# Patient Record
Sex: Male | Born: 1940
Health system: Southern US, Community
[De-identification: ages and names within clinical notes are randomized; demographics above are authoritative.]

## PROBLEM LIST (undated history)

## (undated) DIAGNOSIS — K219 Gastro-esophageal reflux disease without esophagitis: Secondary | ICD-10-CM

## (undated) DIAGNOSIS — I482 Chronic atrial fibrillation, unspecified: Secondary | ICD-10-CM

## (undated) DIAGNOSIS — N183 Chronic kidney disease, stage 3 unspecified: Secondary | ICD-10-CM

## (undated) DIAGNOSIS — R053 Chronic cough: Secondary | ICD-10-CM

## (undated) DIAGNOSIS — J9601 Acute respiratory failure with hypoxia: Secondary | ICD-10-CM

## (undated) DIAGNOSIS — I5032 Chronic diastolic (congestive) heart failure: Secondary | ICD-10-CM

## (undated) DIAGNOSIS — G4733 Obstructive sleep apnea (adult) (pediatric): Secondary | ICD-10-CM

## (undated) DIAGNOSIS — D649 Anemia, unspecified: Secondary | ICD-10-CM

## (undated) DIAGNOSIS — I469 Cardiac arrest, cause unspecified: Secondary | ICD-10-CM

## (undated) DIAGNOSIS — E662 Morbid (severe) obesity with alveolar hypoventilation: Secondary | ICD-10-CM

## (undated) DIAGNOSIS — I442 Atrioventricular block, complete: Secondary | ICD-10-CM

## (undated) DIAGNOSIS — R05 Cough: Secondary | ICD-10-CM

## (undated) DIAGNOSIS — Z95810 Presence of automatic (implantable) cardiac defibrillator: Secondary | ICD-10-CM

## (undated) DIAGNOSIS — I10 Essential (primary) hypertension: Secondary | ICD-10-CM

## (undated) DIAGNOSIS — Z87828 Personal history of other (healed) physical injury and trauma: Secondary | ICD-10-CM

## (undated) DIAGNOSIS — E785 Hyperlipidemia, unspecified: Secondary | ICD-10-CM

## (undated) DIAGNOSIS — I872 Venous insufficiency (chronic) (peripheral): Secondary | ICD-10-CM

## (undated) DIAGNOSIS — Z8781 Personal history of (healed) traumatic fracture: Secondary | ICD-10-CM

## (undated) HISTORY — DX: Chronic cough: R05.3

## (undated) HISTORY — DX: Personal history of other (healed) physical injury and trauma: Z87.828

## (undated) HISTORY — PX: TRANSTHORACIC ECHOCARDIOGRAM: SHX275

## (undated) HISTORY — DX: Essential (primary) hypertension: I10

## (undated) HISTORY — DX: Hyperlipidemia, unspecified: E78.5

## (undated) HISTORY — DX: Chronic kidney disease, stage 3 (moderate): N18.3

## (undated) HISTORY — PX: INSERT / REPLACE / REMOVE PACEMAKER: SUR710

## (undated) HISTORY — DX: Chronic diastolic (congestive) heart failure: I50.32

## (undated) HISTORY — DX: Venous insufficiency (chronic) (peripheral): I87.2

## (undated) HISTORY — DX: Atrioventricular block, complete: I44.2

## (undated) HISTORY — DX: Chronic atrial fibrillation, unspecified: I48.20

## (undated) HISTORY — PX: AV NODE ABLATION: EP1193

## (undated) HISTORY — DX: Cough: R05

## (undated) HISTORY — DX: Anemia, unspecified: D64.9

## (undated) HISTORY — DX: Cardiac arrest, cause unspecified: I46.9

## (undated) HISTORY — DX: Personal history of (healed) traumatic fracture: Z87.81

## (undated) HISTORY — DX: Gastro-esophageal reflux disease without esophagitis: K21.9

## (undated) HISTORY — DX: Presence of automatic (implantable) cardiac defibrillator: Z95.810

## (undated) HISTORY — DX: Obstructive sleep apnea (adult) (pediatric): G47.33

## (undated) HISTORY — DX: Morbid (severe) obesity with alveolar hypoventilation: E66.2

## (undated) HISTORY — DX: Chronic kidney disease, stage 3 unspecified: N18.30

---

## 1978-05-21 DIAGNOSIS — Z8781 Personal history of (healed) traumatic fracture: Secondary | ICD-10-CM

## 1978-05-21 HISTORY — DX: Personal history of (healed) traumatic fracture: Z87.81

## 1998-05-21 DIAGNOSIS — I469 Cardiac arrest, cause unspecified: Secondary | ICD-10-CM

## 1998-05-21 HISTORY — DX: Cardiac arrest, cause unspecified: I46.9

## 1998-06-29 ENCOUNTER — Encounter: Payer: Self-pay | Admitting: Emergency Medicine

## 1998-06-29 ENCOUNTER — Inpatient Hospital Stay (HOSPITAL_COMMUNITY): Admission: EM | Admit: 1998-06-29 | Discharge: 1998-07-22 | Payer: Self-pay | Admitting: Emergency Medicine

## 1998-07-01 ENCOUNTER — Encounter: Payer: Self-pay | Admitting: Internal Medicine

## 1998-07-02 ENCOUNTER — Encounter: Payer: Self-pay | Admitting: Cardiovascular Disease

## 1998-07-13 ENCOUNTER — Encounter: Payer: Self-pay | Admitting: Pulmonary Disease

## 1999-04-09 ENCOUNTER — Encounter: Payer: Self-pay | Admitting: Emergency Medicine

## 1999-04-10 ENCOUNTER — Inpatient Hospital Stay (HOSPITAL_COMMUNITY): Admission: EM | Admit: 1999-04-10 | Discharge: 1999-04-10 | Payer: Self-pay | Admitting: Emergency Medicine

## 1999-05-21 ENCOUNTER — Emergency Department (HOSPITAL_COMMUNITY): Admission: EM | Admit: 1999-05-21 | Discharge: 1999-05-21 | Payer: Self-pay | Admitting: Emergency Medicine

## 1999-05-30 ENCOUNTER — Ambulatory Visit (HOSPITAL_COMMUNITY): Admission: RE | Admit: 1999-05-30 | Discharge: 1999-05-31 | Payer: Self-pay | Admitting: Internal Medicine

## 2000-11-27 ENCOUNTER — Encounter: Admission: RE | Admit: 2000-11-27 | Discharge: 2000-11-27 | Payer: Self-pay | Admitting: Urology

## 2000-11-27 ENCOUNTER — Encounter: Payer: Self-pay | Admitting: Urology

## 2003-02-02 ENCOUNTER — Ambulatory Visit (HOSPITAL_COMMUNITY): Admission: RE | Admit: 2003-02-02 | Discharge: 2003-02-03 | Payer: Self-pay | Admitting: Cardiovascular Disease

## 2005-10-03 ENCOUNTER — Ambulatory Visit (HOSPITAL_COMMUNITY): Admission: RE | Admit: 2005-10-03 | Discharge: 2005-10-03 | Payer: Self-pay | Admitting: Cardiovascular Disease

## 2006-05-21 HISTORY — PX: COLONOSCOPY W/ POLYPECTOMY: SHX1380

## 2006-05-21 HISTORY — PX: ESOPHAGOGASTRODUODENOSCOPY: SHX1529

## 2006-08-02 ENCOUNTER — Encounter (INDEPENDENT_AMBULATORY_CARE_PROVIDER_SITE_OTHER): Payer: Self-pay | Admitting: Specialist

## 2006-08-02 ENCOUNTER — Ambulatory Visit (HOSPITAL_COMMUNITY): Admission: RE | Admit: 2006-08-02 | Discharge: 2006-08-02 | Payer: Self-pay | Admitting: Gastroenterology

## 2006-10-30 ENCOUNTER — Ambulatory Visit: Payer: Self-pay | Admitting: Internal Medicine

## 2006-11-04 ENCOUNTER — Ambulatory Visit: Payer: Self-pay | Admitting: Internal Medicine

## 2006-11-04 LAB — CONVERTED CEMR LAB
BUN: 15 mg/dL (ref 6–23)
Basophils Absolute: 0 10*3/uL (ref 0.0–0.1)
Basophils Relative: 0 % (ref 0.0–1.0)
CO2: 28 meq/L (ref 19–32)
Calcium: 9.7 mg/dL (ref 8.4–10.5)
Chloride: 105 meq/L (ref 96–112)
Creatinine, Ser: 1.1 mg/dL (ref 0.4–1.5)
Eosinophils Absolute: 0.1 10*3/uL (ref 0.0–0.6)
Eosinophils Relative: 1.7 % (ref 0.0–5.0)
GFR calc Af Amer: 87 mL/min
GFR calc non Af Amer: 72 mL/min
Glucose, Bld: 82 mg/dL (ref 70–99)
HCT: 41.8 % (ref 39.0–52.0)
Hemoglobin: 14.3 g/dL (ref 13.0–17.0)
INR: 2.5 — ABNORMAL HIGH (ref 0.9–2.0)
Lymphocytes Relative: 26.2 % (ref 12.0–46.0)
MCHC: 34.2 g/dL (ref 30.0–36.0)
MCV: 87.1 fL (ref 78.0–100.0)
Monocytes Absolute: 0.7 10*3/uL (ref 0.2–0.7)
Monocytes Relative: 8.7 % (ref 3.0–11.0)
Neutro Abs: 5.5 10*3/uL (ref 1.4–7.7)
Neutrophils Relative %: 63.4 % (ref 43.0–77.0)
Platelets: 243 10*3/uL (ref 150–400)
Potassium: 4.4 meq/L (ref 3.5–5.1)
Prothrombin Time: 19.9 s — ABNORMAL HIGH (ref 10.0–14.0)
RBC: 4.81 M/uL (ref 4.22–5.81)
RDW: 12.5 % (ref 11.5–14.6)
Sodium: 138 meq/L (ref 135–145)
WBC: 8.6 10*3/uL (ref 4.5–10.5)
aPTT: 47.3 s — ABNORMAL HIGH (ref 26.5–36.5)

## 2006-11-08 ENCOUNTER — Ambulatory Visit: Payer: Self-pay | Admitting: Internal Medicine

## 2006-11-08 ENCOUNTER — Observation Stay (HOSPITAL_COMMUNITY): Admission: RE | Admit: 2006-11-08 | Discharge: 2006-11-08 | Payer: Self-pay | Admitting: Internal Medicine

## 2007-12-07 ENCOUNTER — Emergency Department (HOSPITAL_COMMUNITY): Admission: EM | Admit: 2007-12-07 | Discharge: 2007-12-07 | Payer: Self-pay | Admitting: Emergency Medicine

## 2007-12-09 ENCOUNTER — Emergency Department (HOSPITAL_COMMUNITY): Admission: EM | Admit: 2007-12-09 | Discharge: 2007-12-09 | Payer: Self-pay | Admitting: Emergency Medicine

## 2009-02-04 ENCOUNTER — Ambulatory Visit: Payer: Self-pay | Admitting: Family Medicine

## 2009-02-04 DIAGNOSIS — E785 Hyperlipidemia, unspecified: Secondary | ICD-10-CM

## 2009-02-04 DIAGNOSIS — I10 Essential (primary) hypertension: Secondary | ICD-10-CM

## 2009-02-04 DIAGNOSIS — M7040 Prepatellar bursitis, unspecified knee: Secondary | ICD-10-CM | POA: Insufficient documentation

## 2009-02-04 DIAGNOSIS — S99929A Unspecified injury of unspecified foot, initial encounter: Secondary | ICD-10-CM

## 2009-02-04 DIAGNOSIS — S99919A Unspecified injury of unspecified ankle, initial encounter: Secondary | ICD-10-CM

## 2009-02-04 DIAGNOSIS — S8990XA Unspecified injury of unspecified lower leg, initial encounter: Secondary | ICD-10-CM

## 2009-02-04 DIAGNOSIS — S40029A Contusion of unspecified upper arm, initial encounter: Secondary | ICD-10-CM

## 2009-02-04 HISTORY — DX: Essential (primary) hypertension: I10

## 2009-02-05 ENCOUNTER — Encounter: Payer: Self-pay | Admitting: Family Medicine

## 2010-04-10 ENCOUNTER — Ambulatory Visit: Payer: Self-pay | Admitting: Emergency Medicine

## 2010-04-10 DIAGNOSIS — M79609 Pain in unspecified limb: Secondary | ICD-10-CM

## 2010-04-10 DIAGNOSIS — M25473 Effusion, unspecified ankle: Secondary | ICD-10-CM

## 2010-04-10 DIAGNOSIS — M25476 Effusion, unspecified foot: Secondary | ICD-10-CM
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
## 2010-09-11 ENCOUNTER — Other Ambulatory Visit: Payer: Self-pay | Admitting: Cardiovascular Disease

## 2010-09-11 ENCOUNTER — Ambulatory Visit
Admission: RE | Admit: 2010-09-11 | Discharge: 2010-09-11 | Disposition: A | Discharge status: Home / Self Care | Payer: Medicare Other | Source: Ambulatory Visit | Attending: Cardiovascular Disease | Admitting: Cardiovascular Disease

## 2010-09-11 DIAGNOSIS — M79604 Pain in right leg: Secondary | ICD-10-CM

## 2010-09-11 DIAGNOSIS — M545 Low back pain: Secondary | ICD-10-CM

## 2010-10-03 NOTE — Letter (Signed)
October 30, 2006    Ruben Walker, M.D.  [phone] [address], [address]  [place], [place] [address]   RE:  Ruben Walker  MRN:  [id number]  /  DOB:  Oct 08, 1940   Dear Ruben Walker,   It was a pleasure to see Ruben Walker at your request for ICD generator  replacement consideration.   As you know, he is a 70 year old gentleman who has a history of sudden  cardiac death, for which he underwent ICD implantation in the year 2000.  This occurred in the setting of atrial fibrillation.  He had multiple  defibrillator discharges following his initial ICD and underwent AV  junction ablation.  Following this, he has had no further defibrillator  discharges.  In September, 2004, he underwent an ICD generator  replacement.  Unfortunately, his defibrillator has not lasted well, and  he has reached ERI at approximately 3-1/2 years.  He has had  intercurrent evaluation of his LV function with an echo in 2006 that was  near-normal.  He had a Myoview scan done in February, 2008.  There was  no significant ischemia.  It was felt to be a low risk scan.  I did not  see a measurement of left ventricular function on that scan, although  your note says that the LV function was good.  I presume you have maybe  had a chance to look at that.   He has modest exercise impairment.  He does not have peripheral edema or  nocturnal dyspnea or orthopnea.   His review of systems suggests that he has had a heart attack in the  past, but I do not have record of this, although he did have a history  of what sounds like reverse cardiomyopathy.   PAST MEDICAL HISTORY:  In addition to the above is notable for:  1. Chronic venous insufficiency.  2. Dyslipidemia.  3. GE reflux disease.  4. Obesity.   MEDICATIONS:  1. Potassium.  2. Diovan 160.  3. Coumadin 5.  4. Lasix 20.  5. Nexium.  6. Metoprolol 50.  7. Isosorbide 30 mg daily.   He has no known drug allergies.   SOCIAL HISTORY:  He is not married.  He has no  children.  He does not  use cigarettes, alcohol, or recreational drugs.  He does drink a fair  amount of coffee.  He works in the office doing computer, telephone, and  paperwork.   His review of systems, apart from the above and across multiple organ  systems, is noncontributory.   PHYSICAL EXAMINATION:  VITAL SIGNS:  His blood pressure was 132/76 with  a pulse of 74.  His weight was 226 pounds.  HEENT:  No icterus or xanthomata.  NECK:  Neck veins are flat.  Carotids are brisk and full bilaterally  without bruits.  BACK:  Without kyphosis or scoliosis.  LUNGS:  Clear.  CARDIAC:  Heart sounds were irregular.  ABDOMEN:  Soft with active bowel sounds without midline pulsation or  hepatomegaly.  EXTREMITIES:  Femoral pulses were 2+.  Distal pulses were intact.  There  is no clubbing or cyanosis.  There is 1+ edema.  NEUROLOGIC:  Grossly normal.   Electrocardiogram today demonstrates atrial fibrillation with complete  heart block and ventricular pacing.   Interrogation of his Guidant defibrillator demonstrated that he reached  ERI on April 6th.   IMPRESSION:  1. Aborted sudden cardiac death.  2. Status post Guidant implantable cardioverter/defibrillator for the  above, now at [facility] (elective replacement indicator) with relatively      rapid battery depletion.  3. Resolved nonischemic cardiomyopathy.  4. Resolved congestive heart failure.  5. Device dependence.  6. Status post atrioventricular junction ablation contributing to #5      with permanent atrial fibrillation.   Ruben Walker, Ruben Walker, as you appropriately have discerned, is time for a  device generator replacement.  I have reviewed this with him today.  The  big issue is infection, which I estimate at 3-4%.  This is a second  change-out.   He is agreeable and willing to proceed.   Thank you very much for having your office forward all of the relative  information today, it made it much easier.   I will let you know  how things go.    Sincerely,      Ruben Salvia, MD, [facility]  Electronically Signed    Ruben Walker/MedQ  DD: 10/30/2006  DT: 10/30/2006  Job #: [id number]

## 2010-10-03 NOTE — Discharge Summary (Signed)
Ruben Walker, Ruben Walker               ACCOUNT NO.:  [id number]   MEDICAL RECORD NO.:  [id number]          PATIENT TYPE:  INP   LOCATION:  2852                         FACILITY:  [facility]   PHYSICIAN:  Maple Mirza, PA   DATE OF BIRTH:  [birth year]   DATE OF ADMISSION:  11/08/2006  DATE OF DISCHARGE:  11/08/2006                               DISCHARGE SUMMARY   ALLERGIES:  This patient has no known drug allergies.   This dictation and examination greater than 35 minutes.   FINAL DIAGNOSIS:  1. Ischemic cardiomyopathy/sudden cardiac death.  Defibrillator      implanted for same at end-of-life at present.   SECONDARY DIAGNOSIS.:  1. The patient has a history of sudden cardiac death.  2. ICD implanted 2000 for sudden cardiac death.  3. Generator change 2003-02-15.  4. As status post AV node ablation.  The patient is device dependent  5. Permanent atrial fibrillation on Coumadin  6. Myoview study February 2008 negative for ischemia  7. Dyslipidemia.  8. GERD  9. New York Heart Association class II  chronic, systolic congestive      heart failure near normal left ventricular function.   PROCEDURE:  1. 11/14/2006, explantation of existing cardioverter-defibrillator  at      end-of-life. Implantation of [employer] Confient single      chamber cardioverter defibrillator June 20, Dr. Graciela Husbands, no      postprocedural complications.   DISPOSITION:  Patient discharging November 08, 2006.  He is asked to the  morning of June 21, Saturday to remove the bandage and to leave the  incision open to the air.  He is to keep the incision dry for next 7  days, to sponge bathe until Friday June 27.   MEDICATIONS:  1. He goes home on a new medication, Keflex 500 mg antibiotic 1 tablet      1/2 hour before breakfast, lunch, dinner, bedtime for next 7 days.  2. Diovan 160 mg daily  3. Potassium chloride 10 mEq twice daily  4. Coumadin 5 mg as directed  5. Lasix 20 mg daily  6. Nexium 40 mg  daily  7. Metoprolol succinate as before this admission  8. Isosorbide 1/2 tablet at bedtime.   FOLLOW UP:  He follows up with Dr. Alanda Amass June 26 at 2 o'clock      Maple Mirza, [place]     GM/MEDQ  D:  11/08/2006  T:  11/08/2006  Job:  [id number]   cc:   Duke Salvia, MD, [facility]  [name], M.D.

## 2010-10-06 NOTE — Procedures (Signed)
[place]  Patient:    Ruben Walker                       MRN: [id number] Proc. Date: 05/30/99 Adm. Date:  [id number] Disc. Date: [id number] Attending:  Nathen May CC:         Electrophysiological Laboratory             [name], M.D.             [place] Office - Attention:  Delsa Grana                           Procedure Report  Optical Disk:  #[id number]  PREOPERATIVE DIAGNOSIS:  Atrial fibrillation with recurrent defibrillator discharges.  POSTOPERATIVE DIAGNOSIS:  Atrial fibrillation with recurrent defibrillator discharges.  PROCEDURE:  Atrial ventricular junction ablation with prior defibrillator implantation for abort of sudden cardiac death.  CARDIOLOGIST:  Nathen May, M.D. F.A.C.C.  DESCRIPTION OF PROCEDURE:  The cardiac catheterization was performed with local  anesthesia and conscious sedation.  Noninvasive blood pressure monitoring and transcutaneous oxygen saturation monitoring were performed continuously throughout the procedure.  Following the insertion of the catheters, the stimulation protocol, including measurement of the HV interval and radiofrequency applications. Following the procedure the catheters were removed.  Hemostasis was obtained and the patient was transferred to the holding area in stable condition.  CATHETERS:  A 7-French quadripolar catheter was inserted via the right femoral ein using a Riley Kill) sheath to ______ at the AV junction.  RESULTS: Initial rhythm:  Atrial fibrillation, cycle length 864 msec. QRS duration:  120 msec. QT interval:   356 msec. HV interval:    61 msec.  Radiofrequency energy:  A total of 100 seconds of radiofrequency energy is applied in two applications to sites where the His bundle potential was identified. Antegrade conduction over the AV junction was successfully interrupted.  The patient had an escape rate of 45 beats per minute.   Interrogation of the defibrillator following the procedure demonstrated no reset.  IMPRESSION: 1. Atrial fibrillation with rapid ventricular response, resulting in defibrillator    discharge. 2. Status post aborted sudden cardiac death, status post Guidant [phone]    implantable cardioverter defibrillator implantation. 3. Successful atrial ventricular junction ablation. 4. Escape rate of 45 beats per minute.  SUMMARY:  In conclusion, the results of the electrophysiological testing identified antegrade conduction over the AV node which was successfully interrupted by the  ablation of the AV junction.  The patient has a stable escape rate of 45 beats er minute, and the patients defibrillator was reprogrammed, at an upper rate of 90  beats per minute. DD:  08/09/99 TD:  08/09/99 Job: [id number]
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
## 2010-10-06 NOTE — Op Note (Signed)
Ruben Walker, Ruben Walker NO.:  [id number]   MEDICAL RECORD NO.:  [id number]          PATIENT TYPE:  INP   LOCATION:  2852                         FACILITY:  [facility]   PHYSICIAN:  Duke Salvia, MD, FACCDATE OF BIRTH:  1941/01/30   DATE OF PROCEDURE:  01/21/2007  DATE OF DISCHARGE:  11/08/2006                               OPERATIVE REPORT   PREOPERATIVE DIAGNOSIS:  Aborted sudden cardiac death previously  implanted defibrillator now at end of life.   POSTOPERATIVE DIAGNOSIS:  Aborted sudden cardiac death previously  implanted defibrillator now at end of life.   PROCEDURE:  Explantation of a previously implanted device, implantation  of new device and intraoperative defibrillation threshold testing.   Following obtaining informed consent, the patient was brought to the  electrophysiology laboratory and placed on the fluoroscopic table in  supine position.  After routine prep and drape of the left upper chest,  lidocaine was infiltrated along the line of previous incision carried  down to layer of the device pocket using sharp dissection and  electrocautery.  The pocket was opened.  Hemostasis was obtained.  The  device was explanted.  Interrogation of the previously implanted  defibrillator lead demonstrated an R wave of 12.6 with pace impedance of  1050 ohms, a threshold 0.4 volts at 0.4 milliseconds.  This lead was  then secured to a [employer] device, model [id number], serial  number [id number].  Through the device the bipolar rhythm was noted to be  100% paced, impedances were as noted.   The patient then was submitted for defibrillation threshold testing.  Ventricular fibrillation was induced via the T-wave shock.  After a  total duration of 6.5 seconds 26 joules shock was delivered through a  measured resistance of 40 ohms terminating ventricular fibrillation and  restoring ventricular lead paced rhythm.  The pocket was copiously  irrigated with  antibiotic containing saline solution.  Hemostasis was  assured.  The leads and the pulse generator were placed in the pocket.  The wound was closed in three layers in normal fashion.  The wound was  washed, dried and a benzoin Steri-Strip dressing was applied.  Needle  counts, sponge counts and instrument counts were correct at the end of  the procedure according to staff.  The patient tolerated the procedure  without apparent complication.      Duke Salvia, MD, [facility]  Electronically Signed     SCK/MEDQ  D:  01/21/2007  T:  01/22/2007  Job:  [id number]   cc:   Electrophys lab  [place] pacemaker cl  [name], M.D.
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
## 2010-10-06 NOTE — Op Note (Signed)
Ruben Walker, Ruben Walker               ACCOUNT NO.:  [id number]   MEDICAL RECORD NO.:  [id number]          PATIENT TYPE:  AMB   LOCATION:  ENDO                         FACILITY:  [facility]   PHYSICIAN:  Bernette Redbird, M.D.   DATE OF BIRTH:  25-Aug-1940   DATE OF PROCEDURE:  08/02/2006  DATE OF DISCHARGE:                               OPERATIVE REPORT   PROCEDURE:  Colonoscopy with biopsy.   INDICATION:  A 70 year old gentleman for initial screening colonoscopy,  without worrisome risk factors or symptoms, apart from the  remote  family history of colon cancer in a maternal aunt.   FINDINGS:  Two diminutive sessile polyps.   DESCRIPTION OF PROCEDURE:  The nature, purpose, and risks of the  procedure been discussed with the patient who provided written consent.   SEDATION:  For this procedure and the upper endoscopy which preceded it  totaled:  Fentanyl 75 mcg and Versed 7.5 mg IV without arrhythmias or  desaturation.   DIGITAL EXAM:  Of prostate was normal.   DESCRIPTION OF PROCEDURE:  This procedure was done with the patient  remaining on his Coumadin; INR was approximately 2.9 several days prior  to the procedure.  We did not deactivate his implantable defibrillator.   The Pentax adult video colonoscope was advanced around the colon to the  base of the cecum, then turning the patient into the supine position and  right lateral decubitus position at the base of the cecum where there  was a small sessile polyp removed by a couple of cold biopsies.  At 40  cm there was an additional diminutive sessile polyp removed by several  cold biopsies, to a point of nearly complete excision.  There was  transient oozing, at that site, which was observed and noted to have  stopped after a minute or two.   This was otherwise a normal examination.  No other polyps were seen,  specifically no large polyps, and there was no evidence of cancer,  colitis or vascular ectasia or diverticular disease.   Retroflexion in  the rectum and reinspection of the rectosigmoid was unremarkable.  The  patient tolerated the procedure well and there no apparent  complications.   IMPRESSION:  1. Diminutive colon polyps removed as described above (Code 211.3)  2. Remote family history of colon cancer.   PLAN:  Await pathology results.           ______________________________  Bernette Redbird, M.D.     RB/MEDQ  D:  08/02/2006  T:  08/03/2006  Job:  [id number]   cc:   [name], M.D.

## 2010-10-06 NOTE — Op Note (Signed)
NAMEELWARD, NOCERA               ACCOUNT NO.:  [id number]   MEDICAL RECORD NO.:  [id number]          PATIENT TYPE:  AMB   LOCATION:  ENDO                         FACILITY:  [facility]   PHYSICIAN:  Bernette Redbird, M.D.   DATE OF BIRTH:  [birth year]   DATE OF PROCEDURE:  08/02/2006  DATE OF DISCHARGE:                               OPERATIVE REPORT   PROCEDURE:  Upper endoscopy.   INDICATIONS:  Xiphoid pain in a 70 year old gentleman.  Rule out reflex  disease, hiatal hernia, etcetera.   FINDINGS:  Normal exam.   PROCEDURE:  The nature, purpose, and risks of the procedure had been  discussed with the patient and provided written consent.   SEDATION:  Fentanyl 50 mcg and Versed 5 mg IV without arrhythmias or  desaturation.   Note that this procedure was done with the patient on his Coumadin (INR  was about 2.9 as of several days ago); and his implantable defibrillator  was not deactivated for this procedure.   DESCRIPTION OF PROCEDURE:  The Pentax video endoscope was passed under  direct vision.  The vocal cords looked normal.  The esophagus was  entered carefully, and cautiously under direct vision; taking care not  to traumatize the hypopharyngeal tissues.  The esophagus was  endoscopically normal without evidence of reflux esophagitis, Barrett's  esophagus, varices infection, neoplasia, or any ring stricture or hiatal  hernia.   The stomach contained no significant residual and had normal mucosa  without evidence of gastritis, erosions, ulcers, polyps or masses; and  the pylorus, duodenal bulb, and second duodenum looked normal.  A  retroflex view of the cardia was normal.   The scope was removed from the patient.  He tolerated the procedure  well; and there were no apparent complications.   IMPRESSION:  Xiphoid-area pain without evident explanation on current  examination (Code 789.06).   PLAN:  Clinical follow up of xiphoid region symptoms.     ______________________________  Bernette Redbird, M.D.     RB/MEDQ  D:  08/02/2006  T:  08/03/2006  Job:  [id number]   cc:   [name], M.D.

## 2011-02-16 LAB — DIFFERENTIAL
Basophils Absolute: 0
Basophils Relative: 0
Monocytes Relative: 7
Neutro Abs: 8.8 — ABNORMAL HIGH
Neutrophils Relative %: 79 — ABNORMAL HIGH

## 2011-02-16 LAB — POCT I-STAT, CHEM 8
HCT: 41
Hemoglobin: 13.9
Sodium: 136
TCO2: 24

## 2011-02-16 LAB — PROTIME-INR
INR: 2.3 — ABNORMAL HIGH
Prothrombin Time: 27.2 — ABNORMAL HIGH

## 2011-02-16 LAB — CBC
MCHC: 33.6
Platelets: 233
RBC: 4.62
WBC: 11.1 — ABNORMAL HIGH

## 2011-02-16 LAB — APTT: aPTT: 45 — ABNORMAL HIGH

## 2011-03-07 LAB — PROTIME-INR
INR: 2.1 — ABNORMAL HIGH
Prothrombin Time: 24.4 — ABNORMAL HIGH

## 2011-06-01 DIAGNOSIS — I4891 Unspecified atrial fibrillation: Secondary | ICD-10-CM | POA: Diagnosis not present

## 2011-06-01 DIAGNOSIS — Z7901 Long term (current) use of anticoagulants: Secondary | ICD-10-CM | POA: Diagnosis not present

## 2011-06-29 DIAGNOSIS — I4891 Unspecified atrial fibrillation: Secondary | ICD-10-CM | POA: Diagnosis not present

## 2011-06-29 DIAGNOSIS — Z7901 Long term (current) use of anticoagulants: Secondary | ICD-10-CM | POA: Diagnosis not present

## 2011-07-27 DIAGNOSIS — Z7901 Long term (current) use of anticoagulants: Secondary | ICD-10-CM | POA: Diagnosis not present

## 2011-07-27 DIAGNOSIS — I4891 Unspecified atrial fibrillation: Secondary | ICD-10-CM | POA: Diagnosis not present

## 2011-08-31 DIAGNOSIS — R5383 Other fatigue: Secondary | ICD-10-CM | POA: Diagnosis not present

## 2011-08-31 DIAGNOSIS — M109 Gout, unspecified: Secondary | ICD-10-CM | POA: Diagnosis not present

## 2011-08-31 DIAGNOSIS — I4891 Unspecified atrial fibrillation: Secondary | ICD-10-CM | POA: Diagnosis not present

## 2011-08-31 DIAGNOSIS — I495 Sick sinus syndrome: Secondary | ICD-10-CM | POA: Diagnosis not present

## 2011-08-31 DIAGNOSIS — Z7901 Long term (current) use of anticoagulants: Secondary | ICD-10-CM | POA: Diagnosis not present

## 2011-08-31 DIAGNOSIS — I10 Essential (primary) hypertension: Secondary | ICD-10-CM | POA: Diagnosis not present

## 2011-08-31 DIAGNOSIS — Z79899 Other long term (current) drug therapy: Secondary | ICD-10-CM | POA: Diagnosis not present

## 2011-09-03 DIAGNOSIS — K224 Dyskinesia of esophagus: Secondary | ICD-10-CM | POA: Diagnosis not present

## 2011-09-03 DIAGNOSIS — Z79899 Other long term (current) drug therapy: Secondary | ICD-10-CM | POA: Diagnosis not present

## 2011-09-03 DIAGNOSIS — K219 Gastro-esophageal reflux disease without esophagitis: Secondary | ICD-10-CM | POA: Diagnosis not present

## 2011-09-24 DIAGNOSIS — R0989 Other specified symptoms and signs involving the circulatory and respiratory systems: Secondary | ICD-10-CM | POA: Diagnosis not present

## 2011-09-24 DIAGNOSIS — R0609 Other forms of dyspnea: Secondary | ICD-10-CM | POA: Diagnosis not present

## 2011-09-24 DIAGNOSIS — G4710 Hypersomnia, unspecified: Secondary | ICD-10-CM | POA: Diagnosis not present

## 2011-09-24 DIAGNOSIS — G4733 Obstructive sleep apnea (adult) (pediatric): Secondary | ICD-10-CM | POA: Diagnosis not present

## 2011-10-01 DIAGNOSIS — Z7901 Long term (current) use of anticoagulants: Secondary | ICD-10-CM | POA: Diagnosis not present

## 2011-10-01 DIAGNOSIS — I4891 Unspecified atrial fibrillation: Secondary | ICD-10-CM | POA: Diagnosis not present

## 2011-10-29 DIAGNOSIS — I4891 Unspecified atrial fibrillation: Secondary | ICD-10-CM | POA: Diagnosis not present

## 2011-10-29 DIAGNOSIS — Z7901 Long term (current) use of anticoagulants: Secondary | ICD-10-CM | POA: Diagnosis not present

## 2011-11-26 DIAGNOSIS — Z7901 Long term (current) use of anticoagulants: Secondary | ICD-10-CM | POA: Diagnosis not present

## 2011-11-26 DIAGNOSIS — I4891 Unspecified atrial fibrillation: Secondary | ICD-10-CM | POA: Diagnosis not present

## 2012-01-03 DIAGNOSIS — R39198 Other difficulties with micturition: Secondary | ICD-10-CM | POA: Diagnosis not present

## 2012-01-03 DIAGNOSIS — Z4502 Encounter for adjustment and management of automatic implantable cardiac defibrillator: Secondary | ICD-10-CM | POA: Diagnosis not present

## 2012-01-03 DIAGNOSIS — Z7901 Long term (current) use of anticoagulants: Secondary | ICD-10-CM | POA: Diagnosis not present

## 2012-01-03 DIAGNOSIS — Z79899 Other long term (current) drug therapy: Secondary | ICD-10-CM | POA: Diagnosis not present

## 2012-01-03 DIAGNOSIS — I4891 Unspecified atrial fibrillation: Secondary | ICD-10-CM | POA: Diagnosis not present

## 2012-01-03 DIAGNOSIS — E782 Mixed hyperlipidemia: Secondary | ICD-10-CM | POA: Diagnosis not present

## 2012-01-03 DIAGNOSIS — R5381 Other malaise: Secondary | ICD-10-CM | POA: Diagnosis not present

## 2012-01-31 DIAGNOSIS — I4891 Unspecified atrial fibrillation: Secondary | ICD-10-CM | POA: Diagnosis not present

## 2012-01-31 DIAGNOSIS — Z7901 Long term (current) use of anticoagulants: Secondary | ICD-10-CM | POA: Diagnosis not present

## 2012-02-21 DIAGNOSIS — Z7901 Long term (current) use of anticoagulants: Secondary | ICD-10-CM | POA: Diagnosis not present

## 2012-02-21 DIAGNOSIS — I4891 Unspecified atrial fibrillation: Secondary | ICD-10-CM | POA: Diagnosis not present

## 2012-03-20 DIAGNOSIS — I4891 Unspecified atrial fibrillation: Secondary | ICD-10-CM | POA: Diagnosis not present

## 2012-03-20 DIAGNOSIS — Z7901 Long term (current) use of anticoagulants: Secondary | ICD-10-CM | POA: Diagnosis not present

## 2012-04-15 DIAGNOSIS — I4891 Unspecified atrial fibrillation: Secondary | ICD-10-CM | POA: Diagnosis not present

## 2012-04-15 DIAGNOSIS — Z7901 Long term (current) use of anticoagulants: Secondary | ICD-10-CM | POA: Diagnosis not present

## 2012-04-25 DIAGNOSIS — R609 Edema, unspecified: Secondary | ICD-10-CM | POA: Diagnosis not present

## 2012-04-25 DIAGNOSIS — I4891 Unspecified atrial fibrillation: Secondary | ICD-10-CM | POA: Diagnosis not present

## 2012-04-25 DIAGNOSIS — M109 Gout, unspecified: Secondary | ICD-10-CM | POA: Diagnosis not present

## 2012-04-25 DIAGNOSIS — R5383 Other fatigue: Secondary | ICD-10-CM | POA: Diagnosis not present

## 2012-04-25 DIAGNOSIS — I495 Sick sinus syndrome: Secondary | ICD-10-CM | POA: Diagnosis not present

## 2012-04-25 DIAGNOSIS — R5381 Other malaise: Secondary | ICD-10-CM | POA: Diagnosis not present

## 2012-04-25 DIAGNOSIS — R6889 Other general symptoms and signs: Secondary | ICD-10-CM | POA: Diagnosis not present

## 2012-04-25 DIAGNOSIS — I509 Heart failure, unspecified: Secondary | ICD-10-CM | POA: Diagnosis not present

## 2012-05-06 ENCOUNTER — Other Ambulatory Visit (HOSPITAL_COMMUNITY): Payer: Self-pay | Admitting: Cardiovascular Disease

## 2012-05-06 DIAGNOSIS — I447 Left bundle-branch block, unspecified: Secondary | ICD-10-CM

## 2012-05-06 DIAGNOSIS — I509 Heart failure, unspecified: Secondary | ICD-10-CM

## 2012-05-06 DIAGNOSIS — Z95810 Presence of automatic (implantable) cardiac defibrillator: Secondary | ICD-10-CM

## 2012-05-12 DIAGNOSIS — I4891 Unspecified atrial fibrillation: Secondary | ICD-10-CM | POA: Diagnosis not present

## 2012-05-12 DIAGNOSIS — Z7901 Long term (current) use of anticoagulants: Secondary | ICD-10-CM | POA: Diagnosis not present

## 2012-05-21 HISTORY — PX: CARDIOVASCULAR STRESS TEST: SHX262

## 2012-05-23 ENCOUNTER — Ambulatory Visit (HOSPITAL_COMMUNITY)
Admission: RE | Admit: 2012-05-23 | Discharge: 2012-05-23 | Disposition: A | Discharge status: Home / Self Care | Payer: Medicare Other | Source: Ambulatory Visit | Attending: Cardiovascular Disease | Admitting: Cardiovascular Disease

## 2012-05-23 DIAGNOSIS — I447 Left bundle-branch block, unspecified: Secondary | ICD-10-CM

## 2012-05-23 DIAGNOSIS — I509 Heart failure, unspecified: Secondary | ICD-10-CM | POA: Diagnosis not present

## 2012-05-23 DIAGNOSIS — Z95810 Presence of automatic (implantable) cardiac defibrillator: Secondary | ICD-10-CM

## 2012-05-23 DIAGNOSIS — I4891 Unspecified atrial fibrillation: Secondary | ICD-10-CM | POA: Diagnosis not present

## 2012-05-23 DIAGNOSIS — Z7901 Long term (current) use of anticoagulants: Secondary | ICD-10-CM | POA: Diagnosis not present

## 2012-05-23 MED ORDER — TECHNETIUM TC 99M SESTAMIBI GENERIC - CARDIOLITE
11.0000 | Freq: Once | INTRAVENOUS | Status: AC | PRN
  Administered 2012-05-23: 11 via INTRAVENOUS

## 2012-05-23 MED ORDER — REGADENOSON 0.4 MG/5ML IV SOLN
0.4000 mg | Freq: Once | INTRAVENOUS | Status: AC
  Administered 2012-05-23: 0.4 mg via INTRAVENOUS

## 2012-05-23 MED ORDER — TECHNETIUM TC 99M SESTAMIBI GENERIC - CARDIOLITE
32.0000 | Freq: Once | INTRAVENOUS | Status: AC | PRN
  Administered 2012-05-23: 32 via INTRAVENOUS

## 2012-05-23 NOTE — Procedures (Addendum)
[facility] [address] [place] [address] [place] [address] [phone]  Cardiology Nuclear Med Study  Ruben Walker is a 72 y.o. male     MRN : [id number]     DOB: 1940-08-07  Procedure Date: 05/23/2012  Nuclear Med Background Indication for Stress Test:  Evaluation for Ischemia, Abnormal EKG and LBBB, A-Fib History:  Pacer, CHF Cardiac Risk Factors: Hypertension, Lipids and Obesity  Symptoms:  none   Nuclear Pre-Procedure Caffeine/Decaff Intake:  11:00pm NPO After: 9:00am   IV Site: R Antecubital  IV 0.9% NS with Angio Cath:  22g  Chest Size (in):  48 IV Started by: Koren Shiver, CNMT  Height: 5\' 8"  (1.727 m)  Cup Size: n/a  BMI:  Body mass index is 31.17 kg/(m^2). Weight:  205 lb (92.987 kg)   Tech Comments:  n/a    Nuclear Med Study 1 or 2 day study: 1 day  Stress Test Type:  Lexiscan  Order Authorizing Provider:  Susa Griffins, MD   Resting Radionuclide: Technetium 37m Sestamibi  Resting Radionuclide Dose: 11.0 mCi   Stress Radionuclide:  Technetium 54m Sestamibi  Stress Radionuclide Dose: 32.0 mCi           Stress Protocol Rest HR: 72 Stress HR: 71  Rest BP: 139/80 Stress BP: 129/72  Exercise Time (min): n/a METS: n/a   Predicted Max HR: 150 bpm % Max HR: 48 bpm Rate Pressure Product: 41324   Dose of Adenosine (mg):  n/a Dose of Lexiscan: 0.4 mg  Dose of Atropine (mg): n/a Dose of Dobutamine: n/a mcg/kg/min (at max HR)  Stress Test Technologist: Esperanza Sheets, CCT Nuclear Technologist: Gonzella Lex, CNMT   Rest Procedure:  Myocardial perfusion imaging was performed at rest 45 minutes following the intravenous administration of Technetium 29m Sestamibi. Stress Procedure:  The patient received IV Lexiscan 0.4 mg over 15-seconds.  Technetium 98m Sestamibi injected at 30-seconds.  There were no significant changes with Lexiscan.  Quantitative spect images were obtained after a 45 minute delay.  Transient  Ischemic Dilatation (Normal <1.22):  1.02 Lung/Heart Ratio (Normal <0.45): 0.34 QGS EDV:  99 ml QGS ESV:  34 ml LV Ejection Fraction: 65%  Signed by.      Rest ECG: V-paced rhythm  Stress ECG: No significant change from baseline ECG  QPS Raw Data Images:  Normal; no motion artifact; normal heart/lung ratio. Stress Images:  Normal homogeneous uptake in all areas of the myocardium. Rest Images:  Normal homogeneous uptake in all areas of the myocardium. Subtraction (SDS):  No evidence of ischemia.  Impression Exercise Capacity:  Lexiscan with no exercise. BP Response:  Normal blood pressure response. Clinical Symptoms:  No significant symptoms noted. ECG Impression:  No significant ST segment change suggestive of ischemia. Comparison with Prior Nuclear Study: No significant change from previous study  Overall Impression:  Normal stress nuclear study.  LV Wall Motion:  NL LV Function; NL Wall Motion   Runell Gess, MD  05/23/2012 5:17 PM

## 2012-05-23 NOTE — Progress Notes (Signed)
[facility]   2D echo completed 05/23/2012.   Cindy Paizlee Kinder, RDCS   

## 2012-06-06 DIAGNOSIS — Z7901 Long term (current) use of anticoagulants: Secondary | ICD-10-CM | POA: Diagnosis not present

## 2012-06-06 DIAGNOSIS — I4891 Unspecified atrial fibrillation: Secondary | ICD-10-CM | POA: Diagnosis not present

## 2012-07-09 DIAGNOSIS — I4891 Unspecified atrial fibrillation: Secondary | ICD-10-CM | POA: Diagnosis not present

## 2012-07-09 DIAGNOSIS — Z7901 Long term (current) use of anticoagulants: Secondary | ICD-10-CM | POA: Diagnosis not present

## 2012-08-06 DIAGNOSIS — Z7901 Long term (current) use of anticoagulants: Secondary | ICD-10-CM | POA: Diagnosis not present

## 2012-08-06 DIAGNOSIS — I4891 Unspecified atrial fibrillation: Secondary | ICD-10-CM | POA: Diagnosis not present

## 2012-08-22 DIAGNOSIS — I4891 Unspecified atrial fibrillation: Secondary | ICD-10-CM | POA: Diagnosis not present

## 2012-08-22 DIAGNOSIS — R5383 Other fatigue: Secondary | ICD-10-CM | POA: Diagnosis not present

## 2012-08-22 DIAGNOSIS — E782 Mixed hyperlipidemia: Secondary | ICD-10-CM | POA: Diagnosis not present

## 2012-08-22 DIAGNOSIS — R6889 Other general symptoms and signs: Secondary | ICD-10-CM | POA: Diagnosis not present

## 2012-08-22 DIAGNOSIS — Z7901 Long term (current) use of anticoagulants: Secondary | ICD-10-CM | POA: Diagnosis not present

## 2012-08-22 DIAGNOSIS — I872 Venous insufficiency (chronic) (peripheral): Secondary | ICD-10-CM | POA: Diagnosis not present

## 2012-09-06 ENCOUNTER — Encounter: Payer: Self-pay | Admitting: Pharmacist Clinician (PhC)/ Clinical Pharmacy Specialist

## 2012-09-06 DIAGNOSIS — I4821 Permanent atrial fibrillation: Secondary | ICD-10-CM | POA: Insufficient documentation

## 2012-09-06 DIAGNOSIS — I4891 Unspecified atrial fibrillation: Secondary | ICD-10-CM

## 2012-09-06 DIAGNOSIS — Z7901 Long term (current) use of anticoagulants: Secondary | ICD-10-CM | POA: Insufficient documentation

## 2012-09-06 DIAGNOSIS — I482 Chronic atrial fibrillation, unspecified: Secondary | ICD-10-CM

## 2012-09-06 HISTORY — DX: Chronic atrial fibrillation, unspecified: I48.20

## 2012-09-19 ENCOUNTER — Encounter: Payer: Self-pay | Admitting: Cardiovascular Disease

## 2012-09-19 DIAGNOSIS — Z7901 Long term (current) use of anticoagulants: Secondary | ICD-10-CM | POA: Diagnosis not present

## 2012-09-19 DIAGNOSIS — I4891 Unspecified atrial fibrillation: Secondary | ICD-10-CM | POA: Diagnosis not present

## 2012-10-17 ENCOUNTER — Ambulatory Visit (INDEPENDENT_AMBULATORY_CARE_PROVIDER_SITE_OTHER): Payer: Medicare Other | Admitting: Pharmacist Clinician (PhC)/ Clinical Pharmacy Specialist

## 2012-10-17 VITALS — BP 138/82 | HR 80

## 2012-10-17 DIAGNOSIS — Z7901 Long term (current) use of anticoagulants: Secondary | ICD-10-CM

## 2012-10-17 DIAGNOSIS — I4891 Unspecified atrial fibrillation: Secondary | ICD-10-CM | POA: Diagnosis not present

## 2012-11-14 ENCOUNTER — Ambulatory Visit (INDEPENDENT_AMBULATORY_CARE_PROVIDER_SITE_OTHER): Payer: Medicare Other | Admitting: Pharmacist Clinician (PhC)/ Clinical Pharmacy Specialist

## 2012-11-14 VITALS — BP 100/82 | HR 84

## 2012-11-14 DIAGNOSIS — Z7901 Long term (current) use of anticoagulants: Secondary | ICD-10-CM | POA: Diagnosis not present

## 2012-11-14 DIAGNOSIS — I4891 Unspecified atrial fibrillation: Secondary | ICD-10-CM

## 2012-12-05 ENCOUNTER — Ambulatory Visit (INDEPENDENT_AMBULATORY_CARE_PROVIDER_SITE_OTHER): Payer: Medicare Other | Admitting: Pharmacist Clinician (PhC)/ Clinical Pharmacy Specialist

## 2012-12-05 VITALS — BP 118/80 | HR 72

## 2012-12-05 DIAGNOSIS — I4891 Unspecified atrial fibrillation: Secondary | ICD-10-CM

## 2012-12-05 DIAGNOSIS — Z7901 Long term (current) use of anticoagulants: Secondary | ICD-10-CM | POA: Diagnosis not present

## 2013-01-02 ENCOUNTER — Ambulatory Visit (INDEPENDENT_AMBULATORY_CARE_PROVIDER_SITE_OTHER): Payer: Medicare Other | Admitting: Pharmacist Clinician (PhC)/ Clinical Pharmacy Specialist

## 2013-01-02 ENCOUNTER — Other Ambulatory Visit: Payer: Self-pay | Admitting: Cardiovascular Disease

## 2013-01-02 DIAGNOSIS — I472 Ventricular tachycardia: Secondary | ICD-10-CM | POA: Diagnosis not present

## 2013-01-02 DIAGNOSIS — R0602 Shortness of breath: Secondary | ICD-10-CM | POA: Diagnosis not present

## 2013-01-02 DIAGNOSIS — Z7901 Long term (current) use of anticoagulants: Secondary | ICD-10-CM

## 2013-01-02 DIAGNOSIS — I4891 Unspecified atrial fibrillation: Secondary | ICD-10-CM | POA: Diagnosis not present

## 2013-01-02 DIAGNOSIS — E782 Mixed hyperlipidemia: Secondary | ICD-10-CM | POA: Diagnosis not present

## 2013-01-02 DIAGNOSIS — R6889 Other general symptoms and signs: Secondary | ICD-10-CM | POA: Diagnosis not present

## 2013-01-02 DIAGNOSIS — R5381 Other malaise: Secondary | ICD-10-CM | POA: Diagnosis not present

## 2013-01-02 LAB — CBC WITH DIFFERENTIAL/PLATELET
Eosinophils Absolute: 0.2 10*3/uL (ref 0.0–0.7)
Eosinophils Relative: 3 % (ref 0–5)
Hemoglobin: 13.2 g/dL (ref 13.0–17.0)
Lymphocytes Relative: 28 % (ref 12–46)
Lymphs Abs: 2 10*3/uL (ref 0.7–4.0)
MCH: 29.9 pg (ref 26.0–34.0)
MCV: 86.8 fL (ref 78.0–100.0)
Monocytes Relative: 10 % (ref 3–12)
RBC: 4.41 MIL/uL (ref 4.22–5.81)
WBC: 6.9 10*3/uL (ref 4.0–10.5)

## 2013-01-02 LAB — COMPREHENSIVE METABOLIC PANEL
ALT: 14 U/L (ref 0–53)
CO2: 28 mEq/L (ref 19–32)
Calcium: 9.8 mg/dL (ref 8.4–10.5)
Chloride: 99 mEq/L (ref 96–112)
Creat: 1.39 mg/dL — ABNORMAL HIGH (ref 0.50–1.35)
Glucose, Bld: 91 mg/dL (ref 70–99)
Sodium: 135 mEq/L (ref 135–145)
Total Bilirubin: 0.8 mg/dL (ref 0.3–1.2)
Total Protein: 7 g/dL (ref 6.0–8.3)

## 2013-01-02 LAB — LIPID PANEL
Cholesterol: 139 mg/dL (ref 0–200)
Triglycerides: 139 mg/dL (ref <150)
VLDL: 28 mg/dL (ref 0–40)

## 2013-01-02 LAB — PACEMAKER DEVICE OBSERVATION

## 2013-01-09 ENCOUNTER — Encounter: Payer: Self-pay | Admitting: Cardiovascular Disease

## 2013-01-30 ENCOUNTER — Ambulatory Visit (INDEPENDENT_AMBULATORY_CARE_PROVIDER_SITE_OTHER): Payer: Medicare Other | Admitting: Pharmacist Clinician (PhC)/ Clinical Pharmacy Specialist

## 2013-01-30 VITALS — BP 110/64 | HR 80

## 2013-01-30 DIAGNOSIS — I4891 Unspecified atrial fibrillation: Secondary | ICD-10-CM | POA: Diagnosis not present

## 2013-01-30 DIAGNOSIS — Z7901 Long term (current) use of anticoagulants: Secondary | ICD-10-CM | POA: Diagnosis not present

## 2013-01-30 LAB — POCT INR: INR: 3.4

## 2013-02-27 ENCOUNTER — Ambulatory Visit (INDEPENDENT_AMBULATORY_CARE_PROVIDER_SITE_OTHER): Payer: Medicare Other | Admitting: Pharmacist Clinician (PhC)/ Clinical Pharmacy Specialist

## 2013-02-27 DIAGNOSIS — Z7901 Long term (current) use of anticoagulants: Secondary | ICD-10-CM

## 2013-02-27 DIAGNOSIS — I4891 Unspecified atrial fibrillation: Secondary | ICD-10-CM | POA: Diagnosis not present

## 2013-02-27 LAB — POCT INR: INR: 3.1

## 2013-03-02 ENCOUNTER — Other Ambulatory Visit: Payer: Self-pay | Admitting: Cardiovascular Disease

## 2013-03-02 NOTE — Telephone Encounter (Signed)
Rx was sent to pharmacy electronically. 

## 2013-03-20 ENCOUNTER — Telehealth: Payer: Self-pay | Admitting: Internal Medicine

## 2013-03-20 NOTE — Telephone Encounter (Signed)
03-20-13 lmm @ 932am  To set up fu device check with klein, former [facility] patient, offered 03-26-13/mt

## 2013-03-24 ENCOUNTER — Encounter: Payer: Self-pay | Admitting: *Deleted

## 2013-03-26 ENCOUNTER — Encounter: Payer: Self-pay | Admitting: Internal Medicine

## 2013-03-26 ENCOUNTER — Ambulatory Visit (INDEPENDENT_AMBULATORY_CARE_PROVIDER_SITE_OTHER): Payer: Medicare Other | Admitting: Internal Medicine

## 2013-03-26 VITALS — BP 149/78 | HR 73 | Ht 62.0 in | Wt 203.2 lb

## 2013-03-26 DIAGNOSIS — I498 Other specified cardiac arrhythmias: Secondary | ICD-10-CM

## 2013-03-26 DIAGNOSIS — I442 Atrioventricular block, complete: Secondary | ICD-10-CM

## 2013-03-26 DIAGNOSIS — I4891 Unspecified atrial fibrillation: Secondary | ICD-10-CM | POA: Diagnosis not present

## 2013-03-26 DIAGNOSIS — I482 Chronic atrial fibrillation, unspecified: Secondary | ICD-10-CM

## 2013-03-26 DIAGNOSIS — G4730 Sleep apnea, unspecified: Secondary | ICD-10-CM

## 2013-03-26 DIAGNOSIS — G478 Other sleep disorders: Secondary | ICD-10-CM

## 2013-03-26 DIAGNOSIS — G4733 Obstructive sleep apnea (adult) (pediatric): Secondary | ICD-10-CM

## 2013-03-26 DIAGNOSIS — R001 Bradycardia, unspecified: Secondary | ICD-10-CM

## 2013-03-26 DIAGNOSIS — I495 Sick sinus syndrome: Secondary | ICD-10-CM | POA: Diagnosis not present

## 2013-03-26 DIAGNOSIS — I469 Cardiac arrest, cause unspecified: Secondary | ICD-10-CM

## 2013-03-26 DIAGNOSIS — Z95810 Presence of automatic (implantable) cardiac defibrillator: Secondary | ICD-10-CM | POA: Diagnosis not present

## 2013-03-26 HISTORY — DX: Atrioventricular block, complete: I44.2

## 2013-03-26 HISTORY — DX: Obstructive sleep apnea (adult) (pediatric): G47.33

## 2013-03-26 LAB — MDC_IDC_ENUM_SESS_TYPE_INCLINIC
Battery Voltage: 2.93 V
Brady Statistic RV Percent Paced: 99 %
Lead Channel Pacing Threshold Amplitude: 0.4 V
Lead Channel Setting Pacing Amplitude: 2 V
Zone Setting Detection Interval: 250 ms
Zone Setting Detection Interval: 300 ms

## 2013-03-26 NOTE — Assessment & Plan Note (Signed)
He has clinical OSA but refuses sleep testing or masks

## 2013-03-26 NOTE — Assessment & Plan Note (Signed)
The patient has iatrogenic complete heart block. His device dependent.

## 2013-03-26 NOTE — Assessment & Plan Note (Signed)
Permanent. We discussed alternatives to warfarin. He is inclined to continue warfarin as opposed to a NOAC.

## 2013-03-26 NOTE — Assessment & Plan Note (Signed)
No intercurrent ventricular arrhythmias. We discussed the role of home monitoring. He is not inclined currently to embrace that.

## 2013-03-26 NOTE — Patient Instructions (Signed)
Your physician recommends that you continue on your current medications as directed. Please refer to the Current Medication list given to you today.   Your physician wants you to follow-up in: 6 months with device clinic.  You will receive a reminder letter in the mail two months in advance. If you don't receive a letter, please call our office to schedule the follow-up appointment.

## 2013-03-26 NOTE — Progress Notes (Signed)
ELECTROPHYSIOLOGY CONSULT NOTE  Patient ID: Ruben Walker, MRN: [id number], DOB/AGE: 21-Jul-1940 72 y.o. Admit date: (Not on file) Date of Consult: 03/26/2013  Primary Physician: No PCP Per Patient Primary Cardiologist: RAW  Chief Complaint:  To establish   HPI Ruben Walker is a 72 y.o. male  A a seen in followup for an ICD implanted for secondary prevention. He suffered a cardiac arrest 2000. He underwent device generator replacement 2004 and again in 2008. He has permanent atrial fibrillation and underwent multiple cardioversions. At some point he underwent AV junction ablation. He has been on anticoagulation.  He denies chest pain or changes in exercise tolerance He has no edema  NO ICD discharges  He snores. He has significant daytime somnolence. He underwent part 1 is a sleep study but refused part 2. He is not interested in a sleep mask.  He has not wanted home monitoring   Catheterization year 2000 demonstrated nonobstructive coronary disease. Echocardiogram was over recent years have demonstrated ejection fractions the 40s 5-55% range. Most recently he underwent Myoview scanning 1/14 which was normal  and showed significant biatrial enlargement         No past medical history on file.    Surgical History: No past surgical history on file.   Home Meds: Prior to Admission medications   Medication Sig Start Date End Date Taking? Authorizing Provider  cholecalciferol (VITAMIN D) 1000 UNITS tablet Take 1,000 Units by mouth daily.    Historical Provider, MD  magnesium oxide (MAG-OX) 400 MG tablet Take 400 mg by mouth daily.    Historical Provider, MD  metoprolol tartrate (LOPRESSOR) 25 MG tablet Take 25 mg by mouth daily.    Historical Provider, MD  pantoprazole (PROTONIX) 40 MG tablet Take 40 mg by mouth daily.    Historical Provider, MD  potassium chloride (K-DUR,KLOR-CON) 10 MEQ tablet Take 10 mEq by mouth daily.    Historical Provider, MD  potassium  chloride (MICRO-K) 10 MEQ CR capsule Take 1 capsule (10 mEq total) by mouth daily. 03/02/13   Mihai Croitoru, MD  simvastatin (ZOCOR) 20 MG tablet Take 20 mg by mouth at bedtime.    Historical Provider, MD  torsemide (DEMADEX) 20 MG tablet TAKE 1 TABLET DAILY. 03/02/13   Mihai Croitoru, MD  triamcinolone (NASACORT) 55 MCG/ACT nasal inhaler Place 2 sprays into the nose daily as needed.    Historical Provider, MD  valsartan (DIOVAN) 160 MG tablet Take 160 mg by mouth daily.    Historical Provider, MD  warfarin (COUMADIN) 5 MG tablet Take 5 mg by mouth as directed.    Historical Provider, MD    Allergies: No Known Allergies  History   Social History  . Marital Status: Single    Spouse Name: N/A    Number of Children: N/A  . Years of Education: N/A   Occupational History  . Not on file.   Social History Main Topics  . Smoking status: Never Smoker   . Smokeless tobacco: Not on file  . Alcohol Use: Not on file  . Drug Use: Not on file  . Sexual Activity: Not on file   Other Topics Concern  . Not on file   Social History Narrative  . No narrative on file     No family history on file.   ROS:  Please see the history of present illness.     All other systems reviewed and negative.    Physical Exam:   Blood pressure 149/78, pulse 73,  height 5\' 2"  (1.575 m), weight 203 lb 3.2 oz (92.171 kg). General: Well developed, well nourished obese male in no acute distress. Head: Normocephalic, atraumatic, sclera non-icteric, no xanthomas, nares are without discharge. EENT: normal Lymph Nodes:  none Back: without scoliosis/kyphosis, no CVA tendersness Neck: Negative for carotid bruits. JVD not elevated. Lungs: Clear bilaterally to auscultation without wheezes, rales, or rhonchi. Breathing is unlabored. Heart: RRR with S1 S2. No murmur , rubs, or gallops appreciated. Abdomen: Soft, non-tender, non-distended with normoactive bowel sounds. No hepatomegaly. No rebound/guarding. No obvious  abdominal masses. Msk:  Strength and tone appear normal for age. Extremities: No clubbing or cyanosis.  Tr  edema.  Distal pedal pulses are 2+ and equal bilaterally. Skin: Warm and Dry Neuro: Alert and oriented X 3. CN III-XII intact Grossly normal sensory and motor function . Psych:  Responds to questions appropriately with a normal affect.      Labs: Cardiac Enzymes No results found for this basename: CKTOTAL, CKMB, TROPONINI,  in the last 72 hours CBC Lab Results  Component Value Date   WBC 6.9 01/02/2013   HGB 13.2 01/02/2013   HCT 38.3* 01/02/2013   MCV 86.8 01/02/2013   PLT 210 01/02/2013   PROTIME: No results found for this basename: LABPROT, INR,  in the last 72 hours Chemistry No results found for this basename: NA, K, CL, CO2, BUN, CREATININE, CALCIUM, LABALBU, PROT, BILITOT, ALKPHOS, ALT, AST, GLUCOSE,  in the last 168 hours Lipids Lab Results  Component Value Date   CHOL 139 01/02/2013   HDL 42 01/02/2013   LDLCALC 69 01/02/2013   TRIG 139 01/02/2013   BNP No results found for this basename: probnp   Miscellaneous No results found for this basename: DDIMER    Radiology/Studies:  No results found.  EKG: Atrial fibrillation with underlying ventricular pacing   Assessment and Plan:   Sherryl Manges

## 2013-03-30 ENCOUNTER — Ambulatory Visit (INDEPENDENT_AMBULATORY_CARE_PROVIDER_SITE_OTHER): Payer: Medicare Other | Admitting: Pharmacist Clinician (PhC)/ Clinical Pharmacy Specialist

## 2013-03-30 VITALS — BP 122/64 | HR 60

## 2013-03-30 DIAGNOSIS — Z7901 Long term (current) use of anticoagulants: Secondary | ICD-10-CM | POA: Diagnosis not present

## 2013-03-30 DIAGNOSIS — I4891 Unspecified atrial fibrillation: Secondary | ICD-10-CM

## 2013-03-30 LAB — POCT INR: INR: 3.1

## 2013-04-27 ENCOUNTER — Ambulatory Visit (INDEPENDENT_AMBULATORY_CARE_PROVIDER_SITE_OTHER): Payer: Medicare Other | Admitting: Pharmacist Clinician (PhC)/ Clinical Pharmacy Specialist

## 2013-04-27 VITALS — BP 126/80 | HR 76

## 2013-04-27 DIAGNOSIS — Z7901 Long term (current) use of anticoagulants: Secondary | ICD-10-CM

## 2013-04-27 DIAGNOSIS — I4891 Unspecified atrial fibrillation: Secondary | ICD-10-CM | POA: Diagnosis not present

## 2013-04-27 LAB — POCT INR: INR: 2.1

## 2013-05-25 ENCOUNTER — Ambulatory Visit (INDEPENDENT_AMBULATORY_CARE_PROVIDER_SITE_OTHER): Payer: Medicare Other | Admitting: Pharmacist Clinician (PhC)/ Clinical Pharmacy Specialist

## 2013-05-25 VITALS — BP 142/60 | HR 76

## 2013-05-25 DIAGNOSIS — Z7901 Long term (current) use of anticoagulants: Secondary | ICD-10-CM | POA: Diagnosis not present

## 2013-05-25 DIAGNOSIS — I4891 Unspecified atrial fibrillation: Secondary | ICD-10-CM | POA: Diagnosis not present

## 2013-05-25 LAB — POCT INR: INR: 2.5

## 2013-05-25 MED ORDER — SIMVASTATIN 20 MG PO TABS: 20.0000 mg | ORAL_TABLET | Freq: Every day | ORAL | Status: DC

## 2013-05-25 MED ORDER — VALSARTAN 160 MG PO TABS: 160.0000 mg | ORAL_TABLET | Freq: Every day | ORAL | Status: DC

## 2013-05-25 MED ORDER — WARFARIN SODIUM 5 MG PO TABS: 5.0000 mg | ORAL_TABLET | ORAL | Status: DC

## 2013-05-25 MED ORDER — PANTOPRAZOLE SODIUM 40 MG PO TBEC: 40.0000 mg | DELAYED_RELEASE_TABLET | Freq: Every day | ORAL | Status: DC

## 2013-05-25 MED ORDER — METOPROLOL TARTRATE 25 MG PO TABS: 25.0000 mg | ORAL_TABLET | Freq: Every day | ORAL | Status: DC

## 2013-05-25 MED ORDER — POTASSIUM CHLORIDE ER 10 MEQ PO CPCR: 10.0000 meq | ORAL_CAPSULE | Freq: Every day | ORAL | Status: DC

## 2013-06-22 ENCOUNTER — Ambulatory Visit (INDEPENDENT_AMBULATORY_CARE_PROVIDER_SITE_OTHER): Payer: Medicare Other | Admitting: Pharmacist Clinician (PhC)/ Clinical Pharmacy Specialist

## 2013-06-22 VITALS — BP 138/70 | HR 72

## 2013-06-22 DIAGNOSIS — I4891 Unspecified atrial fibrillation: Secondary | ICD-10-CM

## 2013-06-22 DIAGNOSIS — Z7901 Long term (current) use of anticoagulants: Secondary | ICD-10-CM | POA: Diagnosis not present

## 2013-06-22 LAB — POCT INR: INR: 2.3

## 2013-07-20 ENCOUNTER — Other Ambulatory Visit: Payer: Self-pay | Admitting: *Deleted

## 2013-07-20 ENCOUNTER — Telehealth: Payer: Self-pay | Admitting: Internal Medicine

## 2013-07-20 ENCOUNTER — Ambulatory Visit (INDEPENDENT_AMBULATORY_CARE_PROVIDER_SITE_OTHER): Payer: Medicare Other | Admitting: Pharmacist Clinician (PhC)/ Clinical Pharmacy Specialist

## 2013-07-20 VITALS — BP 122/60 | HR 72

## 2013-07-20 DIAGNOSIS — I4891 Unspecified atrial fibrillation: Secondary | ICD-10-CM

## 2013-07-20 DIAGNOSIS — Z7901 Long term (current) use of anticoagulants: Secondary | ICD-10-CM

## 2013-07-20 LAB — POCT INR: INR: 3.1

## 2013-07-20 NOTE — Telephone Encounter (Signed)
Refill refused - defer to S. Graciela Husbands, MD

## 2013-07-20 NOTE — Telephone Encounter (Signed)
Pt calling concerned that he has no cardiologist following him anymore, former [place] pt. He is wanting to know who will be following him from now on. I will discuss this with Dr. Graciela Husbands and get back with pt. He was thankful and agreeable to plan.

## 2013-07-20 NOTE — Telephone Encounter (Signed)
New Message  Pt called.  Requesting a call back to discuss medications that he is currently taking. He wants to be sure that there are no changes that need to be made. Please call.

## 2013-07-28 NOTE — Telephone Encounter (Addendum)
Advised pt that Dr. Graciela Husbands would be following him for his rhythm issues.  Pt thankful and agreeable to plan.

## 2013-08-05 DIAGNOSIS — R609 Edema, unspecified: Secondary | ICD-10-CM | POA: Diagnosis not present

## 2013-08-05 DIAGNOSIS — M7989 Other specified soft tissue disorders: Secondary | ICD-10-CM | POA: Diagnosis not present

## 2013-08-05 DIAGNOSIS — I10 Essential (primary) hypertension: Secondary | ICD-10-CM | POA: Diagnosis not present

## 2013-08-05 DIAGNOSIS — M109 Gout, unspecified: Secondary | ICD-10-CM | POA: Diagnosis not present

## 2013-08-05 DIAGNOSIS — I509 Heart failure, unspecified: Secondary | ICD-10-CM | POA: Diagnosis not present

## 2013-08-05 DIAGNOSIS — E785 Hyperlipidemia, unspecified: Secondary | ICD-10-CM | POA: Diagnosis not present

## 2013-08-05 DIAGNOSIS — Z7901 Long term (current) use of anticoagulants: Secondary | ICD-10-CM | POA: Diagnosis not present

## 2013-08-05 DIAGNOSIS — Z79899 Other long term (current) drug therapy: Secondary | ICD-10-CM | POA: Diagnosis not present

## 2013-08-10 ENCOUNTER — Encounter: Payer: Self-pay | Admitting: Internal Medicine

## 2013-08-10 ENCOUNTER — Ambulatory Visit (INDEPENDENT_AMBULATORY_CARE_PROVIDER_SITE_OTHER): Payer: Medicare Other | Admitting: Internal Medicine

## 2013-08-10 ENCOUNTER — Encounter: Payer: Self-pay | Admitting: *Deleted

## 2013-08-10 ENCOUNTER — Telehealth: Payer: Self-pay | Admitting: *Deleted

## 2013-08-10 VITALS — BP 137/72 | HR 74 | Ht 61.0 in | Wt 209.0 lb

## 2013-08-10 DIAGNOSIS — M7989 Other specified soft tissue disorders: Secondary | ICD-10-CM

## 2013-08-10 DIAGNOSIS — M79609 Pain in unspecified limb: Secondary | ICD-10-CM

## 2013-08-10 DIAGNOSIS — I469 Cardiac arrest, cause unspecified: Secondary | ICD-10-CM | POA: Diagnosis not present

## 2013-08-10 DIAGNOSIS — M79669 Pain in unspecified lower leg: Secondary | ICD-10-CM

## 2013-08-10 LAB — MDC_IDC_ENUM_SESS_TYPE_INCLINIC
HIGH POWER IMPEDANCE MEASURED VALUE: 40 Ohm
HIGH POWER IMPEDANCE MEASURED VALUE: 42 Ohm
Implantable Pulse Generator Serial Number: 503349
Lead Channel Impedance Value: 1116 Ohm
Lead Channel Impedance Value: 3000 Ohm
Lead Channel Pacing Threshold Amplitude: 0.4 V
Lead Channel Pacing Threshold Pulse Width: 0.4 ms
Lead Channel Setting Pacing Amplitude: 2.4 V
Lead Channel Setting Pacing Pulse Width: 0.4 ms
MDC IDC MSMT BATTERY VOLTAGE: 2.88 V
MDC IDC MSMT LEADCHNL RV SENSING INTR AMPL: 14 mV
MDC IDC SESS DTM: 20150323040000
MDC IDC SET ZONE DETECTION INTERVAL: 300 ms
MDC IDC SET ZONE DETECTION INTERVAL: 375 ms
MDC IDC STAT BRADY RA PERCENT PACED: 0 %
MDC IDC STAT BRADY RV PERCENT PACED: 99 %
Zone Setting Detection Interval: 250 ms

## 2013-08-10 MED ORDER — ALLOPURINOL 300 MG PO TABS: 300.0000 mg | ORAL_TABLET | Freq: Every day | ORAL | Status: DC

## 2013-08-10 MED ORDER — CEPHALEXIN 500 MG PO CAPS: 500.0000 mg | ORAL_CAPSULE | Freq: Three times a day (TID) | ORAL | Status: DC

## 2013-08-10 MED ORDER — INDOMETHACIN 25 MG PO CAPS: 25.0000 mg | ORAL_CAPSULE | Freq: Every day | ORAL | Status: DC

## 2013-08-10 NOTE — Patient Instructions (Addendum)
Your physician has recommended you make the following change in your medication:  1) Increase Torsemide to 20 mg twice daily for 7 days, then return to normal dosing 2) Start Indocin 25 mg daily for 7 days  3) Start Keflex 500 mg every 8 hours for 7 days 4) Allopurinol 300 mg daily  Your physician recommends that you return for lab work in:  one week for BMET  Your physician recommends that you schedule a follow-up appointment in: 3 months with device clinic.  Your physician recommends that you schedule a follow-up appointment in: 2-3 weeks with Tereso Newcomer, PA  Your physician wants you to follow-up in: 1 year with Dr. Graciela Husbands.  You will receive a reminder letter in the mail two months in advance. If you don't receive a letter, please call our office to schedule the follow-up appointment.

## 2013-08-10 NOTE — Telephone Encounter (Signed)
called for records on [facility],  pt stated he was in the [place] ED on the 18th of march 2015. Called and talked to [place]. FOund records having them faxed over.
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
## 2013-08-17 ENCOUNTER — Other Ambulatory Visit (INDEPENDENT_AMBULATORY_CARE_PROVIDER_SITE_OTHER): Payer: Medicare Other

## 2013-08-17 ENCOUNTER — Other Ambulatory Visit: Payer: Self-pay | Admitting: *Deleted

## 2013-08-17 DIAGNOSIS — M7989 Other specified soft tissue disorders: Secondary | ICD-10-CM | POA: Diagnosis not present

## 2013-08-17 DIAGNOSIS — M79609 Pain in unspecified limb: Secondary | ICD-10-CM | POA: Diagnosis not present

## 2013-08-17 DIAGNOSIS — M79669 Pain in unspecified lower leg: Secondary | ICD-10-CM

## 2013-08-17 DIAGNOSIS — I890 Lymphedema, not elsewhere classified: Secondary | ICD-10-CM

## 2013-08-17 LAB — BASIC METABOLIC PANEL
BUN: 29 mg/dL — ABNORMAL HIGH (ref 6–23)
CO2: 29 mEq/L (ref 19–32)
CREATININE: 1.7 mg/dL — AB (ref 0.4–1.5)
Calcium: 10.6 mg/dL — ABNORMAL HIGH (ref 8.4–10.5)
Chloride: 97 mEq/L (ref 96–112)
GFR: 42.4 mL/min — ABNORMAL LOW (ref >60.00)
Glucose, Bld: 90 mg/dL (ref 70–99)
Potassium: 4.2 mEq/L (ref 3.5–5.1)
SODIUM: 135 meq/L (ref 135–145)

## 2013-08-18 ENCOUNTER — Other Ambulatory Visit: Payer: Self-pay | Admitting: *Deleted

## 2013-08-18 ENCOUNTER — Telehealth: Payer: Self-pay | Admitting: *Deleted

## 2013-08-18 MED ORDER — TORSEMIDE 20 MG PO TABS: 20.0000 mg | ORAL_TABLET | Freq: Every day | ORAL | Status: DC

## 2013-08-18 NOTE — Telephone Encounter (Signed)
A user error has taken place: encounter opened in error, closed for administrative reasons.

## 2013-08-18 NOTE — Telephone Encounter (Signed)
Rx refill sent to patients pharmacy  

## 2013-08-26 ENCOUNTER — Telehealth: Payer: Self-pay | Admitting: *Deleted

## 2013-08-26 ENCOUNTER — Other Ambulatory Visit: Payer: Medicare Other

## 2013-08-26 ENCOUNTER — Encounter: Payer: Self-pay | Admitting: Physician Assistant

## 2013-08-26 ENCOUNTER — Ambulatory Visit (INDEPENDENT_AMBULATORY_CARE_PROVIDER_SITE_OTHER): Payer: Medicare Other | Admitting: Physician Assistant

## 2013-08-26 VITALS — BP 116/66 | HR 70 | Ht 61.0 in | Wt 207.0 lb

## 2013-08-26 DIAGNOSIS — I4891 Unspecified atrial fibrillation: Secondary | ICD-10-CM | POA: Diagnosis not present

## 2013-08-26 DIAGNOSIS — I2510 Atherosclerotic heart disease of native coronary artery without angina pectoris: Secondary | ICD-10-CM

## 2013-08-26 DIAGNOSIS — I5022 Chronic systolic (congestive) heart failure: Secondary | ICD-10-CM | POA: Diagnosis not present

## 2013-08-26 DIAGNOSIS — R609 Edema, unspecified: Secondary | ICD-10-CM | POA: Diagnosis not present

## 2013-08-26 DIAGNOSIS — I429 Cardiomyopathy, unspecified: Secondary | ICD-10-CM

## 2013-08-26 DIAGNOSIS — I428 Other cardiomyopathies: Secondary | ICD-10-CM

## 2013-08-26 DIAGNOSIS — R899 Unspecified abnormal finding in specimens from other organs, systems and tissues: Secondary | ICD-10-CM

## 2013-08-26 DIAGNOSIS — N179 Acute kidney failure, unspecified: Secondary | ICD-10-CM

## 2013-08-26 LAB — BASIC METABOLIC PANEL
BUN: 19 mg/dL (ref 6–23)
CHLORIDE: 96 meq/L (ref 96–112)
CO2: 27 mEq/L (ref 19–32)
Calcium: 9.8 mg/dL (ref 8.4–10.5)
Creatinine, Ser: 1.4 mg/dL (ref 0.4–1.5)
GFR: 54.85 mL/min — AB (ref >60.00)
GLUCOSE: 84 mg/dL (ref 70–99)
POTASSIUM: 3.8 meq/L (ref 3.5–5.1)
SODIUM: 133 meq/L — AB (ref 135–145)

## 2013-08-26 NOTE — Telephone Encounter (Signed)
Patient lives 30 miles away. He is seeing his PCP in [place] today. He will have labs done there and forward to [place].

## 2013-08-26 NOTE — Patient Instructions (Signed)
NO CHANGES WERE MADE TODAY WITH MEDICATIONS  LAB WORK TODAY; BMET  FOLLOW UP AS PLANNED

## 2013-08-26 NOTE — Telephone Encounter (Signed)
Message copied by Fransico Setters on Wed Aug 26, 2013  8:25 AM ------      Message from: Duke Salvia      Created: Mon Aug 24, 2013  8:36 PM       Please Inform Ruben Walker that previous abnormality in renal function is worse;  Please recheck on Friday 4/10            Thanks       ------
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
## 2013-08-28 ENCOUNTER — Other Ambulatory Visit: Payer: Self-pay | Admitting: Cardiovascular Disease

## 2013-08-31 ENCOUNTER — Ambulatory Visit (INDEPENDENT_AMBULATORY_CARE_PROVIDER_SITE_OTHER): Payer: Medicare Other | Admitting: Pharmacist Clinician (PhC)/ Clinical Pharmacy Specialist

## 2013-08-31 DIAGNOSIS — I4891 Unspecified atrial fibrillation: Secondary | ICD-10-CM

## 2013-08-31 DIAGNOSIS — Z7901 Long term (current) use of anticoagulants: Secondary | ICD-10-CM

## 2013-08-31 LAB — POCT INR: INR: 2.7

## 2013-08-31 MED ORDER — PANTOPRAZOLE SODIUM 40 MG PO TBEC: 40.0000 mg | DELAYED_RELEASE_TABLET | Freq: Two times a day (BID) | ORAL | Status: DC

## 2013-08-31 MED ORDER — METOPROLOL TARTRATE 25 MG PO TABS: 12.5000 mg | ORAL_TABLET | Freq: Two times a day (BID) | ORAL | Status: DC

## 2013-08-31 MED ORDER — METOPROLOL TARTRATE 25 MG PO TABS: 25.0000 mg | ORAL_TABLET | Freq: Two times a day (BID) | ORAL | Status: DC

## 2013-08-31 NOTE — Progress Notes (Signed)
Adjusted  pantoprazole back to twice daily, pt has been taking metoprolol tartrate 25mg  qd - per paper chart was originally on metoprolol succ 25mg  qd, changed, rx today to 12.5mg  bid.  Rx sent to [facility]

## 2013-09-02 ENCOUNTER — Telehealth: Payer: Self-pay | Admitting: *Deleted

## 2013-09-02 NOTE — Telephone Encounter (Signed)
Dr. Graciela Husbands ordered BMP on patient  Labs were done by PCP on 08/26/13

## 2013-09-15 ENCOUNTER — Telehealth: Payer: Self-pay | Admitting: Cardiology

## 2013-09-15 NOTE — Telephone Encounter (Signed)
PA for pantoprazole faxed to [facility]

## 2013-09-15 NOTE — Telephone Encounter (Signed)
Please call concerning his Pantoprazole. She needs his diagnosis,how long he have been on it and have he tried anything else?

## 2013-09-15 NOTE — Telephone Encounter (Signed)
Pt followed by Dr. Graciela Husbands since Dr. Alanda Amass retired.  Message forwarded to [employer].

## 2013-09-23 ENCOUNTER — Ambulatory Visit (INDEPENDENT_AMBULATORY_CARE_PROVIDER_SITE_OTHER): Payer: Medicare Other | Admitting: *Deleted

## 2013-09-23 DIAGNOSIS — I428 Other cardiomyopathies: Secondary | ICD-10-CM | POA: Diagnosis not present

## 2013-09-23 DIAGNOSIS — I442 Atrioventricular block, complete: Secondary | ICD-10-CM

## 2013-09-23 LAB — MDC_IDC_ENUM_SESS_TYPE_INCLINIC
Battery Voltage: 2.91 V
Brady Statistic RA Percent Paced: 0 %
Brady Statistic RV Percent Paced: 99 %
Date Time Interrogation Session: 20150506040000
HIGH POWER IMPEDANCE MEASURED VALUE: 39 Ohm
HighPow Impedance: 40 Ohm
Implantable Pulse Generator Serial Number: 503349
Lead Channel Impedance Value: 1050 Ohm
Lead Channel Impedance Value: 3000 Ohm
Lead Channel Pacing Threshold Amplitude: 0.4 V
Lead Channel Pacing Threshold Pulse Width: 0.5 ms
Lead Channel Sensing Intrinsic Amplitude: 14.3 mV
Lead Channel Setting Pacing Amplitude: 2.4 V
Lead Channel Setting Pacing Pulse Width: 0.4 ms
MDC IDC MSMT LEADCHNL RV PACING THRESHOLD AMPLITUDE: 0.4 V
MDC IDC MSMT LEADCHNL RV PACING THRESHOLD PULSEWIDTH: 0.4 ms
MDC IDC SET ZONE DETECTION INTERVAL: 250 ms
MDC IDC SET ZONE DETECTION INTERVAL: 300 ms
MDC IDC SET ZONE DETECTION INTERVAL: 375 ms

## 2013-09-23 NOTE — Progress Notes (Signed)
ICD check in clinic. Normal device function. Thresholds and sensing consistent with previous device measurements. Impedance trends stable over time. No evidence of any ventricular arrhythmias. Histogram distribution blunted and patient c/o SOB and fatigue on exertion.  Rate response reprogrammed med/low. Device programmed at appropriate safety margins. Device programmed to optimize intrinsic conduction.  Patient education completed including shock plan. Alert tones/vibration demonstrated for patient.  ROV 3 months with the device clinic.

## 2013-10-14 ENCOUNTER — Ambulatory Visit (INDEPENDENT_AMBULATORY_CARE_PROVIDER_SITE_OTHER): Payer: Medicare Other | Admitting: Pharmacist Clinician (PhC)/ Clinical Pharmacy Specialist

## 2013-10-14 DIAGNOSIS — Z7901 Long term (current) use of anticoagulants: Secondary | ICD-10-CM | POA: Diagnosis not present

## 2013-10-14 DIAGNOSIS — I4891 Unspecified atrial fibrillation: Secondary | ICD-10-CM

## 2013-10-14 LAB — POCT INR: INR: 2.2

## 2013-10-20 ENCOUNTER — Telehealth: Payer: Self-pay | Admitting: *Deleted

## 2013-10-20 NOTE — Telephone Encounter (Signed)
[employer] approved pantoprazole through 09/16/2014

## 2013-10-29 ENCOUNTER — Other Ambulatory Visit: Payer: Self-pay | Admitting: Cardiovascular Disease

## 2013-10-29 ENCOUNTER — Telehealth: Payer: Self-pay | Admitting: Pharmacist Clinician (PhC)/ Clinical Pharmacy Specialist

## 2013-10-30 ENCOUNTER — Other Ambulatory Visit: Payer: Self-pay | Admitting: *Deleted

## 2013-10-30 NOTE — Telephone Encounter (Signed)
Closed encounter °

## 2013-11-02 ENCOUNTER — Telehealth: Payer: Self-pay | Admitting: Internal Medicine

## 2013-11-02 DIAGNOSIS — J40 Bronchitis, not specified as acute or chronic: Secondary | ICD-10-CM | POA: Diagnosis not present

## 2013-11-02 DIAGNOSIS — R059 Cough, unspecified: Secondary | ICD-10-CM | POA: Diagnosis not present

## 2013-11-02 DIAGNOSIS — M109 Gout, unspecified: Secondary | ICD-10-CM | POA: Diagnosis not present

## 2013-11-02 DIAGNOSIS — I10 Essential (primary) hypertension: Secondary | ICD-10-CM | POA: Diagnosis not present

## 2013-11-02 DIAGNOSIS — Z7901 Long term (current) use of anticoagulants: Secondary | ICD-10-CM | POA: Diagnosis not present

## 2013-11-02 DIAGNOSIS — I509 Heart failure, unspecified: Secondary | ICD-10-CM | POA: Diagnosis not present

## 2013-11-02 DIAGNOSIS — E785 Hyperlipidemia, unspecified: Secondary | ICD-10-CM | POA: Diagnosis not present

## 2013-11-02 DIAGNOSIS — Z79899 Other long term (current) drug therapy: Secondary | ICD-10-CM | POA: Diagnosis not present

## 2013-11-02 DIAGNOSIS — I517 Cardiomegaly: Secondary | ICD-10-CM | POA: Diagnosis not present

## 2013-11-02 DIAGNOSIS — Z95810 Presence of automatic (implantable) cardiac defibrillator: Secondary | ICD-10-CM | POA: Diagnosis not present

## 2013-11-02 NOTE — Telephone Encounter (Signed)
Calling stating he has been SOB, chest congested and productive cough.  States he has been SOB with chest congestion x 1 week. His cough is productive with yellow sputum. No temp.  Slight swelling in (L) foot.  Doesn't know if has had wt gain because he doesn't weigh.   Spoke w/Dr. Graciela Husbands who advises for him to go to urgent care to be evaluated. Advised pt and he said he would probably go to the [facility] which is close to his house.

## 2013-11-02 NOTE — Telephone Encounter (Signed)
New problem   Pt having sob and congestion in his chest.

## 2013-11-02 NOTE — Telephone Encounter (Signed)
Patient no longer under prescriber care. Per Tereso Newcomer, PA-C's last office note with the patient, his primary cardiologist is now Dr Sherryl Manges.

## 2013-11-06 ENCOUNTER — Encounter: Payer: Self-pay | Admitting: Internal Medicine

## 2013-11-23 ENCOUNTER — Ambulatory Visit (INDEPENDENT_AMBULATORY_CARE_PROVIDER_SITE_OTHER): Payer: Medicare Other | Admitting: Pharmacist

## 2013-11-23 DIAGNOSIS — Z7901 Long term (current) use of anticoagulants: Secondary | ICD-10-CM

## 2013-11-23 DIAGNOSIS — I4891 Unspecified atrial fibrillation: Secondary | ICD-10-CM | POA: Diagnosis not present

## 2013-11-23 LAB — POCT INR: INR: 2.5

## 2013-12-01 ENCOUNTER — Other Ambulatory Visit: Payer: Self-pay

## 2013-12-01 MED ORDER — VALSARTAN 160 MG PO TABS: 160.0000 mg | ORAL_TABLET | Freq: Every day | ORAL | Status: DC

## 2013-12-03 ENCOUNTER — Encounter: Payer: Self-pay | Admitting: Family Medicine

## 2013-12-03 ENCOUNTER — Ambulatory Visit (INDEPENDENT_AMBULATORY_CARE_PROVIDER_SITE_OTHER): Payer: Medicare Other | Admitting: Family Medicine

## 2013-12-03 VITALS — BP 135/77 | HR 87 | Temp 97.9°F | Resp 18 | Ht 65.0 in | Wt 196.0 lb

## 2013-12-03 DIAGNOSIS — I4891 Unspecified atrial fibrillation: Secondary | ICD-10-CM

## 2013-12-03 DIAGNOSIS — I10 Essential (primary) hypertension: Secondary | ICD-10-CM

## 2013-12-03 DIAGNOSIS — I2510 Atherosclerotic heart disease of native coronary artery without angina pectoris: Secondary | ICD-10-CM | POA: Diagnosis not present

## 2013-12-03 MED ORDER — LEVOCETIRIZINE DIHYDROCHLORIDE 5 MG PO TABS: 5.0000 mg | ORAL_TABLET | Freq: Every evening | ORAL | Status: DC

## 2013-12-03 MED ORDER — FLUTICASONE PROPIONATE 50 MCG/ACT NA SUSP: 2.0000 | Freq: Every day | NASAL | Status: DC

## 2013-12-03 NOTE — Assessment & Plan Note (Signed)
Problem stable.  Continue current medications and diet appropriate for this condition.  We have reviewed our general long term plan for this problem and also reviewed symptoms and signs that should prompt the patient to call or return to the office.  

## 2013-12-03 NOTE — Assessment & Plan Note (Signed)
With hx of complete heart block. He has pacer + rate control with lopressor 12.5 mg bid + anticoag with coumadin as per Dr. Odessa Fleming.

## 2013-12-03 NOTE — Progress Notes (Signed)
Pre visit review using our clinic review tool, if applicable. No additional management support is needed unless otherwise documented below in the visit note. 
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
## 2013-12-11 ENCOUNTER — Other Ambulatory Visit: Payer: Self-pay | Admitting: *Deleted

## 2013-12-11 MED ORDER — POTASSIUM CHLORIDE ER 10 MEQ PO CPCR: 10.0000 meq | ORAL_CAPSULE | Freq: Every day | ORAL | Status: DC

## 2013-12-11 MED ORDER — SIMVASTATIN 20 MG PO TABS: 20.0000 mg | ORAL_TABLET | Freq: Every day | ORAL | Status: DC

## 2013-12-11 NOTE — Telephone Encounter (Signed)
erx for potassium and simvastatin

## 2013-12-14 ENCOUNTER — Other Ambulatory Visit: Payer: Self-pay | Admitting: Pharmacist Clinician (PhC)/ Clinical Pharmacy Specialist

## 2013-12-14 MED ORDER — WARFARIN SODIUM 5 MG PO TABS: 5.0000 mg | ORAL_TABLET | ORAL | Status: DC

## 2013-12-24 ENCOUNTER — Ambulatory Visit (INDEPENDENT_AMBULATORY_CARE_PROVIDER_SITE_OTHER): Payer: Medicare Other | Admitting: *Deleted

## 2013-12-24 ENCOUNTER — Ambulatory Visit (INDEPENDENT_AMBULATORY_CARE_PROVIDER_SITE_OTHER): Payer: Medicare Other

## 2013-12-24 DIAGNOSIS — I482 Chronic atrial fibrillation, unspecified: Secondary | ICD-10-CM

## 2013-12-24 DIAGNOSIS — Z7901 Long term (current) use of anticoagulants: Secondary | ICD-10-CM | POA: Diagnosis not present

## 2013-12-24 DIAGNOSIS — I442 Atrioventricular block, complete: Secondary | ICD-10-CM

## 2013-12-24 DIAGNOSIS — I4891 Unspecified atrial fibrillation: Secondary | ICD-10-CM

## 2013-12-24 LAB — MDC_IDC_ENUM_SESS_TYPE_INCLINIC
Battery Voltage: 2.9 V
Brady Statistic RA Percent Paced: 0 %
HighPow Impedance: 39 Ohm
HighPow Impedance: 40 Ohm
Lead Channel Impedance Value: 1009 Ohm
Lead Channel Pacing Threshold Amplitude: 0.4 V
Lead Channel Pacing Threshold Amplitude: 0.4 V
Lead Channel Pacing Threshold Pulse Width: 0.4 ms
Lead Channel Pacing Threshold Pulse Width: 0.4 ms
Lead Channel Pacing Threshold Pulse Width: 0.4 ms
Lead Channel Pacing Threshold Pulse Width: 0.5 ms
Lead Channel Sensing Intrinsic Amplitude: 10.3 mV
Lead Channel Setting Pacing Amplitude: 2.4 V
Lead Channel Setting Pacing Pulse Width: 0.4 ms
MDC IDC MSMT LEADCHNL RA IMPEDANCE VALUE: 3000 Ohm — AB
MDC IDC MSMT LEADCHNL RV PACING THRESHOLD AMPLITUDE: 0.2 V — AB
MDC IDC MSMT LEADCHNL RV PACING THRESHOLD AMPLITUDE: 0.4 V
MDC IDC MSMT LEADCHNL RV PACING THRESHOLD AMPLITUDE: 0.4 V
MDC IDC MSMT LEADCHNL RV PACING THRESHOLD PULSEWIDTH: 0.4 ms
MDC IDC PG SERIAL: 503349
MDC IDC SESS DTM: 20150806040000
MDC IDC SET ZONE DETECTION INTERVAL: 300 ms
MDC IDC SET ZONE DETECTION INTERVAL: 375 ms
MDC IDC STAT BRADY RV PERCENT PACED: 99 %
Zone Setting Detection Interval: 250 ms

## 2013-12-24 LAB — POCT INR: INR: 4

## 2013-12-24 NOTE — Progress Notes (Signed)
ICD check in clinic. Normal device function. Thresholds and sensing consistent with previous device measurements. Impedance trends stable over time. No evidence of any ventricular arrhythmias. Histogram distribution appropriate for patient and level of activity. No changes made this session. Device programmed at appropriate safety margins. Device programmed to optimize intrinsic conduction. Battery @2 .90V/BOL. ROV w/ device clinic 04/05/14 & ROV w/ Dr. Graciela Husbands 3/16.

## 2014-01-04 ENCOUNTER — Encounter: Payer: Self-pay | Admitting: Family Medicine

## 2014-01-04 ENCOUNTER — Ambulatory Visit (INDEPENDENT_AMBULATORY_CARE_PROVIDER_SITE_OTHER): Payer: Medicare Other | Admitting: Family Medicine

## 2014-01-04 VITALS — BP 134/80 | HR 71 | Temp 98.6°F | Resp 18 | Ht 65.0 in | Wt 192.0 lb

## 2014-01-04 DIAGNOSIS — J441 Chronic obstructive pulmonary disease with (acute) exacerbation: Secondary | ICD-10-CM | POA: Diagnosis not present

## 2014-01-04 DIAGNOSIS — I2510 Atherosclerotic heart disease of native coronary artery without angina pectoris: Secondary | ICD-10-CM | POA: Diagnosis not present

## 2014-01-04 MED ORDER — PREDNISONE 20 MG PO TABS: ORAL_TABLET | ORAL | Status: DC

## 2014-01-04 MED ORDER — BUDESONIDE-FORMOTEROL FUMARATE 80-4.5 MCG/ACT IN AERO: 2.0000 | INHALATION_SPRAY | Freq: Two times a day (BID) | RESPIRATORY_TRACT | Status: DC

## 2014-01-04 NOTE — Progress Notes (Signed)
Pre visit review using our clinic review tool, if applicable. No additional management support is needed unless otherwise documented below in the visit note. 

## 2014-01-04 NOTE — Progress Notes (Signed)
OFFICE NOTE  01/04/2014  CC:  Chief Complaint  Patient presents with  . Follow-up  . Cough    early mornings and late in the evening.     HPI: Patient is a 73 y.o. Caucasian male who is here for 1 mo f/u cough. Last visit I restarted his flonase and xyzal for PND cough--chronic.  He had recently recovered from a case of bronchitis. Says cough still mainly in mornings and evenings, sometimes productive of yellowish phlegm.  Occ "fits" of coughing that cause some back pain.  No fevers.    SInce last visit I reviewed his [facility] ED records: CXR 11/02/13 showed no acute dz.  Doxycycline rx'd for acute bronchitis at that visit.  Says recent pacer check was good.  Pertinent PMH:  Past medical, surgical, social, and family history reviewed and no changes are noted since last office visit.  MEDS:  Outpatient Prescriptions Prior to Visit  Medication Sig Dispense Refill  . allopurinol (ZYLOPRIM) 300 MG tablet Take 100 mg by mouth daily.      . cholecalciferol (VITAMIN D) 1000 UNITS tablet Take 1,000 Units by mouth daily.      . fluticasone (FLONASE) 50 MCG/ACT nasal spray Place 2 sprays into both nostrils daily.  16 g  6  . levocetirizine (XYZAL) 5 MG tablet Take 1 tablet (5 mg total) by mouth every evening.  30 tablet  6  . magnesium oxide (MAG-OX) 400 MG tablet Take 400 mg by mouth daily.      . metoprolol tartrate (LOPRESSOR) 25 MG tablet Take 0.5 tablets (12.5 mg total) by mouth 2 (two) times daily.  30 tablet  5  . pantoprazole (PROTONIX) 40 MG tablet Take 1 tablet (40 mg total) by mouth 2 (two) times daily.  60 tablet  5  . potassium chloride (MICRO-K) 10 MEQ CR capsule Take 1 capsule (10 mEq total) by mouth daily.  30 capsule  6  . simvastatin (ZOCOR) 20 MG tablet Take 1 tablet (20 mg total) by mouth at bedtime.  30 tablet  6  . torsemide (DEMADEX) 20 MG tablet Take 1 tablet (20 mg total) by mouth daily.  30 tablet  6  . valsartan (DIOVAN) 160 MG tablet Take 1 tablet (160 mg total) by  mouth daily.  30 tablet  5  . warfarin (COUMADIN) 5 MG tablet Take 1 tablet (5 mg total) by mouth as directed.  30 tablet  5   No facility-administered medications prior to visit.    PE: Blood pressure 134/80, pulse 71, temperature 98.6 F (37 C), temperature source Temporal, resp. rate 18, height 5\' 5"  (1.651 m), weight 192 lb (87.091 kg), SpO2 98.00%. Gen: Alert, well appearing.  Patient is oriented to person, place, time, and situation. CV: RRR, no m/r/g LUNGS: bibasilar early insp soft crackles that clear for the most part after several deep breaths.  Occ soft exp wheezing and some frequent exhalation coughing.  Nonlabored resps LEGS: 2+ bilat LE edema  IMPRESSION AND PLAN:  1) Cough; multifactorial.  PND cough but also with some chronic bronchitis-mild acute exacerbation. Will do pred 40 mg qd x 5d, then 20mg  qd x 5d. Start symbicort to continue for a couple of months to see how he feels. No indication for chest imaging at this time.  An After Visit Summary was printed and given to the patient.  FOLLOW UP: 17mo, f/u cough and get fast

## 2014-01-13 ENCOUNTER — Ambulatory Visit (INDEPENDENT_AMBULATORY_CARE_PROVIDER_SITE_OTHER): Payer: Medicare Other | Admitting: Pharmacist Clinician (PhC)/ Clinical Pharmacy Specialist

## 2014-01-13 DIAGNOSIS — Z7901 Long term (current) use of anticoagulants: Secondary | ICD-10-CM

## 2014-01-13 DIAGNOSIS — I4891 Unspecified atrial fibrillation: Secondary | ICD-10-CM | POA: Diagnosis not present

## 2014-01-13 LAB — POCT INR: INR: 3.4

## 2014-01-14 ENCOUNTER — Encounter: Payer: Self-pay | Admitting: Internal Medicine

## 2014-01-29 ENCOUNTER — Ambulatory Visit: Payer: Medicare Other | Admitting: Family Medicine

## 2014-02-01 ENCOUNTER — Encounter: Payer: Self-pay | Admitting: Family Medicine

## 2014-02-01 ENCOUNTER — Ambulatory Visit (INDEPENDENT_AMBULATORY_CARE_PROVIDER_SITE_OTHER): Payer: Medicare Other | Admitting: Family Medicine

## 2014-02-01 VITALS — BP 127/78 | HR 71 | Temp 97.8°F | Resp 18 | Ht 65.0 in | Wt 196.0 lb

## 2014-02-01 DIAGNOSIS — R05 Cough: Secondary | ICD-10-CM

## 2014-02-01 DIAGNOSIS — N182 Chronic kidney disease, stage 2 (mild): Secondary | ICD-10-CM | POA: Diagnosis not present

## 2014-02-01 DIAGNOSIS — I2510 Atherosclerotic heart disease of native coronary artery without angina pectoris: Secondary | ICD-10-CM

## 2014-02-01 DIAGNOSIS — I10 Essential (primary) hypertension: Secondary | ICD-10-CM | POA: Diagnosis not present

## 2014-02-01 DIAGNOSIS — I482 Chronic atrial fibrillation, unspecified: Secondary | ICD-10-CM

## 2014-02-01 DIAGNOSIS — I4891 Unspecified atrial fibrillation: Secondary | ICD-10-CM

## 2014-02-01 DIAGNOSIS — D649 Anemia, unspecified: Secondary | ICD-10-CM

## 2014-02-01 DIAGNOSIS — R059 Cough, unspecified: Secondary | ICD-10-CM

## 2014-02-01 DIAGNOSIS — Z7901 Long term (current) use of anticoagulants: Secondary | ICD-10-CM

## 2014-02-01 DIAGNOSIS — R053 Chronic cough: Secondary | ICD-10-CM

## 2014-02-01 DIAGNOSIS — E785 Hyperlipidemia, unspecified: Secondary | ICD-10-CM

## 2014-02-01 DIAGNOSIS — R040 Epistaxis: Secondary | ICD-10-CM

## 2014-02-01 DIAGNOSIS — Z5181 Encounter for therapeutic drug level monitoring: Secondary | ICD-10-CM

## 2014-02-01 LAB — CBC WITH DIFFERENTIAL/PLATELET
Basophils Absolute: 0 10*3/uL (ref 0.0–0.1)
Basophils Relative: 0.2 % (ref 0.0–3.0)
EOS ABS: 0.7 10*3/uL (ref 0.0–0.7)
Eosinophils Relative: 7.3 % — ABNORMAL HIGH (ref 0.0–5.0)
HCT: 37.6 % — ABNORMAL LOW (ref 39.0–52.0)
Hemoglobin: 12.2 g/dL — ABNORMAL LOW (ref 13.0–17.0)
Lymphocytes Relative: 13.2 % (ref 12.0–46.0)
Lymphs Abs: 1.3 10*3/uL (ref 0.7–4.0)
MCHC: 32.3 g/dL (ref 30.0–36.0)
MCV: 89.8 fl (ref 78.0–100.0)
Monocytes Absolute: 0.8 10*3/uL (ref 0.1–1.0)
Monocytes Relative: 8.8 % (ref 3.0–12.0)
NEUTROS PCT: 70.5 % (ref 43.0–77.0)
Neutro Abs: 6.7 10*3/uL (ref 1.4–7.7)
Platelets: 260 10*3/uL (ref 150.0–400.0)
RBC: 4.19 Mil/uL — ABNORMAL LOW (ref 4.22–5.81)
RDW: 17.3 % — ABNORMAL HIGH (ref 11.5–15.5)
WBC: 9.5 10*3/uL (ref 4.0–10.5)

## 2014-02-01 LAB — LIPID PANEL
Cholesterol: 140 mg/dL (ref 0–200)
HDL: 41.2 mg/dL (ref >39.00)
LDL Cholesterol: 77 mg/dL (ref 0–99)
NonHDL: 98.8
TRIGLYCERIDES: 111 mg/dL (ref 0.0–149.0)
Total CHOL/HDL Ratio: 3
VLDL: 22.2 mg/dL (ref 0.0–40.0)

## 2014-02-01 LAB — COMPREHENSIVE METABOLIC PANEL
ALT: 17 U/L (ref 0–53)
AST: 24 U/L (ref 0–37)
Albumin: 3.8 g/dL (ref 3.5–5.2)
Alkaline Phosphatase: 72 U/L (ref 39–117)
BUN: 22 mg/dL (ref 6–23)
CALCIUM: 9.9 mg/dL (ref 8.4–10.5)
CHLORIDE: 101 meq/L (ref 96–112)
CO2: 23 mEq/L (ref 19–32)
Creatinine, Ser: 1.5 mg/dL (ref 0.4–1.5)
GFR: 48.93 mL/min — ABNORMAL LOW (ref >60.00)
GLUCOSE: 95 mg/dL (ref 70–99)
Potassium: 4.1 mEq/L (ref 3.5–5.1)
SODIUM: 137 meq/L (ref 135–145)
Total Bilirubin: 0.6 mg/dL (ref 0.2–1.2)
Total Protein: 6.7 g/dL (ref 6.0–8.3)

## 2014-02-01 MED ORDER — BUDESONIDE-FORMOTEROL FUMARATE 80-4.5 MCG/ACT IN AERO: 2.0000 | INHALATION_SPRAY | Freq: Two times a day (BID) | RESPIRATORY_TRACT | Status: DC

## 2014-02-01 NOTE — Progress Notes (Signed)
Pre visit review using our clinic review tool, if applicable. No additional management support is needed unless otherwise documented below in the visit note. 
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
## 2014-02-02 ENCOUNTER — Ambulatory Visit: Payer: Medicare Other

## 2014-02-02 DIAGNOSIS — D649 Anemia, unspecified: Secondary | ICD-10-CM

## 2014-02-02 LAB — IBC PANEL
IRON: 38 ug/dL — AB (ref 42–165)
Saturation Ratios: 8.7 % — ABNORMAL LOW (ref 20.0–50.0)
TRANSFERRIN: 310.3 mg/dL (ref 212.0–360.0)

## 2014-02-02 LAB — FERRITIN: Ferritin: 151.4 ng/mL (ref 22.0–322.0)

## 2014-02-02 NOTE — Addendum Note (Signed)
Addended by: Cydney Ok on: 02/02/2014 08:40 AM   Modules accepted: Orders

## 2014-02-02 NOTE — Addendum Note (Signed)
Addended by: Cydney Ok on: 02/02/2014 08:43 AM   Modules accepted: Orders

## 2014-02-03 ENCOUNTER — Ambulatory Visit (INDEPENDENT_AMBULATORY_CARE_PROVIDER_SITE_OTHER): Payer: Medicare Other | Admitting: Pharmacist Clinician (PhC)/ Clinical Pharmacy Specialist

## 2014-02-03 ENCOUNTER — Encounter: Payer: Self-pay | Admitting: Family Medicine

## 2014-02-03 DIAGNOSIS — I4891 Unspecified atrial fibrillation: Secondary | ICD-10-CM

## 2014-02-03 DIAGNOSIS — Z7901 Long term (current) use of anticoagulants: Secondary | ICD-10-CM

## 2014-02-03 LAB — POCT INR: INR: 2.4

## 2014-03-03 ENCOUNTER — Ambulatory Visit (INDEPENDENT_AMBULATORY_CARE_PROVIDER_SITE_OTHER): Payer: Medicare Other | Admitting: Pharmacist Clinician (PhC)/ Clinical Pharmacy Specialist

## 2014-03-03 DIAGNOSIS — I4891 Unspecified atrial fibrillation: Secondary | ICD-10-CM | POA: Diagnosis not present

## 2014-03-03 DIAGNOSIS — Z7901 Long term (current) use of anticoagulants: Secondary | ICD-10-CM

## 2014-03-17 ENCOUNTER — Other Ambulatory Visit: Payer: Self-pay | Admitting: Cardiovascular Disease

## 2014-03-17 NOTE — Telephone Encounter (Signed)
E sent to pharmacy 

## 2014-03-26 ENCOUNTER — Ambulatory Visit (INDEPENDENT_AMBULATORY_CARE_PROVIDER_SITE_OTHER): Payer: Medicare Other | Admitting: *Deleted

## 2014-03-26 DIAGNOSIS — I482 Chronic atrial fibrillation, unspecified: Secondary | ICD-10-CM

## 2014-03-26 DIAGNOSIS — Z95810 Presence of automatic (implantable) cardiac defibrillator: Secondary | ICD-10-CM

## 2014-03-26 DIAGNOSIS — I442 Atrioventricular block, complete: Secondary | ICD-10-CM | POA: Diagnosis not present

## 2014-03-26 LAB — MDC_IDC_ENUM_SESS_TYPE_INCLINIC
HighPow Impedance: 39 Ohm
Lead Channel Impedance Value: 1029 Ohm
Lead Channel Pacing Threshold Amplitude: 0.4 V
Lead Channel Pacing Threshold Pulse Width: 0.4 ms
Lead Channel Sensing Intrinsic Amplitude: 10.3 mV
Lead Channel Setting Pacing Amplitude: 2.4 V
Lead Channel Setting Pacing Pulse Width: 0.4 ms
MDC IDC MSMT BATTERY VOLTAGE: 2.89 V
MDC IDC PG SERIAL: 503349
MDC IDC SET ZONE DETECTION INTERVAL: 250 ms
MDC IDC SET ZONE DETECTION INTERVAL: 300 ms
MDC IDC SET ZONE DETECTION INTERVAL: 375 ms
MDC IDC STAT BRADY RV PERCENT PACED: 99 %

## 2014-03-26 NOTE — Progress Notes (Signed)
ICD check in clinic. Normal device function. Threshold and sensing consistent with previous device measurements. Impedance trends stable over time. No evidence of any ventricular arrhythmias. Histogram distribution appropriate for patient and level of activity. No changes made this session. Device programmed at appropriate safety margins. Device programmed to optimize intrinsic conduction. Battery status MOL1, 2.89V. ROV w/ Dr. Graciela Husbands 07/09/14.

## 2014-03-31 ENCOUNTER — Ambulatory Visit (INDEPENDENT_AMBULATORY_CARE_PROVIDER_SITE_OTHER): Payer: Medicare Other | Admitting: Pharmacist Clinician (PhC)/ Clinical Pharmacy Specialist

## 2014-03-31 DIAGNOSIS — Z7901 Long term (current) use of anticoagulants: Secondary | ICD-10-CM | POA: Diagnosis not present

## 2014-03-31 DIAGNOSIS — I4891 Unspecified atrial fibrillation: Secondary | ICD-10-CM | POA: Diagnosis not present

## 2014-03-31 LAB — POCT INR: INR: 2.1

## 2014-04-07 ENCOUNTER — Other Ambulatory Visit: Payer: Self-pay

## 2014-04-07 ENCOUNTER — Telehealth: Payer: Self-pay

## 2014-04-07 MED ORDER — ALLOPURINOL 300 MG PO TABS: 100.0000 mg | ORAL_TABLET | Freq: Every day | ORAL | Status: DC

## 2014-04-08 ENCOUNTER — Other Ambulatory Visit: Payer: Self-pay | Admitting: *Deleted

## 2014-04-08 MED ORDER — ALLOPURINOL 300 MG PO TABS: ORAL_TABLET | ORAL | Status: DC

## 2014-04-08 NOTE — Telephone Encounter (Signed)
refill 

## 2014-04-21 ENCOUNTER — Encounter: Payer: Self-pay | Admitting: Internal Medicine

## 2014-05-10 ENCOUNTER — Other Ambulatory Visit: Payer: Self-pay | Admitting: Internal Medicine

## 2014-05-12 ENCOUNTER — Ambulatory Visit (INDEPENDENT_AMBULATORY_CARE_PROVIDER_SITE_OTHER): Payer: Medicare Other | Admitting: Pharmacist Clinician (PhC)/ Clinical Pharmacy Specialist

## 2014-05-12 DIAGNOSIS — Z7901 Long term (current) use of anticoagulants: Secondary | ICD-10-CM

## 2014-05-12 DIAGNOSIS — I4891 Unspecified atrial fibrillation: Secondary | ICD-10-CM | POA: Diagnosis not present

## 2014-05-12 LAB — POCT INR: INR: 1.9

## 2014-06-03 ENCOUNTER — Ambulatory Visit (INDEPENDENT_AMBULATORY_CARE_PROVIDER_SITE_OTHER): Payer: Medicare Other | Admitting: Family Medicine

## 2014-06-03 ENCOUNTER — Encounter: Payer: Self-pay | Admitting: Family Medicine

## 2014-06-03 VITALS — BP 122/78 | HR 80 | Temp 97.8°F | Ht 65.0 in | Wt 205.0 lb

## 2014-06-03 DIAGNOSIS — E785 Hyperlipidemia, unspecified: Secondary | ICD-10-CM | POA: Diagnosis not present

## 2014-06-03 DIAGNOSIS — I482 Chronic atrial fibrillation, unspecified: Secondary | ICD-10-CM

## 2014-06-03 DIAGNOSIS — L989 Disorder of the skin and subcutaneous tissue, unspecified: Secondary | ICD-10-CM

## 2014-06-03 DIAGNOSIS — I10 Essential (primary) hypertension: Secondary | ICD-10-CM | POA: Diagnosis not present

## 2014-06-03 DIAGNOSIS — N183 Chronic kidney disease, stage 3 unspecified: Secondary | ICD-10-CM

## 2014-06-03 LAB — BASIC METABOLIC PANEL
BUN: 22 mg/dL (ref 6–23)
CO2: 30 mEq/L (ref 19–32)
CREATININE: 1.41 mg/dL (ref 0.40–1.50)
Calcium: 10.3 mg/dL (ref 8.4–10.5)
Chloride: 98 mEq/L (ref 96–112)
GFR: 52.5 mL/min — AB (ref >60.00)
GLUCOSE: 88 mg/dL (ref 70–99)
Potassium: 4.5 mEq/L (ref 3.5–5.1)
Sodium: 134 mEq/L — ABNORMAL LOW (ref 135–145)

## 2014-06-03 LAB — MAGNESIUM: Magnesium: 2 mg/dL (ref 1.5–2.5)

## 2014-06-03 MED ORDER — FLUTICASONE PROPIONATE 0.05 % EX CREA: TOPICAL_CREAM | CUTANEOUS | Status: DC

## 2014-06-03 NOTE — Progress Notes (Signed)
Pre visit review using our clinic review tool, if applicable. No additional management support is needed unless otherwise documented below in the visit note. 
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
## 2014-06-04 ENCOUNTER — Telehealth: Payer: Self-pay | Admitting: Family Medicine

## 2014-06-04 NOTE — Telephone Encounter (Signed)
emmi mailed  °

## 2014-06-14 ENCOUNTER — Other Ambulatory Visit: Payer: Self-pay | Admitting: Family Medicine

## 2014-06-14 ENCOUNTER — Other Ambulatory Visit: Payer: Self-pay | Admitting: Cardiovascular Disease

## 2014-06-15 NOTE — Telephone Encounter (Signed)
Ok to fill for 1 month. Patient sees [place] next month and can address further refills then.

## 2014-06-15 NOTE — Telephone Encounter (Signed)
Did Dr Graciela Husbands taking over refilling these? They have Dr Renelda Mom name on them, but his office forwarded it to [place]. I don't think he sees him anymore. Please advise. Thanks, MI

## 2014-06-23 ENCOUNTER — Ambulatory Visit (INDEPENDENT_AMBULATORY_CARE_PROVIDER_SITE_OTHER): Payer: Medicare Other | Admitting: Pharmacist Clinician (PhC)/ Clinical Pharmacy Specialist

## 2014-06-23 DIAGNOSIS — I4891 Unspecified atrial fibrillation: Secondary | ICD-10-CM

## 2014-06-23 DIAGNOSIS — Z7901 Long term (current) use of anticoagulants: Secondary | ICD-10-CM | POA: Diagnosis not present

## 2014-06-23 LAB — POCT INR: INR: 2

## 2014-07-09 ENCOUNTER — Ambulatory Visit (INDEPENDENT_AMBULATORY_CARE_PROVIDER_SITE_OTHER): Payer: Medicare Other | Admitting: Internal Medicine

## 2014-07-09 ENCOUNTER — Encounter: Payer: Self-pay | Admitting: Internal Medicine

## 2014-07-09 VITALS — BP 142/70 | HR 73 | Ht 62.0 in | Wt 211.2 lb

## 2014-07-09 DIAGNOSIS — Z4502 Encounter for adjustment and management of automatic implantable cardiac defibrillator: Secondary | ICD-10-CM | POA: Diagnosis not present

## 2014-07-09 DIAGNOSIS — I469 Cardiac arrest, cause unspecified: Secondary | ICD-10-CM

## 2014-07-09 DIAGNOSIS — I482 Chronic atrial fibrillation, unspecified: Secondary | ICD-10-CM

## 2014-07-09 LAB — MDC_IDC_ENUM_SESS_TYPE_INCLINIC
Brady Statistic RV Percent Paced: 100 %
Date Time Interrogation Session: 20160219050000
HIGH POWER IMPEDANCE MEASURED VALUE: 40 Ohm
HIGH POWER IMPEDANCE MEASURED VALUE: 42 Ohm
Implantable Pulse Generator Serial Number: 503349
Lead Channel Impedance Value: 1093 Ohm
Lead Channel Impedance Value: 3000 Ohm
Lead Channel Pacing Threshold Amplitude: 0.4 V
Lead Channel Pacing Threshold Amplitude: 0.4 V
Lead Channel Pacing Threshold Pulse Width: 0.5 ms
Lead Channel Sensing Intrinsic Amplitude: 13.9 mV
Lead Channel Setting Pacing Amplitude: 2.4 V
MDC IDC MSMT BATTERY VOLTAGE: 2.87 V
MDC IDC MSMT LEADCHNL RV PACING THRESHOLD PULSEWIDTH: 0.4 ms
MDC IDC SET LEADCHNL RV PACING PULSEWIDTH: 0.4 ms
MDC IDC SET ZONE DETECTION INTERVAL: 250 ms
MDC IDC SET ZONE DETECTION INTERVAL: 375 ms
MDC IDC STAT BRADY RA PERCENT PACED: 0 %
Zone Setting Detection Interval: 300 ms

## 2014-07-09 MED ORDER — POTASSIUM CHLORIDE ER 10 MEQ PO CPCR: 20.0000 meq | ORAL_CAPSULE | Freq: Two times a day (BID) | ORAL | Status: DC

## 2014-07-09 MED ORDER — TORSEMIDE 20 MG PO TABS: 20.0000 mg | ORAL_TABLET | Freq: Two times a day (BID) | ORAL | Status: DC

## 2014-07-09 MED ORDER — POTASSIUM CHLORIDE ER 10 MEQ PO CPCR: 20.0000 meq | ORAL_CAPSULE | Freq: Every day | ORAL | Status: DC

## 2014-07-09 NOTE — Patient Instructions (Addendum)
Your physician has recommended you make the following change in your medication:  1) Take Torsemide as follows: 40 mg one day, next day take 20 mg, next day 40 mg, next day 20 mg --Alternate this for 2 weeks.  Then take 20 mg twice daily. 2) INCREASE Potassium to 20 mEq twice daily  Your physician recommends that you return for lab work in: 2 weeks for BMET  Your physician recommends that you schedule a follow-up appointment in: 2 weeks with PA/NP.  Your physician recommends that you schedule a follow-up appointment in: 3 months with device clinic.  Your physician wants you to follow-up in: 1 year with Dr. Graciela Husbands.  You will receive a reminder letter in the mail two months in advance. If you don't receive a letter, please call our office to schedule the follow-up appointment.
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
## 2014-07-10 ENCOUNTER — Other Ambulatory Visit: Payer: Self-pay | Admitting: Cardiovascular Disease

## 2014-07-10 ENCOUNTER — Other Ambulatory Visit: Payer: Self-pay | Admitting: Family Medicine

## 2014-07-16 ENCOUNTER — Other Ambulatory Visit: Payer: Self-pay

## 2014-07-16 MED ORDER — SIMVASTATIN 20 MG PO TABS: 20.0000 mg | ORAL_TABLET | Freq: Every day | ORAL | Status: DC

## 2014-07-22 ENCOUNTER — Encounter: Payer: Self-pay | Admitting: Cardiology

## 2014-07-22 ENCOUNTER — Ambulatory Visit (INDEPENDENT_AMBULATORY_CARE_PROVIDER_SITE_OTHER): Payer: Medicare Other | Admitting: Cardiology

## 2014-07-22 ENCOUNTER — Other Ambulatory Visit (INDEPENDENT_AMBULATORY_CARE_PROVIDER_SITE_OTHER): Payer: Medicare Other | Admitting: *Deleted

## 2014-07-22 VITALS — BP 150/72 | HR 92 | Ht 62.0 in | Wt 211.0 lb

## 2014-07-22 DIAGNOSIS — I482 Chronic atrial fibrillation, unspecified: Secondary | ICD-10-CM

## 2014-07-22 DIAGNOSIS — L989 Disorder of the skin and subcutaneous tissue, unspecified: Secondary | ICD-10-CM

## 2014-07-22 DIAGNOSIS — G4730 Sleep apnea, unspecified: Secondary | ICD-10-CM | POA: Diagnosis not present

## 2014-07-22 DIAGNOSIS — I469 Cardiac arrest, cause unspecified: Secondary | ICD-10-CM | POA: Diagnosis not present

## 2014-07-22 DIAGNOSIS — Z7901 Long term (current) use of anticoagulants: Secondary | ICD-10-CM

## 2014-07-22 DIAGNOSIS — Z95810 Presence of automatic (implantable) cardiac defibrillator: Secondary | ICD-10-CM

## 2014-07-22 DIAGNOSIS — E669 Obesity, unspecified: Secondary | ICD-10-CM | POA: Insufficient documentation

## 2014-07-22 LAB — BASIC METABOLIC PANEL
BUN: 25 mg/dL — ABNORMAL HIGH (ref 6–23)
CHLORIDE: 99 meq/L (ref 96–112)
CO2: 33 mEq/L — ABNORMAL HIGH (ref 19–32)
Calcium: 10.3 mg/dL (ref 8.4–10.5)
Creatinine, Ser: 1.44 mg/dL (ref 0.40–1.50)
GFR: 51.22 mL/min — ABNORMAL LOW (ref >60.00)
Glucose, Bld: 87 mg/dL (ref 70–99)
POTASSIUM: 4.5 meq/L (ref 3.5–5.1)
Sodium: 135 mEq/L (ref 135–145)

## 2014-07-22 MED ORDER — FUROSEMIDE 40 MG PO TABS: ORAL_TABLET | ORAL | Status: DC

## 2014-07-22 MED ORDER — METOLAZONE 2.5 MG PO TABS: ORAL_TABLET | ORAL | Status: DC

## 2014-07-22 NOTE — Assessment & Plan Note (Signed)
On Coumadin, s/p AVN RFA 2001
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
## 2014-07-22 NOTE — Assessment & Plan Note (Signed)
In Feb 2000, ICD placed, minor CAD at cath then

## 2014-07-22 NOTE — Assessment & Plan Note (Signed)
ICD implanted 2000, 2004, and 2008 (BS)

## 2014-07-22 NOTE — Assessment & Plan Note (Signed)
Controlled, echo Jan 2014 mild LVH EF 45-50%

## 2014-07-22 NOTE — Assessment & Plan Note (Signed)
BMI 38 
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
## 2014-07-22 NOTE — Assessment & Plan Note (Signed)
He had a sleep study in the past and di not want to do the second part. He says he doesn't want to wear the mask

## 2014-07-22 NOTE — Assessment & Plan Note (Signed)
Coumadin, followed in [facility]

## 2014-07-22 NOTE — Patient Instructions (Addendum)
Your physician has recommended you make the following change in your medication:  1) START taking Furosemide 40mg  daily x 3 days and, 2) START taking Zaroxolyn 2.5mg  daily x 3 days then discontinue. (Take Zaroxolyn 30 minutes prior to taking Furosemide.) 3) START taking Demadex again once you have stopped Furosemide and Zaroxolyn. DO NOT take Demadex while taking Furosemide and Zaroxolyn.  Your physician recommends that you schedule a follow-up appointment in: 3 months with Corine Shelter, PA.

## 2014-07-22 NOTE — Assessment & Plan Note (Signed)
2001 

## 2014-07-27 ENCOUNTER — Telehealth: Payer: Self-pay | Admitting: Internal Medicine

## 2014-07-27 NOTE — Telephone Encounter (Signed)
New message      Pt is confused about dosage on his metoprolol 25mg .  With Dr Alanda Amass he was taking 1 tablet daily.  The new presc says 1tab twice a day.  He request to talk to a nurse.  OK to leave msg on vm

## 2014-07-27 NOTE — Telephone Encounter (Signed)
Informed patient that our records show him taking 1/2 tablet (12.5 mg total) BID.  Patient states this is how he has been taking it and wanted to confirm this was correct.

## 2014-08-03 ENCOUNTER — Encounter: Payer: Self-pay | Admitting: Internal Medicine

## 2014-08-04 ENCOUNTER — Ambulatory Visit (INDEPENDENT_AMBULATORY_CARE_PROVIDER_SITE_OTHER): Payer: Medicare Other | Admitting: Pharmacist Clinician (PhC)/ Clinical Pharmacy Specialist

## 2014-08-04 DIAGNOSIS — I4821 Permanent atrial fibrillation: Secondary | ICD-10-CM

## 2014-08-04 DIAGNOSIS — I4891 Unspecified atrial fibrillation: Secondary | ICD-10-CM | POA: Diagnosis not present

## 2014-08-04 DIAGNOSIS — I482 Chronic atrial fibrillation: Secondary | ICD-10-CM

## 2014-08-04 DIAGNOSIS — Z7901 Long term (current) use of anticoagulants: Secondary | ICD-10-CM | POA: Diagnosis not present

## 2014-08-04 LAB — POCT INR: INR: 2.3

## 2014-08-23 ENCOUNTER — Other Ambulatory Visit: Payer: Self-pay | Admitting: Internal Medicine

## 2014-09-13 ENCOUNTER — Other Ambulatory Visit: Payer: Self-pay | Admitting: Family Medicine

## 2014-09-13 ENCOUNTER — Other Ambulatory Visit: Payer: Self-pay | Admitting: Cardiology

## 2014-09-15 ENCOUNTER — Ambulatory Visit (INDEPENDENT_AMBULATORY_CARE_PROVIDER_SITE_OTHER): Payer: Medicare Other | Admitting: Pharmacist Clinician (PhC)/ Clinical Pharmacy Specialist

## 2014-09-15 DIAGNOSIS — I482 Chronic atrial fibrillation: Secondary | ICD-10-CM

## 2014-09-15 DIAGNOSIS — I4821 Permanent atrial fibrillation: Secondary | ICD-10-CM

## 2014-09-15 DIAGNOSIS — I4891 Unspecified atrial fibrillation: Secondary | ICD-10-CM | POA: Diagnosis not present

## 2014-09-15 DIAGNOSIS — Z7901 Long term (current) use of anticoagulants: Secondary | ICD-10-CM

## 2014-09-15 LAB — POCT INR: INR: 3.1

## 2014-09-17 ENCOUNTER — Encounter: Payer: Self-pay | Admitting: Internal Medicine

## 2014-09-29 ENCOUNTER — Other Ambulatory Visit: Payer: Self-pay | Admitting: Cardiology

## 2014-10-01 ENCOUNTER — Ambulatory Visit (HOSPITAL_COMMUNITY)
Admission: RE | Admit: 2014-10-01 | Discharge: 2014-10-01 | Disposition: A | Discharge status: Home / Self Care | Payer: Medicare Other | Source: Ambulatory Visit | Attending: Family Medicine | Admitting: Family Medicine

## 2014-10-01 ENCOUNTER — Ambulatory Visit (INDEPENDENT_AMBULATORY_CARE_PROVIDER_SITE_OTHER): Payer: Medicare Other | Admitting: Family Medicine

## 2014-10-01 ENCOUNTER — Encounter: Payer: Self-pay | Admitting: Family Medicine

## 2014-10-01 VITALS — BP 93/63 | HR 70 | Temp 97.6°F | Resp 16 | Wt 201.0 lb

## 2014-10-01 DIAGNOSIS — R188 Other ascites: Secondary | ICD-10-CM

## 2014-10-01 DIAGNOSIS — R109 Unspecified abdominal pain: Secondary | ICD-10-CM

## 2014-10-01 DIAGNOSIS — R05 Cough: Secondary | ICD-10-CM | POA: Diagnosis not present

## 2014-10-01 DIAGNOSIS — I10 Essential (primary) hypertension: Secondary | ICD-10-CM

## 2014-10-01 DIAGNOSIS — I517 Cardiomegaly: Secondary | ICD-10-CM | POA: Diagnosis not present

## 2014-10-01 DIAGNOSIS — I4891 Unspecified atrial fibrillation: Secondary | ICD-10-CM | POA: Insufficient documentation

## 2014-10-01 DIAGNOSIS — R053 Chronic cough: Secondary | ICD-10-CM

## 2014-10-01 DIAGNOSIS — Z8674 Personal history of sudden cardiac arrest: Secondary | ICD-10-CM | POA: Insufficient documentation

## 2014-10-01 DIAGNOSIS — R609 Edema, unspecified: Secondary | ICD-10-CM | POA: Diagnosis not present

## 2014-10-01 LAB — COMPREHENSIVE METABOLIC PANEL
ALT: 13 U/L (ref 0–53)
AST: 19 U/L (ref 0–37)
Albumin: 4 g/dL (ref 3.5–5.2)
Alkaline Phosphatase: 79 U/L (ref 39–117)
BUN: 28 mg/dL — AB (ref 6–23)
CALCIUM: 10 mg/dL (ref 8.4–10.5)
CHLORIDE: 99 meq/L (ref 96–112)
CO2: 30 mEq/L (ref 19–32)
Creatinine, Ser: 1.63 mg/dL — ABNORMAL HIGH (ref 0.40–1.50)
GFR: 44.37 mL/min — AB (ref >60.00)
GLUCOSE: 98 mg/dL (ref 70–99)
Potassium: 4.3 mEq/L (ref 3.5–5.1)
SODIUM: 136 meq/L (ref 135–145)
Total Bilirubin: 0.7 mg/dL (ref 0.2–1.2)
Total Protein: 6.3 g/dL (ref 6.0–8.3)

## 2014-10-01 MED ORDER — PREDNISONE 20 MG PO TABS: ORAL_TABLET | ORAL | Status: DC

## 2014-10-01 NOTE — Progress Notes (Signed)
Pre visit review using our clinic review tool, if applicable. No additional management support is needed unless otherwise documented below in the visit note. 

## 2014-10-01 NOTE — Telephone Encounter (Signed)
Per note 3.30.16
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
## 2014-10-04 ENCOUNTER — Ambulatory Visit (HOSPITAL_COMMUNITY)
Admission: RE | Admit: 2014-10-04 | Discharge: 2014-10-04 | Disposition: A | Discharge status: Home / Self Care | Payer: Medicare Other | Source: Ambulatory Visit | Attending: Family Medicine | Admitting: Family Medicine

## 2014-10-04 DIAGNOSIS — R109 Unspecified abdominal pain: Secondary | ICD-10-CM | POA: Insufficient documentation

## 2014-10-04 DIAGNOSIS — N2889 Other specified disorders of kidney and ureter: Secondary | ICD-10-CM | POA: Diagnosis not present

## 2014-10-08 ENCOUNTER — Ambulatory Visit (INDEPENDENT_AMBULATORY_CARE_PROVIDER_SITE_OTHER): Payer: Medicare Other | Admitting: *Deleted

## 2014-10-08 DIAGNOSIS — I4821 Permanent atrial fibrillation: Secondary | ICD-10-CM

## 2014-10-08 DIAGNOSIS — I482 Chronic atrial fibrillation: Secondary | ICD-10-CM | POA: Diagnosis not present

## 2014-10-08 DIAGNOSIS — I4891 Unspecified atrial fibrillation: Secondary | ICD-10-CM | POA: Diagnosis not present

## 2014-10-08 DIAGNOSIS — Z95810 Presence of automatic (implantable) cardiac defibrillator: Secondary | ICD-10-CM | POA: Diagnosis not present

## 2014-10-08 DIAGNOSIS — I442 Atrioventricular block, complete: Secondary | ICD-10-CM

## 2014-10-08 DIAGNOSIS — I469 Cardiac arrest, cause unspecified: Secondary | ICD-10-CM

## 2014-10-08 LAB — CUP PACEART INCLINIC DEVICE CHECK
Battery Voltage: 2.86 V
Date Time Interrogation Session: 20160520130844
HIGH POWER IMPEDANCE MEASURED VALUE: 46 Ohm
Lead Channel Pacing Threshold Amplitude: 0.4 V
Lead Channel Pacing Threshold Pulse Width: 0.4 ms
MDC IDC MSMT LEADCHNL RV IMPEDANCE VALUE: 1140 Ohm
MDC IDC SET LEADCHNL RV PACING AMPLITUDE: 2.4 V
MDC IDC SET LEADCHNL RV PACING PULSEWIDTH: 0.4 ms
MDC IDC SET ZONE DETECTION INTERVAL: 250 ms
MDC IDC STAT BRADY RV PERCENT PACED: 99 %
Pulse Gen Serial Number: 503349
Zone Setting Detection Interval: 300 ms
Zone Setting Detection Interval: 375 ms

## 2014-10-08 NOTE — Progress Notes (Signed)
ICD check in clinic. Normal device function. Threshold and sensing consistent with previous device measurements. Impedance trends stable over time. No evidence of any ventricular arrhythmias. Histogram distribution appropriate for patient and level of activity. Device programmed at appropriate safety margins. Device programmed to optimize intrinsic conduction. Battery status MOL1. ROV w/ device clinic 01/13/15 & w/ SK in 36mo.

## 2014-10-09 ENCOUNTER — Encounter: Payer: Self-pay | Admitting: Family Medicine

## 2014-10-27 ENCOUNTER — Ambulatory Visit (INDEPENDENT_AMBULATORY_CARE_PROVIDER_SITE_OTHER): Payer: Medicare Other | Admitting: Pharmacist Clinician (PhC)/ Clinical Pharmacy Specialist

## 2014-10-27 DIAGNOSIS — I482 Chronic atrial fibrillation: Secondary | ICD-10-CM | POA: Diagnosis not present

## 2014-10-27 DIAGNOSIS — Z7901 Long term (current) use of anticoagulants: Secondary | ICD-10-CM | POA: Diagnosis not present

## 2014-10-27 DIAGNOSIS — I4821 Permanent atrial fibrillation: Secondary | ICD-10-CM

## 2014-10-27 LAB — POCT INR: INR: 2.8

## 2014-11-04 ENCOUNTER — Encounter: Payer: Self-pay | Admitting: Family Medicine

## 2014-11-04 ENCOUNTER — Ambulatory Visit (INDEPENDENT_AMBULATORY_CARE_PROVIDER_SITE_OTHER): Payer: Medicare Other | Admitting: Family Medicine

## 2014-11-04 VITALS — BP 119/77 | HR 71 | Temp 97.8°F | Resp 16 | Wt 204.0 lb

## 2014-11-04 DIAGNOSIS — N183 Chronic kidney disease, stage 3 unspecified: Secondary | ICD-10-CM

## 2014-11-04 DIAGNOSIS — N189 Chronic kidney disease, unspecified: Secondary | ICD-10-CM | POA: Diagnosis not present

## 2014-11-04 LAB — BASIC METABOLIC PANEL
BUN: 27 mg/dL — ABNORMAL HIGH (ref 6–23)
CALCIUM: 9.7 mg/dL (ref 8.4–10.5)
CHLORIDE: 98 meq/L (ref 96–112)
CO2: 29 meq/L (ref 19–32)
Creatinine, Ser: 1.5 mg/dL (ref 0.40–1.50)
GFR: 48.82 mL/min — ABNORMAL LOW (ref >60.00)
GLUCOSE: 80 mg/dL (ref 70–99)
Potassium: 4.4 mEq/L (ref 3.5–5.1)
Sodium: 134 mEq/L — ABNORMAL LOW (ref 135–145)

## 2014-11-04 NOTE — Progress Notes (Signed)
OFFICE NOTE  11/04/2014  CC:  Chief Complaint  Patient presents with  . Follow-up    4 month f/u. Pt is not fasting.     HPI: Patient is a 74 y.o. Caucasian male who is here for 1 mo f/u chronic cough which I believe to be upper airway cough syndrome.  However, last visit I tried a round of systemic steroids to see if this would help b/c pt insisted that it had in the past.   Also, labs done last visit showed mild worsening of CRI (he had been on increased dosing of his diuretics prior to this testing)--needs recheck of BMET today since he has backed off on his diuretic dosing now. Has some recurrent abd pain problems which have been difficult to figure out.  Abd u/s after last visit-NORMAL.  UPDATE: cough is almost completely gone, just a bit of morning cough still. No SOB, no fevers, no wheezing.  No CP.    Pertinent PMH:  Past medical, surgical, social, and family history reviewed and no changes are noted since last office visit.  MEDS:  Taking torsemide 20mg  qd alt/w 40mg  qd Currently not taking any lasix. Takes potassium qd alt/w 2 qd Outpatient Prescriptions Prior to Visit  Medication Sig Dispense Refill  . allopurinol (ZYLOPRIM) 300 MG tablet Take 1/2 tablet (150 mg) by mouth once daily. 15 tablet 6  . cholecalciferol (VITAMIN D) 1000 UNITS tablet Take 1,000 Units by mouth daily.    . fluticasone (FLONASE) 50 MCG/ACT nasal spray USE 2 SPRAYS IN EACH NOSTRIL DAILY (Patient taking differently: PRN) 16 g 3  . levocetirizine (XYZAL) 5 MG tablet Take 1 tablet (5 mg total) by mouth every evening. 30 tablet 3  . magnesium oxide (MAG-OX) 400 MG tablet Take 400 mg by mouth daily.    . metoprolol tartrate (LOPRESSOR) 25 MG tablet TAKE ONE TABLET TWICE DAILY (Patient taking differently: TAKE 1/2 TABLET TWICE DAILY) 60 tablet 6  . pantoprazole (PROTONIX) 40 MG tablet Take 1 tablet (40 mg total) by mouth 2 (two) times daily. (Patient taking differently: Take 40 mg by mouth daily. ) 60  tablet 2  . potassium chloride (MICRO-K) 10 MEQ CR capsule Take 2 capsules (20 mEq total) by mouth 2 (two) times daily. (Patient taking differently: Take 20 mEq by mouth 2 (two) times daily. Take 1 per day when taking 1 torsemide per day. Take 2 per day when taking 2 torsemide per day.) 120 capsule 2  . predniSONE (DELTASONE) 20 MG tablet 2 tabs po qd x 5d, then 1 tab po qd x 5d 15 tablet 0  . simvastatin (ZOCOR) 20 MG tablet Take 1 tablet (20 mg total) by mouth at bedtime. 30 tablet 10  . torsemide (DEMADEX) 20 MG tablet Take 1 tablet (20 mg total) by mouth 2 (two) times daily. (Patient taking differently: Take 20 mg by mouth 2 (two) times daily. Take 1 per day. Every other day take 2 per day.) 60 tablet 6  . valsartan (DIOVAN) 160 MG tablet Take 1 tablet (160 mg total) by mouth daily. 30 tablet 3  . warfarin (COUMADIN) 5 MG tablet Take 1 tablet by mouth daily or as directed by coumadin clinic 30 tablet 4  . furosemide (LASIX) 40 MG tablet Take one tablet by mouth daily for three days. Do not take Demadex while on Furosemide. 3 tablet 0  . fluticasone (CUTIVATE) 0.05 % cream Apply to affected areas of leg twice a day as needed (Patient not taking: Reported  on 10/01/2014) 30 g 1  . metolazone (ZAROXOLYN) 2.5 MG tablet Take one tablet my mouth daily for three days.  Do not take Demadex while taking Zaroxolyn. (Patient not taking: Reported on 11/04/2014) 3 tablet 0   No facility-administered medications prior to visit.    PE: Blood pressure 119/77, pulse 71, temperature 97.8 F (36.6 C), temperature source Other (Comment), resp. rate 16, weight 204 lb (92.534 kg), SpO2 98 %. Gen: Alert, well appearing.  Patient is oriented to person, place, time, and situation. CV: RRR, distant S1 and S2.  No audible murmur. Chest is clear, no wheezing or rales. Normal symmetric air entry throughout both lung fields. No chest wall deformities or tenderness.    Chemistry      Component Value Date/Time   NA 136  10/01/2014 1410   K 4.3 10/01/2014 1410   CL 99 10/01/2014 1410   CO2 30 10/01/2014 1410   BUN 28* 10/01/2014 1410   CREATININE 1.63* 10/01/2014 1410   CREATININE 1.39* 01/02/2013 1137      Component Value Date/Time   CALCIUM 10.0 10/01/2014 1410   ALKPHOS 79 10/01/2014 1410   AST 19 10/01/2014 1410   ALT 13 10/01/2014 1410   BILITOT 0.7 10/01/2014 1410     Lab Results  Component Value Date   WBC 9.5 02/01/2014   HGB 12.2* 02/01/2014   HCT 37.6* 02/01/2014   MCV 89.8 02/01/2014   PLT 260.0 02/01/2014    IMPRESSION AND PLAN:  1) Upper airway cough syndrome: doing well. Continue all current chronic meds.  2) CRI, recheck BMET today to make sure recent bump in BUN/Cr is stable or back to baseline.  3) Chronic systolic heart failure: volume status appears good at this time. Continue torsemide at current dosing.  An After Visit Summary was printed and given to the patient.  FOLLOW UP: 6 mo

## 2014-11-04 NOTE — Progress Notes (Signed)
Pre visit review using our clinic review tool, if applicable. No additional management support is needed unless otherwise documented below in the visit note. 

## 2014-11-08 ENCOUNTER — Encounter: Payer: Self-pay | Admitting: Internal Medicine

## 2014-11-12 ENCOUNTER — Other Ambulatory Visit: Payer: Self-pay | Admitting: Internal Medicine

## 2014-11-18 ENCOUNTER — Encounter: Payer: Self-pay | Admitting: Physician Assistant

## 2014-11-18 ENCOUNTER — Ambulatory Visit (INDEPENDENT_AMBULATORY_CARE_PROVIDER_SITE_OTHER): Payer: Medicare Other | Admitting: Physician Assistant

## 2014-11-18 VITALS — BP 114/82 | HR 82 | Ht 63.0 in | Wt 208.4 lb

## 2014-11-18 DIAGNOSIS — Z79899 Other long term (current) drug therapy: Secondary | ICD-10-CM | POA: Diagnosis not present

## 2014-11-18 DIAGNOSIS — N183 Chronic kidney disease, stage 3 unspecified: Secondary | ICD-10-CM

## 2014-11-18 DIAGNOSIS — I5023 Acute on chronic systolic (congestive) heart failure: Secondary | ICD-10-CM | POA: Diagnosis not present
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
## 2014-11-18 NOTE — Patient Instructions (Signed)
Medication Instructions:   TONIGHT TAKE 1 DEMADEX AND POTASSIUM, THEN TAKE 2 DEMADEX AND POTASSIUM IN THE MORNINGS AND 1 DEMADEX AND POTASSIUM IN THE AFTERNOONS X 3 DAYS.  THEN GO BACK TO YOUR REGULAR DOSE.  Labwork:  Monday 11/29/14 [facility] 1ST FLOOR  Testing/Procedures:  NONE  Follow-Up:  3 MONTHS WITH Corine Shelter, PA

## 2014-11-29 ENCOUNTER — Telehealth: Payer: Self-pay | Admitting: Cardiology

## 2014-11-29 ENCOUNTER — Other Ambulatory Visit: Payer: Self-pay | Admitting: *Deleted

## 2014-11-29 DIAGNOSIS — Z79899 Other long term (current) drug therapy: Secondary | ICD-10-CM | POA: Diagnosis not present

## 2014-11-29 LAB — BASIC METABOLIC PANEL
BUN: 29 mg/dL — ABNORMAL HIGH (ref 6–23)
CALCIUM: 9.8 mg/dL (ref 8.4–10.5)
CO2: 30 mEq/L (ref 19–32)
Chloride: 99 mEq/L (ref 96–112)
Creat: 1.39 mg/dL — ABNORMAL HIGH (ref 0.50–1.35)
Glucose, Bld: 83 mg/dL (ref 70–99)
POTASSIUM: 4.9 meq/L (ref 3.5–5.3)
SODIUM: 138 meq/L (ref 135–145)

## 2014-11-29 NOTE — Telephone Encounter (Signed)
Ruben Walker needed the pt's orders for his BMP released. He is in the lab now.   Thank you

## 2014-11-29 NOTE — Telephone Encounter (Signed)
bmp released

## 2014-12-02 ENCOUNTER — Other Ambulatory Visit: Payer: Self-pay | Admitting: Cardiology

## 2014-12-02 NOTE — Telephone Encounter (Signed)
Rx has been sent to the pharmacy electronically. ° °

## 2014-12-08 ENCOUNTER — Ambulatory Visit (INDEPENDENT_AMBULATORY_CARE_PROVIDER_SITE_OTHER): Payer: Medicare Other | Admitting: Pharmacist Clinician (PhC)/ Clinical Pharmacy Specialist

## 2014-12-08 DIAGNOSIS — I4891 Unspecified atrial fibrillation: Secondary | ICD-10-CM

## 2014-12-08 DIAGNOSIS — Z7901 Long term (current) use of anticoagulants: Secondary | ICD-10-CM

## 2014-12-08 DIAGNOSIS — I4821 Permanent atrial fibrillation: Secondary | ICD-10-CM

## 2014-12-08 DIAGNOSIS — I482 Chronic atrial fibrillation: Secondary | ICD-10-CM

## 2014-12-08 LAB — POCT INR: INR: 2.6

## 2014-12-22 ENCOUNTER — Other Ambulatory Visit: Payer: Self-pay | Admitting: Internal Medicine

## 2015-01-06 ENCOUNTER — Other Ambulatory Visit: Payer: Self-pay | Admitting: Cardiovascular Disease

## 2015-01-13 ENCOUNTER — Ambulatory Visit (INDEPENDENT_AMBULATORY_CARE_PROVIDER_SITE_OTHER): Payer: Medicare Other | Admitting: *Deleted

## 2015-01-13 DIAGNOSIS — I442 Atrioventricular block, complete: Secondary | ICD-10-CM | POA: Diagnosis not present

## 2015-01-13 DIAGNOSIS — I469 Cardiac arrest, cause unspecified: Secondary | ICD-10-CM

## 2015-01-13 LAB — CUP PACEART INCLINIC DEVICE CHECK
Battery Voltage: 2.83 V
Brady Statistic RA Percent Paced: 0 %
Brady Statistic RV Percent Paced: 99 %
Date Time Interrogation Session: 20160825040000
HighPow Impedance: 40 Ohm
HighPow Impedance: 46 Ohm
Lead Channel Impedance Value: 3000 Ohm
Lead Channel Pacing Threshold Amplitude: 0.4 V
Lead Channel Setting Pacing Amplitude: 2.4 V
Lead Channel Setting Pacing Pulse Width: 0.4 ms
MDC IDC MSMT LEADCHNL RV IMPEDANCE VALUE: 1071 Ohm
MDC IDC MSMT LEADCHNL RV PACING THRESHOLD PULSEWIDTH: 0.4 ms
MDC IDC PG SERIAL: 503349
MDC IDC SET ZONE DETECTION INTERVAL: 300 ms
Zone Setting Detection Interval: 250 ms
Zone Setting Detection Interval: 375 ms

## 2015-01-13 NOTE — Progress Notes (Signed)
ICD check in clinic. Normal device function. Threshold consistent with previous device measurements. Impedance trends stable over time. 1 NSVT episode- 13 seconds, pk V 171. Histogram distribution appropriate for patient and level of activity. No changes made this session. Device programmed at appropriate safety margins. Device programmed to optimize intrinsic conduction. Batt @ 2.84V (MOL1). Return to device clinic 04/21/15 and ROV with SK in Feb.

## 2015-01-18 ENCOUNTER — Other Ambulatory Visit: Payer: Self-pay | Admitting: Family Medicine

## 2015-01-18 NOTE — Telephone Encounter (Signed)
RF request for levocetirizine LOV: 11/04/14 Next ov: 05/04/14 Last written: 09/13/14 #30 w/ 3RF

## 2015-01-19 ENCOUNTER — Ambulatory Visit (INDEPENDENT_AMBULATORY_CARE_PROVIDER_SITE_OTHER): Payer: Medicare Other | Admitting: Pharmacist Clinician (PhC)/ Clinical Pharmacy Specialist

## 2015-01-19 DIAGNOSIS — I4891 Unspecified atrial fibrillation: Secondary | ICD-10-CM

## 2015-01-19 DIAGNOSIS — I482 Chronic atrial fibrillation: Secondary | ICD-10-CM

## 2015-01-19 DIAGNOSIS — I4821 Permanent atrial fibrillation: Secondary | ICD-10-CM

## 2015-01-19 DIAGNOSIS — Z7901 Long term (current) use of anticoagulants: Secondary | ICD-10-CM | POA: Diagnosis not present

## 2015-01-19 LAB — POCT INR: INR: 3.7

## 2015-02-02 ENCOUNTER — Ambulatory Visit (INDEPENDENT_AMBULATORY_CARE_PROVIDER_SITE_OTHER): Payer: Medicare Other | Admitting: Pharmacist Clinician (PhC)/ Clinical Pharmacy Specialist

## 2015-02-02 DIAGNOSIS — I4891 Unspecified atrial fibrillation: Secondary | ICD-10-CM

## 2015-02-02 DIAGNOSIS — I4821 Permanent atrial fibrillation: Secondary | ICD-10-CM

## 2015-02-02 DIAGNOSIS — Z7901 Long term (current) use of anticoagulants: Secondary | ICD-10-CM | POA: Diagnosis not present

## 2015-02-02 DIAGNOSIS — I482 Chronic atrial fibrillation: Secondary | ICD-10-CM

## 2015-02-02 LAB — POCT INR: INR: 2.3

## 2015-02-10 ENCOUNTER — Encounter: Payer: Self-pay | Admitting: Internal Medicine

## 2015-02-10 ENCOUNTER — Other Ambulatory Visit: Payer: Self-pay | Admitting: Internal Medicine

## 2015-02-11 ENCOUNTER — Ambulatory Visit (INDEPENDENT_AMBULATORY_CARE_PROVIDER_SITE_OTHER): Payer: Medicare Other | Admitting: Cardiology

## 2015-02-11 ENCOUNTER — Encounter: Payer: Self-pay | Admitting: Cardiology

## 2015-02-11 VITALS — BP 122/76 | HR 75 | Ht 65.0 in | Wt 208.4 lb

## 2015-02-11 DIAGNOSIS — I482 Chronic atrial fibrillation: Secondary | ICD-10-CM | POA: Diagnosis not present

## 2015-02-11 DIAGNOSIS — I10 Essential (primary) hypertension: Secondary | ICD-10-CM

## 2015-02-11 DIAGNOSIS — I4821 Permanent atrial fibrillation: Secondary | ICD-10-CM

## 2015-02-11 DIAGNOSIS — Z7901 Long term (current) use of anticoagulants: Secondary | ICD-10-CM

## 2015-02-11 DIAGNOSIS — E669 Obesity, unspecified: Secondary | ICD-10-CM

## 2015-02-11 DIAGNOSIS — N189 Chronic kidney disease, unspecified: Secondary | ICD-10-CM | POA: Diagnosis not present

## 2015-02-11 DIAGNOSIS — I469 Cardiac arrest, cause unspecified: Secondary | ICD-10-CM | POA: Diagnosis not present

## 2015-02-11 DIAGNOSIS — N183 Chronic kidney disease, stage 3 unspecified: Secondary | ICD-10-CM

## 2015-02-11 DIAGNOSIS — R600 Localized edema: Secondary | ICD-10-CM | POA: Insufficient documentation

## 2015-02-11 DIAGNOSIS — Z95810 Presence of automatic (implantable) cardiac defibrillator: Secondary | ICD-10-CM

## 2015-02-11 DIAGNOSIS — I442 Atrioventricular block, complete: Secondary | ICD-10-CM | POA: Diagnosis not present

## 2015-02-11 DIAGNOSIS — G4730 Sleep apnea, unspecified: Secondary | ICD-10-CM

## 2015-02-11 NOTE — Telephone Encounter (Signed)
This patient just saw Corine Shelter today and will follow up with Dr. Duke Salvia at [facility], whoever worked this patient up today needs to fix the way the RX reads in the chart- would clarify with the [place] office.  Thanks!

## 2015-02-11 NOTE — Telephone Encounter (Signed)
Could you please advise as to what current sig for this medication should be? Thanks, MI

## 2015-02-11 NOTE — Assessment & Plan Note (Signed)
In Feb 2000, ICD placed, minor CAD at cath then. Normal Myoview Jan 2014

## 2015-02-11 NOTE — Assessment & Plan Note (Signed)
Truncal obesity, BMI 35

## 2015-02-11 NOTE — Assessment & Plan Note (Signed)
Coumadin Rx- pt declines NOAC
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
## 2015-02-11 NOTE — Assessment & Plan Note (Addendum)
2001-device dependent

## 2015-02-11 NOTE — Assessment & Plan Note (Signed)
Controlled.  

## 2015-02-11 NOTE — Patient Instructions (Addendum)
If your weight increases 5 lbs in a week you can increase the Demadex to twice a day  and take an extra potassium each day until you return to your normal weight.  Your physician recommends that you schedule a follow-up appointment in: 3 months with Dr. Duke Salvia.

## 2015-02-11 NOTE — Assessment & Plan Note (Signed)
Chronic bilateral LE edema

## 2015-02-11 NOTE — Assessment & Plan Note (Signed)
Currently paced rhythm.

## 2015-02-11 NOTE — Assessment & Plan Note (Signed)
BS implanted 2000 with gen change 2004 and 2008

## 2015-02-11 NOTE — Assessment & Plan Note (Signed)
Pt declines C-pap

## 2015-02-11 NOTE — Assessment & Plan Note (Signed)
GFR 48 

## 2015-02-14 ENCOUNTER — Other Ambulatory Visit: Payer: Self-pay | Admitting: Internal Medicine

## 2015-02-23 ENCOUNTER — Ambulatory Visit (INDEPENDENT_AMBULATORY_CARE_PROVIDER_SITE_OTHER): Payer: Medicare Other | Admitting: Pharmacist Clinician (PhC)/ Clinical Pharmacy Specialist

## 2015-02-23 DIAGNOSIS — Z7901 Long term (current) use of anticoagulants: Secondary | ICD-10-CM

## 2015-02-23 DIAGNOSIS — I4891 Unspecified atrial fibrillation: Secondary | ICD-10-CM

## 2015-02-23 DIAGNOSIS — I4821 Permanent atrial fibrillation: Secondary | ICD-10-CM

## 2015-02-23 DIAGNOSIS — I482 Chronic atrial fibrillation: Secondary | ICD-10-CM | POA: Diagnosis not present

## 2015-02-23 LAB — POCT INR: INR: 3

## 2015-03-14 ENCOUNTER — Ambulatory Visit (INDEPENDENT_AMBULATORY_CARE_PROVIDER_SITE_OTHER): Payer: Medicare Other | Admitting: Pharmacist Clinician (PhC)/ Clinical Pharmacy Specialist

## 2015-03-14 DIAGNOSIS — Z7901 Long term (current) use of anticoagulants: Secondary | ICD-10-CM | POA: Diagnosis not present

## 2015-03-14 DIAGNOSIS — I482 Chronic atrial fibrillation: Secondary | ICD-10-CM

## 2015-03-14 DIAGNOSIS — I4891 Unspecified atrial fibrillation: Secondary | ICD-10-CM

## 2015-03-14 DIAGNOSIS — I4821 Permanent atrial fibrillation: Secondary | ICD-10-CM

## 2015-03-14 LAB — POCT INR: INR: 2.7

## 2015-03-21 ENCOUNTER — Other Ambulatory Visit: Payer: Self-pay | Admitting: *Deleted

## 2015-03-21 MED ORDER — TORSEMIDE 20 MG PO TABS: 20.0000 mg | ORAL_TABLET | Freq: Two times a day (BID) | ORAL | Status: DC

## 2015-03-23 ENCOUNTER — Ambulatory Visit: Payer: Medicare Other | Admitting: Pharmacist Clinician (PhC)/ Clinical Pharmacy Specialist

## 2015-04-09 ENCOUNTER — Other Ambulatory Visit: Payer: Self-pay | Admitting: Internal Medicine

## 2015-04-11 ENCOUNTER — Ambulatory Visit (INDEPENDENT_AMBULATORY_CARE_PROVIDER_SITE_OTHER): Payer: Medicare Other | Admitting: Pharmacist Clinician (PhC)/ Clinical Pharmacy Specialist

## 2015-04-11 DIAGNOSIS — I4821 Permanent atrial fibrillation: Secondary | ICD-10-CM

## 2015-04-11 DIAGNOSIS — I4891 Unspecified atrial fibrillation: Secondary | ICD-10-CM | POA: Diagnosis not present

## 2015-04-11 DIAGNOSIS — Z7901 Long term (current) use of anticoagulants: Secondary | ICD-10-CM

## 2015-04-11 DIAGNOSIS — I482 Chronic atrial fibrillation: Secondary | ICD-10-CM | POA: Diagnosis not present

## 2015-04-11 LAB — POCT INR: INR: 3.1

## 2015-04-21 ENCOUNTER — Encounter: Payer: Self-pay | Admitting: Internal Medicine

## 2015-04-21 ENCOUNTER — Ambulatory Visit (INDEPENDENT_AMBULATORY_CARE_PROVIDER_SITE_OTHER): Payer: Medicare Other | Admitting: *Deleted

## 2015-04-21 DIAGNOSIS — I469 Cardiac arrest, cause unspecified: Secondary | ICD-10-CM | POA: Diagnosis not present

## 2015-04-21 DIAGNOSIS — I442 Atrioventricular block, complete: Secondary | ICD-10-CM | POA: Diagnosis not present

## 2015-04-21 DIAGNOSIS — I482 Chronic atrial fibrillation: Secondary | ICD-10-CM

## 2015-04-21 DIAGNOSIS — I4821 Permanent atrial fibrillation: Secondary | ICD-10-CM

## 2015-04-21 LAB — CUP PACEART INCLINIC DEVICE CHECK
Brady Statistic RA Percent Paced: 0 %
Brady Statistic RV Percent Paced: 100 %
HIGH POWER IMPEDANCE MEASURED VALUE: 40 Ohm
HIGH POWER IMPEDANCE MEASURED VALUE: 42 Ohm
Implantable Lead Location: 753860
Implantable Lead Serial Number: 331438
Lead Channel Impedance Value: 3000 Ohm
Lead Channel Pacing Threshold Amplitude: 0.4 V
Lead Channel Pacing Threshold Amplitude: 0.4 V
Lead Channel Pacing Threshold Amplitude: 0.6 V
Lead Channel Pacing Threshold Pulse Width: 0.4 ms
Lead Channel Pacing Threshold Pulse Width: 0.4 ms
Lead Channel Pacing Threshold Pulse Width: 0.4 ms
Lead Channel Setting Pacing Amplitude: 2.4 V
Lead Channel Setting Pacing Pulse Width: 0.4 ms
MDC IDC LEAD IMPLANT DT: 20000223
MDC IDC LEAD MODEL: 144
MDC IDC MSMT BATTERY VOLTAGE: 2.8 V
MDC IDC MSMT LEADCHNL RV IMPEDANCE VALUE: 1050 Ohm
MDC IDC MSMT LEADCHNL RV PACING THRESHOLD AMPLITUDE: 0.4 V
MDC IDC MSMT LEADCHNL RV PACING THRESHOLD PULSEWIDTH: 0.4 ms
MDC IDC MSMT LEADCHNL RV SENSING INTR AMPL: 12.6 mV
MDC IDC PG SERIAL: 503349
MDC IDC SESS DTM: 20161201050000

## 2015-04-21 NOTE — Progress Notes (Signed)
ICD check in clinic. Normal device function. Threshold and sensing consistent with previous device measurements. Impedance trends stable over time. No evidence of any ventricular arrhythmias. Permanent AF+ warfarin. Histogram distribution appropriate for patient and level of activity. No changes made this session. Device programmed at appropriate safety margins. Device programmed to optimize intrinsic conduction. Battery status is MOL. Pt will follow up with SK on 2/21 @1415 . Patient education completed including shock plan.

## 2015-05-05 ENCOUNTER — Encounter: Payer: Self-pay | Admitting: Family Medicine

## 2015-05-05 ENCOUNTER — Ambulatory Visit (INDEPENDENT_AMBULATORY_CARE_PROVIDER_SITE_OTHER): Payer: Medicare Other | Admitting: Family Medicine

## 2015-05-05 VITALS — BP 106/68 | HR 86 | Temp 98.2°F | Resp 16 | Ht 65.0 in | Wt 206.0 lb

## 2015-05-05 DIAGNOSIS — N183 Chronic kidney disease, stage 3 unspecified: Secondary | ICD-10-CM

## 2015-05-05 DIAGNOSIS — E785 Hyperlipidemia, unspecified: Secondary | ICD-10-CM

## 2015-05-05 DIAGNOSIS — I5022 Chronic systolic (congestive) heart failure: Secondary | ICD-10-CM

## 2015-05-05 DIAGNOSIS — I10 Essential (primary) hypertension: Secondary | ICD-10-CM | POA: Diagnosis not present

## 2015-05-05 DIAGNOSIS — I872 Venous insufficiency (chronic) (peripheral): Secondary | ICD-10-CM

## 2015-05-05 DIAGNOSIS — D649 Anemia, unspecified: Secondary | ICD-10-CM

## 2015-05-05 DIAGNOSIS — J069 Acute upper respiratory infection, unspecified: Secondary | ICD-10-CM

## 2015-05-05 LAB — CBC WITH DIFFERENTIAL/PLATELET
BASOS ABS: 0 10*3/uL (ref 0.0–0.1)
Basophils Relative: 0.4 % (ref 0.0–3.0)
EOS PCT: 3.6 % (ref 0.0–5.0)
Eosinophils Absolute: 0.3 10*3/uL (ref 0.0–0.7)
HCT: 39.7 % (ref 39.0–52.0)
Hemoglobin: 12.8 g/dL — ABNORMAL LOW (ref 13.0–17.0)
LYMPHS ABS: 2 10*3/uL (ref 0.7–4.0)
Lymphocytes Relative: 22.9 % (ref 12.0–46.0)
MCHC: 32.3 g/dL (ref 30.0–36.0)
MCV: 93.7 fl (ref 78.0–100.0)
MONO ABS: 0.9 10*3/uL (ref 0.1–1.0)
MONOS PCT: 10.8 % (ref 3.0–12.0)
NEUTROS ABS: 5.5 10*3/uL (ref 1.4–7.7)
NEUTROS PCT: 62.3 % (ref 43.0–77.0)
PLATELETS: 230 10*3/uL (ref 150.0–400.0)
RBC: 4.24 Mil/uL (ref 4.22–5.81)
RDW: 14.4 % (ref 11.5–15.5)
WBC: 8.8 10*3/uL (ref 4.0–10.5)

## 2015-05-05 LAB — BASIC METABOLIC PANEL
BUN: 30 mg/dL — ABNORMAL HIGH (ref 6–23)
CALCIUM: 9.8 mg/dL (ref 8.4–10.5)
CO2: 31 mEq/L (ref 19–32)
CREATININE: 1.54 mg/dL — AB (ref 0.40–1.50)
Chloride: 98 mEq/L (ref 96–112)
GFR: 47.3 mL/min — ABNORMAL LOW (ref >60.00)
Glucose, Bld: 71 mg/dL (ref 70–99)
Potassium: 4.5 mEq/L (ref 3.5–5.1)
SODIUM: 136 meq/L (ref 135–145)

## 2015-05-05 NOTE — Progress Notes (Signed)
Pre visit review using our clinic review tool, if applicable. No additional management support is needed unless otherwise documented below in the visit note. 
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
## 2015-05-10 ENCOUNTER — Encounter: Payer: Self-pay | Admitting: Cardiovascular Disease

## 2015-05-10 ENCOUNTER — Ambulatory Visit (INDEPENDENT_AMBULATORY_CARE_PROVIDER_SITE_OTHER): Payer: Medicare Other | Admitting: *Deleted

## 2015-05-10 ENCOUNTER — Ambulatory Visit (INDEPENDENT_AMBULATORY_CARE_PROVIDER_SITE_OTHER): Payer: Medicare Other | Admitting: Cardiovascular Disease

## 2015-05-10 VITALS — BP 120/70 | HR 70 | Ht 65.0 in | Wt 206.3 lb

## 2015-05-10 DIAGNOSIS — Z7901 Long term (current) use of anticoagulants: Secondary | ICD-10-CM | POA: Diagnosis not present

## 2015-05-10 DIAGNOSIS — I5032 Chronic diastolic (congestive) heart failure: Secondary | ICD-10-CM | POA: Diagnosis not present

## 2015-05-10 DIAGNOSIS — K219 Gastro-esophageal reflux disease without esophagitis: Secondary | ICD-10-CM

## 2015-05-10 DIAGNOSIS — I4891 Unspecified atrial fibrillation: Secondary | ICD-10-CM

## 2015-05-10 DIAGNOSIS — I482 Chronic atrial fibrillation: Secondary | ICD-10-CM

## 2015-05-10 DIAGNOSIS — I10 Essential (primary) hypertension: Secondary | ICD-10-CM | POA: Diagnosis not present

## 2015-05-10 DIAGNOSIS — I4821 Permanent atrial fibrillation: Secondary | ICD-10-CM

## 2015-05-10 DIAGNOSIS — E785 Hyperlipidemia, unspecified: Secondary | ICD-10-CM

## 2015-05-10 LAB — POCT INR: INR: 2.7
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
## 2015-05-10 NOTE — Patient Instructions (Signed)
We will call you at verified number with updated instructions.

## 2015-05-10 NOTE — Patient Instructions (Signed)
NO CHANGE IN CURRENT MEDICATIONS    Your physician wants you to follow-up in 12 MONTHS DR [place].  You will receive a reminder letter in the mail two months in advance. If you don't receive a letter, please call our office to schedule the follow-up appointment.   If you need a refill on your cardiac medications before your next appointment, please call your pharmacy.

## 2015-05-12 ENCOUNTER — Other Ambulatory Visit: Payer: Self-pay | Admitting: Internal Medicine

## 2015-05-22 DIAGNOSIS — E662 Morbid (severe) obesity with alveolar hypoventilation: Secondary | ICD-10-CM

## 2015-05-22 HISTORY — DX: Morbid (severe) obesity with alveolar hypoventilation: E66.2

## 2015-06-13 ENCOUNTER — Other Ambulatory Visit: Payer: Self-pay | Admitting: Internal Medicine

## 2015-06-14 ENCOUNTER — Ambulatory Visit (INDEPENDENT_AMBULATORY_CARE_PROVIDER_SITE_OTHER): Payer: Medicare Other | Admitting: Pharmacist Clinician (PhC)/ Clinical Pharmacy Specialist

## 2015-06-14 DIAGNOSIS — I482 Chronic atrial fibrillation: Secondary | ICD-10-CM | POA: Diagnosis not present

## 2015-06-14 DIAGNOSIS — Z7901 Long term (current) use of anticoagulants: Secondary | ICD-10-CM

## 2015-06-14 DIAGNOSIS — I4821 Permanent atrial fibrillation: Secondary | ICD-10-CM

## 2015-06-14 LAB — POCT INR: INR: 2.4

## 2015-06-14 NOTE — Telephone Encounter (Signed)
Medication should be filled by PCP

## 2015-07-04 ENCOUNTER — Other Ambulatory Visit: Payer: Self-pay | Admitting: Internal Medicine

## 2015-07-08 ENCOUNTER — Telehealth: Payer: Self-pay | Admitting: Internal Medicine

## 2015-07-08 ENCOUNTER — Encounter: Payer: Self-pay | Admitting: *Deleted

## 2015-07-08 ENCOUNTER — Other Ambulatory Visit: Payer: Self-pay | Admitting: Internal Medicine

## 2015-07-08 MED ORDER — ALLOPURINOL 300 MG PO TABS: ORAL_TABLET | ORAL | Status: DC

## 2015-07-08 NOTE — Telephone Encounter (Signed)
Pt requesting refill for Allopurinol at [facility]

## 2015-07-11 ENCOUNTER — Encounter: Payer: Self-pay | Admitting: Internal Medicine

## 2015-07-11 ENCOUNTER — Ambulatory Visit (INDEPENDENT_AMBULATORY_CARE_PROVIDER_SITE_OTHER): Payer: Medicare Other | Admitting: Internal Medicine

## 2015-07-11 VITALS — BP 124/70 | HR 64 | Ht 65.0 in | Wt 212.0 lb

## 2015-07-11 DIAGNOSIS — Z95810 Presence of automatic (implantable) cardiac defibrillator: Secondary | ICD-10-CM

## 2015-07-11 DIAGNOSIS — I5022 Chronic systolic (congestive) heart failure: Secondary | ICD-10-CM | POA: Diagnosis not present

## 2015-07-11 DIAGNOSIS — I469 Cardiac arrest, cause unspecified: Secondary | ICD-10-CM | POA: Diagnosis not present

## 2015-07-11 DIAGNOSIS — I482 Chronic atrial fibrillation: Secondary | ICD-10-CM | POA: Diagnosis not present

## 2015-07-11 DIAGNOSIS — I4821 Permanent atrial fibrillation: Secondary | ICD-10-CM

## 2015-07-11 LAB — CUP PACEART INCLINIC DEVICE CHECK
HighPow Impedance: 42 Ohm
Implantable Lead Implant Date: 20000223
Lead Channel Impedance Value: 1116 Ohm
Lead Channel Sensing Intrinsic Amplitude: 14.8 mV
Lead Channel Setting Pacing Amplitude: 2.4 V
Lead Channel Setting Pacing Pulse Width: 0.4 ms
MDC IDC LEAD LOCATION: 753860
MDC IDC LEAD MODEL: 144
MDC IDC LEAD SERIAL: 331438
MDC IDC MSMT BATTERY VOLTAGE: 2.79 V
MDC IDC MSMT LEADCHNL RV PACING THRESHOLD AMPLITUDE: 0.4 V
MDC IDC MSMT LEADCHNL RV PACING THRESHOLD PULSEWIDTH: 0.4 ms
MDC IDC PG SERIAL: 503349
MDC IDC SESS DTM: 20170220113715
MDC IDC STAT BRADY RV PERCENT PACED: 99 %

## 2015-07-11 MED ORDER — CARVEDILOL 3.125 MG PO TABS: 3.1250 mg | ORAL_TABLET | Freq: Two times a day (BID) | ORAL | Status: DC
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
## 2015-07-11 NOTE — Patient Instructions (Signed)
Medication Instructions: 1) Stop metoprolol tartrate 2) Start coreg (carvedilol) 3.125 mg one tablet by mouth twice daily 3) Take potassium 10 meq two tablets (20 meq) by mouth every morning 4) Take torsemide 20 mg two tablets (40 mg) by mouth every morning  Labwork: - none  Procedures/Testing: - none  Follow-Up: - Your physician recommends that you schedule a follow-up appointment in: 3 months with the Device Clinic  - Your physician wants you to follow-up in: 1 year with Dr. Graciela Husbands. You will receive a reminder letter in the mail two months in advance. If you don't receive a letter, please call our office to schedule the follow-up appointment.  Any Additional Special Instructions Will Be Listed Below (If Applicable).     If you need a refill on your cardiac medications before your next appointment, please call your pharmacy.

## 2015-07-12 ENCOUNTER — Encounter: Payer: Medicare Other | Admitting: Internal Medicine

## 2015-07-25 ENCOUNTER — Ambulatory Visit (INDEPENDENT_AMBULATORY_CARE_PROVIDER_SITE_OTHER): Payer: Medicare Other | Admitting: Pharmacist Clinician (PhC)/ Clinical Pharmacy Specialist

## 2015-07-25 DIAGNOSIS — I482 Chronic atrial fibrillation: Secondary | ICD-10-CM | POA: Diagnosis not present

## 2015-07-25 DIAGNOSIS — I4821 Permanent atrial fibrillation: Secondary | ICD-10-CM

## 2015-07-25 DIAGNOSIS — I4891 Unspecified atrial fibrillation: Secondary | ICD-10-CM | POA: Diagnosis not present

## 2015-07-25 DIAGNOSIS — Z7901 Long term (current) use of anticoagulants: Secondary | ICD-10-CM

## 2015-07-25 LAB — POCT INR: INR: 3.1

## 2015-09-05 ENCOUNTER — Ambulatory Visit (INDEPENDENT_AMBULATORY_CARE_PROVIDER_SITE_OTHER): Payer: Medicare Other | Admitting: Pharmacist Clinician (PhC)/ Clinical Pharmacy Specialist

## 2015-09-05 DIAGNOSIS — I4821 Permanent atrial fibrillation: Secondary | ICD-10-CM

## 2015-09-05 DIAGNOSIS — Z7901 Long term (current) use of anticoagulants: Secondary | ICD-10-CM | POA: Diagnosis not present

## 2015-09-05 DIAGNOSIS — I482 Chronic atrial fibrillation: Secondary | ICD-10-CM | POA: Diagnosis not present

## 2015-09-05 DIAGNOSIS — I4891 Unspecified atrial fibrillation: Secondary | ICD-10-CM

## 2015-09-05 LAB — POCT INR: INR: 2.4

## 2015-09-20 ENCOUNTER — Other Ambulatory Visit: Payer: Self-pay | Admitting: Family Medicine

## 2015-10-10 ENCOUNTER — Ambulatory Visit (INDEPENDENT_AMBULATORY_CARE_PROVIDER_SITE_OTHER): Payer: Medicare Other | Admitting: *Deleted

## 2015-10-10 ENCOUNTER — Encounter: Payer: Self-pay | Admitting: Internal Medicine

## 2015-10-10 DIAGNOSIS — I4821 Permanent atrial fibrillation: Secondary | ICD-10-CM

## 2015-10-10 DIAGNOSIS — I482 Chronic atrial fibrillation: Secondary | ICD-10-CM | POA: Diagnosis not present

## 2015-10-10 DIAGNOSIS — Z7901 Long term (current) use of anticoagulants: Secondary | ICD-10-CM | POA: Diagnosis not present

## 2015-10-10 DIAGNOSIS — I4891 Unspecified atrial fibrillation: Secondary | ICD-10-CM | POA: Diagnosis not present

## 2015-10-10 DIAGNOSIS — I5032 Chronic diastolic (congestive) heart failure: Secondary | ICD-10-CM

## 2015-10-10 LAB — CUP PACEART INCLINIC DEVICE CHECK
Brady Statistic RA Percent Paced: 0 %
Date Time Interrogation Session: 20170522040000
HighPow Impedance: 40 Ohm
HighPow Impedance: 42 Ohm
Implantable Lead Implant Date: 20000223
Implantable Lead Location: 753860
Implantable Lead Model: 144
Lead Channel Impedance Value: 1093 Ohm
Lead Channel Pacing Threshold Pulse Width: 0.4 ms
MDC IDC LEAD SERIAL: 331438
MDC IDC MSMT BATTERY VOLTAGE: 2.73 V
MDC IDC MSMT LEADCHNL RA IMPEDANCE VALUE: 3000 Ohm — AB
MDC IDC MSMT LEADCHNL RA PACING THRESHOLD AMPLITUDE: 0.4 V
MDC IDC MSMT LEADCHNL RA PACING THRESHOLD PULSEWIDTH: 0.5 ms
MDC IDC MSMT LEADCHNL RV PACING THRESHOLD AMPLITUDE: 0.4 V
MDC IDC SET LEADCHNL RV PACING AMPLITUDE: 2.4 V
MDC IDC SET LEADCHNL RV PACING PULSEWIDTH: 0.4 ms
MDC IDC STAT BRADY RV PERCENT PACED: 100 %
Pulse Gen Serial Number: 503349

## 2015-10-10 LAB — POCT INR: INR: 3.1

## 2015-10-10 NOTE — Progress Notes (Signed)
ICD check in clinic. Normal device function. Threshold and sensing consistent with previous device measurements. Impedance trends stable over time. No evidence of any ventricular arrhythmias. Histogram distribution appropriate for patient and level of activity. No changes made this session. Device programmed at appropriate safety margins. Device programmed to optimize intrinsic conduction. Batt voltage 2.74V (ERI 2.6V). Pt will follow up with the Device clinic in 3 months and with SK in 06-2016. Patient education completed including shock plan.

## 2015-10-12 ENCOUNTER — Other Ambulatory Visit: Payer: Self-pay | Admitting: Internal Medicine

## 2015-10-12 NOTE — Telephone Encounter (Signed)
Coumadin refill. Thank you for your time. 

## 2015-11-03 ENCOUNTER — Encounter: Payer: Self-pay | Admitting: Family Medicine

## 2015-11-03 ENCOUNTER — Ambulatory Visit (INDEPENDENT_AMBULATORY_CARE_PROVIDER_SITE_OTHER): Payer: Medicare Other | Admitting: Family Medicine

## 2015-11-03 VITALS — BP 126/78 | HR 69 | Temp 97.4°F | Resp 16 | Ht 65.0 in | Wt 217.2 lb

## 2015-11-03 DIAGNOSIS — E785 Hyperlipidemia, unspecified: Secondary | ICD-10-CM

## 2015-11-03 DIAGNOSIS — N183 Chronic kidney disease, stage 3 unspecified: Secondary | ICD-10-CM

## 2015-11-03 DIAGNOSIS — I5022 Chronic systolic (congestive) heart failure: Secondary | ICD-10-CM | POA: Diagnosis not present

## 2015-11-03 DIAGNOSIS — I10 Essential (primary) hypertension: Secondary | ICD-10-CM

## 2015-11-03 DIAGNOSIS — I872 Venous insufficiency (chronic) (peripheral): Secondary | ICD-10-CM

## 2015-11-03 LAB — COMPREHENSIVE METABOLIC PANEL
ALBUMIN: 4.4 g/dL (ref 3.5–5.2)
ALT: 27 U/L (ref 0–53)
AST: 20 U/L (ref 0–37)
Alkaline Phosphatase: 88 U/L (ref 39–117)
BUN: 28 mg/dL — AB (ref 6–23)
CHLORIDE: 102 meq/L (ref 96–112)
CO2: 29 mEq/L (ref 19–32)
Calcium: 9.8 mg/dL (ref 8.4–10.5)
Creatinine, Ser: 1.51 mg/dL — ABNORMAL HIGH (ref 0.40–1.50)
GFR: 48.32 mL/min — ABNORMAL LOW (ref >60.00)
Glucose, Bld: 82 mg/dL (ref 70–99)
POTASSIUM: 4.6 meq/L (ref 3.5–5.1)
SODIUM: 138 meq/L (ref 135–145)
Total Bilirubin: 0.6 mg/dL (ref 0.2–1.2)
Total Protein: 6.3 g/dL (ref 6.0–8.3)

## 2015-11-03 NOTE — Progress Notes (Signed)
Pre visit review using our clinic review tool, if applicable. No additional management support is needed unless otherwise documented below in the visit note. 
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
## 2015-11-05 ENCOUNTER — Other Ambulatory Visit: Payer: Self-pay | Admitting: Cardiology

## 2015-11-07 ENCOUNTER — Encounter: Payer: Self-pay | Admitting: Family Medicine

## 2015-11-14 ENCOUNTER — Ambulatory Visit (INDEPENDENT_AMBULATORY_CARE_PROVIDER_SITE_OTHER): Payer: Medicare Other | Admitting: Pharmacist Clinician (PhC)/ Clinical Pharmacy Specialist

## 2015-11-14 DIAGNOSIS — I4821 Permanent atrial fibrillation: Secondary | ICD-10-CM

## 2015-11-14 DIAGNOSIS — Z7901 Long term (current) use of anticoagulants: Secondary | ICD-10-CM

## 2015-11-14 DIAGNOSIS — I482 Chronic atrial fibrillation: Secondary | ICD-10-CM

## 2015-11-14 DIAGNOSIS — I4891 Unspecified atrial fibrillation: Secondary | ICD-10-CM

## 2015-11-14 LAB — POCT INR: INR: 3

## 2015-11-23 ENCOUNTER — Encounter: Payer: Self-pay | Admitting: Family Medicine

## 2015-11-23 ENCOUNTER — Ambulatory Visit (HOSPITAL_COMMUNITY): Payer: Medicare Other | Attending: Cardiovascular Disease

## 2015-11-23 ENCOUNTER — Other Ambulatory Visit: Payer: Self-pay

## 2015-11-23 DIAGNOSIS — I5022 Chronic systolic (congestive) heart failure: Secondary | ICD-10-CM | POA: Diagnosis not present

## 2015-11-23 DIAGNOSIS — I110 Hypertensive heart disease with heart failure: Secondary | ICD-10-CM | POA: Diagnosis not present

## 2015-11-23 DIAGNOSIS — G4733 Obstructive sleep apnea (adult) (pediatric): Secondary | ICD-10-CM | POA: Diagnosis not present

## 2015-11-23 DIAGNOSIS — I351 Nonrheumatic aortic (valve) insufficiency: Secondary | ICD-10-CM | POA: Diagnosis not present

## 2015-11-23 DIAGNOSIS — I482 Chronic atrial fibrillation: Secondary | ICD-10-CM | POA: Diagnosis not present

## 2015-11-23 DIAGNOSIS — E785 Hyperlipidemia, unspecified: Secondary | ICD-10-CM | POA: Insufficient documentation

## 2015-11-23 DIAGNOSIS — I509 Heart failure, unspecified: Secondary | ICD-10-CM | POA: Diagnosis present

## 2015-11-23 DIAGNOSIS — I071 Rheumatic tricuspid insufficiency: Secondary | ICD-10-CM | POA: Insufficient documentation

## 2015-11-23 LAB — ECHOCARDIOGRAM COMPLETE
CHL CUP DOP CALC LVOT VTI: 15 cm
CHL CUP TV REG PEAK VELOCITY: 303 cm/s
EWDT: 155 ms
FS: 36 % (ref 28–44)
IV/PV OW: 1
LA ID, A-P, ES: 68 mm
LA diam end sys: 68 mm
LA vol: 194 mL
LADIAMINDEX: 3.32 cm/m2
LAVOLA4C: 177 mL
LAVOLIN: 94.6 mL/m2
LV PW d: 10.1 mm — AB (ref 0.6–1.1)
LVOT area: 3.8 cm2
LVOT diameter: 22 mm
LVOT peak grad rest: 2 mmHg
LVOT peak vel: 64.6 cm/s
LVOTSV: 57 mL
MV Dec: 155
MVPG: 3 mmHg
MVPKEVEL: 79.5 m/s
TRMAXVEL: 303 cm/s

## 2015-11-23 MED ORDER — PERFLUTREN LIPID MICROSPHERE
1.0000 mL | INTRAVENOUS | Status: AC | PRN
  Administered 2015-11-23: 2 mL via INTRAVENOUS

## 2015-11-24 ENCOUNTER — Encounter: Payer: Self-pay | Admitting: Family Medicine

## 2015-12-01 ENCOUNTER — Encounter: Payer: Self-pay | Admitting: Family Medicine

## 2015-12-01 ENCOUNTER — Ambulatory Visit (INDEPENDENT_AMBULATORY_CARE_PROVIDER_SITE_OTHER): Payer: Medicare Other | Admitting: Family Medicine

## 2015-12-01 VITALS — BP 133/75 | HR 70 | Temp 98.1°F | Resp 16 | Ht 65.0 in | Wt 216.2 lb

## 2015-12-01 DIAGNOSIS — E662 Morbid (severe) obesity with alveolar hypoventilation: Secondary | ICD-10-CM | POA: Diagnosis not present

## 2015-12-01 DIAGNOSIS — I5022 Chronic systolic (congestive) heart failure: Secondary | ICD-10-CM

## 2015-12-01 DIAGNOSIS — R0609 Other forms of dyspnea: Secondary | ICD-10-CM

## 2015-12-01 NOTE — Progress Notes (Signed)
Pre visit review using our clinic review tool, if applicable. No additional management support is needed unless otherwise documented below in the visit note. 
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
## 2015-12-02 ENCOUNTER — Telehealth: Payer: Self-pay | Admitting: Family Medicine

## 2015-12-02 NOTE — Telephone Encounter (Signed)
Remembered that he has his sleep study at [facility] on [address] a few years ago if that helps you get his results.

## 2015-12-02 NOTE — Telephone Encounter (Signed)
Spoke with Toma Copier at [facility], she stated that Dr. Jamey Reas only mentioned that pt had an abnormal sleep study in the past in his last office note in February but there was no record of where or when.

## 2015-12-02 NOTE — Telephone Encounter (Signed)
Noted  

## 2015-12-09 ENCOUNTER — Ambulatory Visit (HOSPITAL_COMMUNITY)
Admission: RE | Admit: 2015-12-09 | Discharge: 2015-12-09 | Disposition: A | Discharge status: Home / Self Care | Payer: Medicare Other | Source: Ambulatory Visit | Attending: Family Medicine | Admitting: Family Medicine

## 2015-12-09 DIAGNOSIS — R0609 Other forms of dyspnea: Secondary | ICD-10-CM | POA: Insufficient documentation

## 2015-12-09 DIAGNOSIS — E662 Morbid (severe) obesity with alveolar hypoventilation: Secondary | ICD-10-CM | POA: Insufficient documentation

## 2015-12-09 LAB — PULMONARY FUNCTION TEST
DL/VA % pred: 73 %
DL/VA: 3.11 ml/min/mmHg/L
DLCO UNC % PRED: 55 %
DLCO unc: 14.17 ml/min/mmHg
FEF 25-75 PRE: 1.94 L/s
FEF 25-75 Post: 1.83 L/sec
FEF2575-%Change-Post: -5 %
FEF2575-%PRED-PRE: 105 %
FEF2575-%Pred-Post: 99 %
FEV1-%Change-Post: 0 %
FEV1-%PRED-POST: 82 %
FEV1-%Pred-Pre: 81 %
FEV1-POST: 2.06 L
FEV1-Pre: 2.04 L
FEV1FVC-%Change-Post: -1 %
FEV1FVC-%Pred-Pre: 106 %
FEV6-%CHANGE-POST: 2 %
FEV6-%PRED-POST: 82 %
FEV6-%PRED-PRE: 80 %
FEV6-PRE: 2.62 L
FEV6-Post: 2.68 L
FEV6FVC-%PRED-PRE: 107 %
FEV6FVC-%Pred-Post: 107 %
FVC-%Change-Post: 2 %
FVC-%Pred-Post: 77 %
FVC-%Pred-Pre: 75 %
FVC-Post: 2.68 L
FVC-Pre: 2.62 L
POST FEV6/FVC RATIO: 100 %
PRE FEV6/FVC RATIO: 100 %
Post FEV1/FVC ratio: 77 %
Pre FEV1/FVC ratio: 78 %
RV % PRED: 112 %
RV: 2.52 L
TLC % PRED: 89 %
TLC: 5.37 L

## 2015-12-09 MED ORDER — ALBUTEROL SULFATE (2.5 MG/3ML) 0.083% IN NEBU
2.5000 mg | INHALATION_SOLUTION | Freq: Once | RESPIRATORY_TRACT | Status: AC
  Administered 2015-12-09: 2.5 mg via RESPIRATORY_TRACT

## 2015-12-26 ENCOUNTER — Ambulatory Visit (INDEPENDENT_AMBULATORY_CARE_PROVIDER_SITE_OTHER): Payer: Medicare Other | Admitting: Pharmacist Clinician (PhC)/ Clinical Pharmacy Specialist

## 2015-12-26 DIAGNOSIS — Z7901 Long term (current) use of anticoagulants: Secondary | ICD-10-CM

## 2015-12-26 DIAGNOSIS — I482 Chronic atrial fibrillation: Secondary | ICD-10-CM

## 2015-12-26 DIAGNOSIS — I4891 Unspecified atrial fibrillation: Secondary | ICD-10-CM | POA: Diagnosis not present

## 2015-12-26 DIAGNOSIS — I4821 Permanent atrial fibrillation: Secondary | ICD-10-CM

## 2015-12-26 LAB — POCT INR: INR: 3.1

## 2016-01-09 ENCOUNTER — Encounter: Payer: Self-pay | Admitting: Internal Medicine

## 2016-01-09 ENCOUNTER — Ambulatory Visit (INDEPENDENT_AMBULATORY_CARE_PROVIDER_SITE_OTHER): Payer: Medicare Other | Admitting: *Deleted

## 2016-01-09 DIAGNOSIS — I469 Cardiac arrest, cause unspecified: Secondary | ICD-10-CM | POA: Diagnosis not present

## 2016-01-09 DIAGNOSIS — Z95810 Presence of automatic (implantable) cardiac defibrillator: Secondary | ICD-10-CM | POA: Diagnosis not present

## 2016-01-09 DIAGNOSIS — Z7901 Long term (current) use of anticoagulants: Secondary | ICD-10-CM | POA: Diagnosis not present

## 2016-01-09 DIAGNOSIS — I4821 Permanent atrial fibrillation: Secondary | ICD-10-CM

## 2016-01-09 DIAGNOSIS — I482 Chronic atrial fibrillation: Secondary | ICD-10-CM | POA: Diagnosis not present

## 2016-01-09 DIAGNOSIS — I4891 Unspecified atrial fibrillation: Secondary | ICD-10-CM

## 2016-01-09 LAB — CUP PACEART INCLINIC DEVICE CHECK
Brady Statistic RA Percent Paced: 0 %
HighPow Impedance: 40 Ohm
HighPow Impedance: 41 Ohm
Implantable Lead Location: 753860
Implantable Lead Model: 144
Lead Channel Impedance Value: 1050 Ohm
Lead Channel Pacing Threshold Amplitude: 0.4 V
Lead Channel Pacing Threshold Pulse Width: 0.4 ms
Lead Channel Setting Pacing Amplitude: 2.4 V
Lead Channel Setting Pacing Pulse Width: 0.4 ms
MDC IDC LEAD IMPLANT DT: 20000223
MDC IDC LEAD SERIAL: 331438
MDC IDC MSMT BATTERY VOLTAGE: 2.66 V
MDC IDC MSMT LEADCHNL RA IMPEDANCE VALUE: 3000 Ohm — AB
MDC IDC SESS DTM: 20170821040000
MDC IDC STAT BRADY RV PERCENT PACED: 100 %
Pulse Gen Serial Number: 503349

## 2016-01-09 LAB — POCT INR: INR: 2.8

## 2016-01-09 NOTE — Progress Notes (Signed)
ICD check in clinic. Normal device function. Thresholds and sensing consistent with previous device measurements. Impedance trends stable over time. No evidence of any ventricular arrhythmias. Histogram distribution appropriate for patient and level of activity. No changes made this session. Device programmed at appropriate safety margins. Device programmed to optimize intrinsic conduction. Battery voltage 2.66V, ERI at 2.60V; anticipate ERI within next few months per tech services--will begin monthly battery checks. Patient education completed including shock plan. Alert tones demonstrated for patient, but patient unable to hear. ROV with device clinic on 02/13/16 for battery check (N/C) and with SK in 06/2016.

## 2016-01-20 ENCOUNTER — Other Ambulatory Visit: Payer: Self-pay

## 2016-01-26 ENCOUNTER — Ambulatory Visit (INDEPENDENT_AMBULATORY_CARE_PROVIDER_SITE_OTHER): Payer: Medicare Other | Admitting: Pharmacist

## 2016-01-26 DIAGNOSIS — I482 Chronic atrial fibrillation: Secondary | ICD-10-CM

## 2016-01-26 DIAGNOSIS — Z7901 Long term (current) use of anticoagulants: Secondary | ICD-10-CM | POA: Diagnosis not present

## 2016-01-26 DIAGNOSIS — I4821 Permanent atrial fibrillation: Secondary | ICD-10-CM

## 2016-01-26 DIAGNOSIS — I4891 Unspecified atrial fibrillation: Secondary | ICD-10-CM

## 2016-01-26 LAB — POCT INR: INR: 2.5

## 2016-02-13 ENCOUNTER — Ambulatory Visit (INDEPENDENT_AMBULATORY_CARE_PROVIDER_SITE_OTHER): Payer: Medicare Other | Admitting: *Deleted

## 2016-02-13 DIAGNOSIS — Z7901 Long term (current) use of anticoagulants: Secondary | ICD-10-CM

## 2016-02-13 DIAGNOSIS — I482 Chronic atrial fibrillation: Secondary | ICD-10-CM

## 2016-02-13 DIAGNOSIS — I4891 Unspecified atrial fibrillation: Secondary | ICD-10-CM

## 2016-02-13 DIAGNOSIS — I4821 Permanent atrial fibrillation: Secondary | ICD-10-CM

## 2016-02-13 DIAGNOSIS — Z95810 Presence of automatic (implantable) cardiac defibrillator: Secondary | ICD-10-CM

## 2016-02-13 LAB — POCT INR: INR: 2.9

## 2016-02-13 NOTE — Progress Notes (Signed)
Battery Check Only: Battery estimate at <0.37mo with Battery voltage of 2.63V. ROV with DC 10/26.

## 2016-03-01 ENCOUNTER — Ambulatory Visit (INDEPENDENT_AMBULATORY_CARE_PROVIDER_SITE_OTHER): Payer: Medicare Other | Admitting: Family Medicine

## 2016-03-01 ENCOUNTER — Encounter: Payer: Self-pay | Admitting: Family Medicine

## 2016-03-01 VITALS — BP 124/80 | HR 70 | Temp 97.8°F | Resp 16 | Wt 213.8 lb

## 2016-03-01 DIAGNOSIS — N183 Chronic kidney disease, stage 3 unspecified: Secondary | ICD-10-CM

## 2016-03-01 DIAGNOSIS — E662 Morbid (severe) obesity with alveolar hypoventilation: Secondary | ICD-10-CM

## 2016-03-01 DIAGNOSIS — R0609 Other forms of dyspnea: Secondary | ICD-10-CM | POA: Diagnosis not present

## 2016-03-01 LAB — CBC WITH DIFFERENTIAL/PLATELET
BASOS ABS: 0 10*3/uL (ref 0.0–0.1)
Basophils Relative: 0.3 % (ref 0.0–3.0)
EOS ABS: 0.3 10*3/uL (ref 0.0–0.7)
Eosinophils Relative: 4 % (ref 0.0–5.0)
HEMATOCRIT: 36.4 % — AB (ref 39.0–52.0)
Hemoglobin: 12 g/dL — ABNORMAL LOW (ref 13.0–17.0)
LYMPHS ABS: 1.8 10*3/uL (ref 0.7–4.0)
LYMPHS PCT: 26 % (ref 12.0–46.0)
MCHC: 33.1 g/dL (ref 30.0–36.0)
MCV: 93.4 fl (ref 78.0–100.0)
MONOS PCT: 15.3 % — AB (ref 3.0–12.0)
Monocytes Absolute: 1 10*3/uL (ref 0.1–1.0)
NEUTROS ABS: 3.7 10*3/uL (ref 1.4–7.7)
NEUTROS PCT: 54.4 % (ref 43.0–77.0)
PLATELETS: 182 10*3/uL (ref 150.0–400.0)
RBC: 3.89 Mil/uL — AB (ref 4.22–5.81)
RDW: 14.7 % (ref 11.5–15.5)
WBC: 6.8 10*3/uL (ref 4.0–10.5)

## 2016-03-01 LAB — BASIC METABOLIC PANEL
BUN: 38 mg/dL — ABNORMAL HIGH (ref 6–23)
CHLORIDE: 99 meq/L (ref 96–112)
CO2: 30 meq/L (ref 19–32)
Calcium: 9.8 mg/dL (ref 8.4–10.5)
Creatinine, Ser: 1.6 mg/dL — ABNORMAL HIGH (ref 0.40–1.50)
GFR: 45.15 mL/min — ABNORMAL LOW (ref >60.00)
Glucose, Bld: 70 mg/dL (ref 70–99)
POTASSIUM: 4.5 meq/L (ref 3.5–5.1)
Sodium: 137 mEq/L (ref 135–145)
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
## 2016-03-01 NOTE — Progress Notes (Signed)
Pre visit review using our clinic review tool, if applicable. No additional management support is needed unless otherwise documented below in the visit note. 

## 2016-03-14 ENCOUNTER — Ambulatory Visit (INDEPENDENT_AMBULATORY_CARE_PROVIDER_SITE_OTHER): Payer: Medicare Other | Admitting: *Deleted

## 2016-03-14 DIAGNOSIS — I482 Chronic atrial fibrillation: Secondary | ICD-10-CM

## 2016-03-14 DIAGNOSIS — Z7901 Long term (current) use of anticoagulants: Secondary | ICD-10-CM

## 2016-03-14 DIAGNOSIS — I4821 Permanent atrial fibrillation: Secondary | ICD-10-CM

## 2016-03-14 DIAGNOSIS — I4891 Unspecified atrial fibrillation: Secondary | ICD-10-CM | POA: Diagnosis not present

## 2016-03-14 DIAGNOSIS — Z95810 Presence of automatic (implantable) cardiac defibrillator: Secondary | ICD-10-CM

## 2016-03-14 LAB — POCT INR: INR: 2.8

## 2016-03-15 LAB — CUP PACEART INCLINIC DEVICE CHECK
Battery Voltage: 2.61 V
Brady Statistic RA Percent Paced: 0 %
Date Time Interrogation Session: 20171025040000
HighPow Impedance: 40 Ohm
HighPow Impedance: 41 Ohm
Implantable Lead Implant Date: 20000223
Implantable Lead Model: 144
Implantable Lead Serial Number: 331438
Lead Channel Impedance Value: 1050 Ohm
Lead Channel Pacing Threshold Amplitude: 0.2 V — CL
Lead Channel Pacing Threshold Amplitude: 0.4 V
Lead Channel Pacing Threshold Pulse Width: 0.4 ms
Lead Channel Pacing Threshold Pulse Width: 0.4 ms
MDC IDC LEAD LOCATION: 753860
MDC IDC MSMT LEADCHNL RA IMPEDANCE VALUE: 3000 Ohm — AB
MDC IDC MSMT LEADCHNL RA PACING THRESHOLD AMPLITUDE: 0.4 V
MDC IDC MSMT LEADCHNL RA PACING THRESHOLD PULSEWIDTH: 0.5 ms
MDC IDC MSMT LEADCHNL RV PACING THRESHOLD AMPLITUDE: 0.2 V — AB
MDC IDC MSMT LEADCHNL RV PACING THRESHOLD AMPLITUDE: 0.4 V
MDC IDC MSMT LEADCHNL RV PACING THRESHOLD PULSEWIDTH: 0.4 ms
MDC IDC MSMT LEADCHNL RV PACING THRESHOLD PULSEWIDTH: 0.4 ms
MDC IDC PG SERIAL: 503349
MDC IDC SET LEADCHNL RV PACING AMPLITUDE: 2.4 V
MDC IDC SET LEADCHNL RV PACING PULSEWIDTH: 0.4 ms
MDC IDC STAT BRADY RV PERCENT PACED: 100 %

## 2016-03-15 NOTE — Progress Notes (Signed)
Battery Check only--Battery Voltage 2.61. ROV with DC in 3mo for battery re-check.

## 2016-03-28 ENCOUNTER — Other Ambulatory Visit: Payer: Self-pay | Admitting: Family Medicine

## 2016-04-06 ENCOUNTER — Other Ambulatory Visit: Payer: Self-pay | Admitting: Internal Medicine

## 2016-04-06 NOTE — Telephone Encounter (Signed)
Will need to be filled by the PCP.  Thanks!

## 2016-04-06 NOTE — Telephone Encounter (Signed)
Okay to refill? 

## 2016-04-18 ENCOUNTER — Ambulatory Visit (INDEPENDENT_AMBULATORY_CARE_PROVIDER_SITE_OTHER): Payer: Medicare Other | Admitting: *Deleted

## 2016-04-18 DIAGNOSIS — I482 Chronic atrial fibrillation: Secondary | ICD-10-CM

## 2016-04-18 DIAGNOSIS — Z95810 Presence of automatic (implantable) cardiac defibrillator: Secondary | ICD-10-CM

## 2016-04-18 DIAGNOSIS — I4891 Unspecified atrial fibrillation: Secondary | ICD-10-CM | POA: Diagnosis not present

## 2016-04-18 DIAGNOSIS — Z7901 Long term (current) use of anticoagulants: Secondary | ICD-10-CM

## 2016-04-18 DIAGNOSIS — I4821 Permanent atrial fibrillation: Secondary | ICD-10-CM

## 2016-04-18 LAB — POCT INR: INR: 2.6

## 2016-04-18 NOTE — Patient Instructions (Signed)
Battery check, Battery at [facility] since 03/25/2016. ROV with SK 05/04/2016 to discuss gen change.

## 2016-04-27 ENCOUNTER — Encounter: Payer: Self-pay | Admitting: Internal Medicine

## 2016-05-02 ENCOUNTER — Ambulatory Visit (INDEPENDENT_AMBULATORY_CARE_PROVIDER_SITE_OTHER): Payer: Medicare Other | Admitting: Cardiovascular Disease

## 2016-05-02 ENCOUNTER — Encounter: Payer: Self-pay | Admitting: Cardiovascular Disease

## 2016-05-02 VITALS — BP 104/60 | HR 73 | Ht 62.0 in | Wt 208.4 lb

## 2016-05-02 DIAGNOSIS — I10 Essential (primary) hypertension: Secondary | ICD-10-CM | POA: Diagnosis not present

## 2016-05-02 DIAGNOSIS — I5022 Chronic systolic (congestive) heart failure: Secondary | ICD-10-CM

## 2016-05-02 DIAGNOSIS — I5032 Chronic diastolic (congestive) heart failure: Secondary | ICD-10-CM | POA: Diagnosis not present

## 2016-05-02 DIAGNOSIS — I482 Chronic atrial fibrillation: Secondary | ICD-10-CM | POA: Diagnosis not present

## 2016-05-02 DIAGNOSIS — I4821 Permanent atrial fibrillation: Secondary | ICD-10-CM

## 2016-05-02 DIAGNOSIS — E7800 Pure hypercholesterolemia, unspecified: Secondary | ICD-10-CM
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
## 2016-05-02 NOTE — Patient Instructions (Addendum)
Medication Instructions:  Your physician recommends that you continue on your current medications as directed. Please refer to the Current Medication list given to you today.  Labwork: none  Testing/Procedures: none  Follow-Up: Your physician wants you to follow-up in: 6 month ov You will receive a reminder letter in the mail two months in advance. If you don't receive a letter, please call our office to schedule the follow-up appointment.  Any Other Special Instructions Will Be Listed Below (If Applicable). If you have increased swelling it is ok to increase your Torsemide to 40 mg twice a day as needed If you do not notice any change with increase or are needing to do this frequently call the office at [phone]  If you need a refill on your cardiac medications before your next appointment, please call your pharmacy.

## 2016-05-03 ENCOUNTER — Encounter: Payer: Self-pay | Admitting: Internal Medicine

## 2016-05-03 ENCOUNTER — Ambulatory Visit (INDEPENDENT_AMBULATORY_CARE_PROVIDER_SITE_OTHER): Payer: Medicare Other | Admitting: Internal Medicine

## 2016-05-03 ENCOUNTER — Encounter (INDEPENDENT_AMBULATORY_CARE_PROVIDER_SITE_OTHER): Payer: Self-pay

## 2016-05-03 VITALS — BP 118/56 | HR 73 | Ht 62.0 in | Wt 208.4 lb

## 2016-05-03 DIAGNOSIS — I482 Chronic atrial fibrillation: Secondary | ICD-10-CM | POA: Diagnosis not present

## 2016-05-03 DIAGNOSIS — I4821 Permanent atrial fibrillation: Secondary | ICD-10-CM

## 2016-05-03 DIAGNOSIS — Z95810 Presence of automatic (implantable) cardiac defibrillator: Secondary | ICD-10-CM

## 2016-05-03 DIAGNOSIS — I5022 Chronic systolic (congestive) heart failure: Secondary | ICD-10-CM | POA: Diagnosis not present

## 2016-05-03 DIAGNOSIS — I469 Cardiac arrest, cause unspecified: Secondary | ICD-10-CM | POA: Diagnosis not present

## 2016-05-03 NOTE — Patient Instructions (Signed)
Medication Instructions:    Your physician recommends that you continue on your current medications as directed. Please refer to the Current Medication list given to you today.  --- If you need a refill on your cardiac medications before your next appointment, please call your pharmacy. ---  Labwork:  None ordered  Testing/Procedures: Your physician has recommended that you have a defibrillator generator change (battery change). The office will call you to arrange this procedure.  Follow-Up:  To be determined once procedure has been scheduled   Thank you for choosing [facility]!!    Any Other Special Instructions Will Be Listed Below (If Applicable). We will work on finding a Child psychotherapist, in your area, to address transportation issue and other needs.
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
## 2016-05-08 ENCOUNTER — Other Ambulatory Visit: Payer: Self-pay | Admitting: Cardiovascular Disease

## 2016-05-08 ENCOUNTER — Other Ambulatory Visit: Payer: Self-pay | Admitting: Internal Medicine

## 2016-05-08 NOTE — Telephone Encounter (Signed)
Review for refill. 

## 2016-05-17 ENCOUNTER — Telehealth: Payer: Self-pay | Admitting: Internal Medicine

## 2016-05-17 ENCOUNTER — Encounter: Payer: Self-pay | Admitting: *Deleted

## 2016-05-17 NOTE — Telephone Encounter (Signed)
Scheduled ICD generator change for 06/04/16. Pt will have pre procedure labs 05/30/16 along w/ coumadin check. Last dose of Coumadin to be taken 05/31/16. Letter of instructions reviewed with pt and left at front desk for him to pick up on the 10th. Wound check scheduled for 06/14/16. Patient verbalized understanding and agreeable to plan.   I will call and speak with case mngr at hospital about if able to get a cab voucher for pt to have a ride home.  Pt understands I will call him next week after speaking to someone.

## 2016-05-17 NOTE — Telephone Encounter (Signed)
New message ° ° ° ° °Returning a call to the nurse °

## 2016-05-24 ENCOUNTER — Other Ambulatory Visit: Payer: Self-pay | Admitting: Internal Medicine
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
## 2016-05-30 ENCOUNTER — Other Ambulatory Visit: Payer: Medicare Other | Admitting: *Deleted

## 2016-05-30 ENCOUNTER — Telehealth: Payer: Self-pay

## 2016-05-30 ENCOUNTER — Ambulatory Visit (INDEPENDENT_AMBULATORY_CARE_PROVIDER_SITE_OTHER): Payer: Medicare Other | Admitting: *Deleted

## 2016-05-30 DIAGNOSIS — Z7901 Long term (current) use of anticoagulants: Secondary | ICD-10-CM | POA: Diagnosis not present

## 2016-05-30 DIAGNOSIS — E7800 Pure hypercholesterolemia, unspecified: Secondary | ICD-10-CM

## 2016-05-30 DIAGNOSIS — I442 Atrioventricular block, complete: Secondary | ICD-10-CM

## 2016-05-30 DIAGNOSIS — I482 Chronic atrial fibrillation: Secondary | ICD-10-CM

## 2016-05-30 DIAGNOSIS — I4891 Unspecified atrial fibrillation: Secondary | ICD-10-CM | POA: Diagnosis not present

## 2016-05-30 DIAGNOSIS — I10 Essential (primary) hypertension: Secondary | ICD-10-CM | POA: Diagnosis not present

## 2016-05-30 DIAGNOSIS — I4821 Permanent atrial fibrillation: Secondary | ICD-10-CM

## 2016-05-30 LAB — POCT INR: INR: 3.3

## 2016-05-30 NOTE — Addendum Note (Signed)
Addended by: Tonita Phoenix on: 05/30/2016 11:24 AM   Modules accepted: Orders

## 2016-05-30 NOTE — Telephone Encounter (Signed)
Patient is here with coumadin clinic. Coumadin clinic called and stated patient needed labs today because he is having a device exchange. BMET and CBC ordered to be completed today.

## 2016-05-31 LAB — BASIC METABOLIC PANEL
BUN/Creatinine Ratio: 23 (ref 10–24)
BUN: 34 mg/dL — AB (ref 8–27)
CALCIUM: 9.9 mg/dL (ref 8.6–10.2)
CO2: 24 mmol/L (ref 18–29)
CREATININE: 1.47 mg/dL — AB (ref 0.76–1.27)
Chloride: 97 mmol/L (ref 96–106)
GFR calc Af Amer: 54 mL/min/{1.73_m2} — ABNORMAL LOW (ref >59)
GFR, EST NON AFRICAN AMERICAN: 46 mL/min/{1.73_m2} — AB (ref >59)
Glucose: 80 mg/dL (ref 65–99)
POTASSIUM: 4.9 mmol/L (ref 3.5–5.2)
Sodium: 141 mmol/L (ref 134–144)

## 2016-05-31 LAB — CBC WITH DIFFERENTIAL/PLATELET
BASOS: 0 %
Basophils Absolute: 0 10*3/uL (ref 0.0–0.2)
EOS (ABSOLUTE): 0.3 10*3/uL (ref 0.0–0.4)
Eos: 5 %
HEMOGLOBIN: 12.3 g/dL — AB (ref 13.0–17.7)
Hematocrit: 35.3 % — ABNORMAL LOW (ref 37.5–51.0)
IMMATURE GRANS (ABS): 0 10*3/uL (ref 0.0–0.1)
IMMATURE GRANULOCYTES: 0 %
LYMPHS: 28 %
Lymphocytes Absolute: 2.1 10*3/uL (ref 0.7–3.1)
MCH: 31.1 pg (ref 26.6–33.0)
MCHC: 34.8 g/dL (ref 31.5–35.7)
MCV: 89 fL (ref 79–97)
MONOCYTES: 14 %
Monocytes Absolute: 1.1 10*3/uL — ABNORMAL HIGH (ref 0.1–0.9)
NEUTROS PCT: 53 %
Neutrophils Absolute: 3.8 10*3/uL (ref 1.4–7.0)
PLATELETS: 205 10*3/uL (ref 150–379)
RBC: 3.96 x10E6/uL — ABNORMAL LOW (ref 4.14–5.80)
RDW: 14.4 % (ref 12.3–15.4)
WBC: 7.3 10*3/uL (ref 3.4–10.8)

## 2016-06-04 ENCOUNTER — Telehealth: Payer: Self-pay | Admitting: Internal Medicine

## 2016-06-04 ENCOUNTER — Ambulatory Visit (HOSPITAL_COMMUNITY)
Admission: RE | Admit: 2016-06-04 | Discharge: 2016-06-04 | Disposition: A | Discharge status: Home / Self Care | Payer: Medicare Other | Source: Ambulatory Visit | Attending: Internal Medicine | Admitting: Internal Medicine

## 2016-06-04 ENCOUNTER — Encounter (HOSPITAL_COMMUNITY)
Admission: RE | Disposition: A | Discharge status: Home / Self Care | Payer: Self-pay | Source: Ambulatory Visit | Attending: Internal Medicine

## 2016-06-04 DIAGNOSIS — Z4502 Encounter for adjustment and management of automatic implantable cardiac defibrillator: Secondary | ICD-10-CM | POA: Diagnosis not present

## 2016-06-04 DIAGNOSIS — I872 Venous insufficiency (chronic) (peripheral): Secondary | ICD-10-CM | POA: Diagnosis not present

## 2016-06-04 DIAGNOSIS — I5022 Chronic systolic (congestive) heart failure: Secondary | ICD-10-CM | POA: Diagnosis not present

## 2016-06-04 DIAGNOSIS — Z8674 Personal history of sudden cardiac arrest: Secondary | ICD-10-CM | POA: Diagnosis not present

## 2016-06-04 DIAGNOSIS — D631 Anemia in chronic kidney disease: Secondary | ICD-10-CM | POA: Insufficient documentation

## 2016-06-04 DIAGNOSIS — K219 Gastro-esophageal reflux disease without esophagitis: Secondary | ICD-10-CM | POA: Diagnosis not present

## 2016-06-04 DIAGNOSIS — I469 Cardiac arrest, cause unspecified: Secondary | ICD-10-CM

## 2016-06-04 DIAGNOSIS — Z7901 Long term (current) use of anticoagulants: Secondary | ICD-10-CM | POA: Insufficient documentation

## 2016-06-04 DIAGNOSIS — I130 Hypertensive heart and chronic kidney disease with heart failure and stage 1 through stage 4 chronic kidney disease, or unspecified chronic kidney disease: Secondary | ICD-10-CM | POA: Insufficient documentation

## 2016-06-04 DIAGNOSIS — I428 Other cardiomyopathies: Secondary | ICD-10-CM | POA: Insufficient documentation

## 2016-06-04 DIAGNOSIS — I442 Atrioventricular block, complete: Secondary | ICD-10-CM | POA: Diagnosis not present

## 2016-06-04 DIAGNOSIS — Z7951 Long term (current) use of inhaled steroids: Secondary | ICD-10-CM | POA: Diagnosis not present

## 2016-06-04 DIAGNOSIS — E662 Morbid (severe) obesity with alveolar hypoventilation: Secondary | ICD-10-CM | POA: Diagnosis not present

## 2016-06-04 DIAGNOSIS — E785 Hyperlipidemia, unspecified: Secondary | ICD-10-CM | POA: Diagnosis not present

## 2016-06-04 DIAGNOSIS — Z6839 Body mass index (BMI) 39.0-39.9, adult: Secondary | ICD-10-CM | POA: Diagnosis not present

## 2016-06-04 DIAGNOSIS — I482 Chronic atrial fibrillation: Secondary | ICD-10-CM | POA: Diagnosis not present

## 2016-06-04 DIAGNOSIS — N183 Chronic kidney disease, stage 3 (moderate): Secondary | ICD-10-CM | POA: Diagnosis not present

## 2016-06-04 DIAGNOSIS — Z95810 Presence of automatic (implantable) cardiac defibrillator: Secondary | ICD-10-CM | POA: Diagnosis present

## 2016-06-04 HISTORY — PX: EP IMPLANTABLE DEVICE: SHX172B

## 2016-06-04 LAB — PROTIME-INR
INR: 1.25
PROTHROMBIN TIME: 15.8 s — AB (ref 11.4–15.2)

## 2016-06-04 LAB — SURGICAL PCR SCREEN
MRSA, PCR: NEGATIVE
STAPHYLOCOCCUS AUREUS: POSITIVE — AB

## 2016-06-04 SURGERY — ICD GENERATOR CHANGEOUT

## 2016-06-04 MED ORDER — LIDOCAINE HCL (PF) 1 % IJ SOLN
INTRAMUSCULAR | Status: DC | PRN
  Administered 2016-06-04: 45 mL

## 2016-06-04 MED ORDER — SODIUM CHLORIDE 0.9% FLUSH: 3.0000 mL | INTRAVENOUS | Status: DC | PRN

## 2016-06-04 MED ORDER — SODIUM CHLORIDE 0.9 % IR SOLN
80.0000 mg | Status: AC
  Administered 2016-06-04: 80 mg

## 2016-06-04 MED ORDER — SODIUM CHLORIDE 0.9 % IV SOLN: INTRAVENOUS | Status: DC

## 2016-06-04 MED ORDER — SODIUM CHLORIDE 0.9 % IV SOLN: 250.0000 mL | INTRAVENOUS | Status: DC

## 2016-06-04 MED ORDER — ACETAMINOPHEN 325 MG PO TABS
325.0000 mg | ORAL_TABLET | ORAL | Status: DC | PRN
Start: 2016-06-04 — End: 2016-06-04
  Filled 2016-06-04: qty 2

## 2016-06-04 MED ORDER — LIDOCAINE HCL (PF) 1 % IJ SOLN
INTRAMUSCULAR | Status: AC
  Filled 2016-06-04: qty 60

## 2016-06-04 MED ORDER — CEFAZOLIN SODIUM-DEXTROSE 2-4 GM/100ML-% IV SOLN
INTRAVENOUS | Status: AC
  Filled 2016-06-04: qty 100

## 2016-06-04 MED ORDER — CEFAZOLIN SODIUM-DEXTROSE 2-4 GM/100ML-% IV SOLN
2.0000 g | INTRAVENOUS | Status: AC
  Administered 2016-06-04: 2 g via INTRAVENOUS

## 2016-06-04 MED ORDER — ONDANSETRON HCL 4 MG/2ML IJ SOLN: 4.0000 mg | Freq: Four times a day (QID) | INTRAMUSCULAR | Status: DC | PRN

## 2016-06-04 MED ORDER — SODIUM CHLORIDE 0.9 % IR SOLN
Status: AC
  Filled 2016-06-04: qty 2

## 2016-06-04 MED ORDER — MUPIROCIN 2 % EX OINT
TOPICAL_OINTMENT | CUTANEOUS | Status: AC
  Filled 2016-06-04: qty 22

## 2016-06-04 MED ORDER — SODIUM CHLORIDE 0.9% FLUSH: 3.0000 mL | Freq: Two times a day (BID) | INTRAVENOUS | Status: DC

## 2016-06-04 MED ORDER — MUPIROCIN 2 % EX OINT
TOPICAL_OINTMENT | Freq: Two times a day (BID) | CUTANEOUS | Status: DC
  Administered 2016-06-04: 11:00:00 via NASAL

## 2016-06-04 MED ORDER — SODIUM CHLORIDE 0.9 % IV SOLN
INTRAVENOUS | Status: DC
  Administered 2016-06-04: 11:00:00 via INTRAVENOUS

## 2016-06-04 MED ORDER — CHLORHEXIDINE GLUCONATE 4 % EX LIQD: 60.0000 mL | Freq: Once | CUTANEOUS | Status: DC

## 2016-06-04 SURGICAL SUPPLY — 9 items
CABLE SURGICAL S-101-97-12 (CABLE) ×3 IMPLANT
DEFIB ICD DYNAGEN EL VR SNGL (ICD Generator) ×3 IMPLANT
DEFIBRILATOR ICD DYNGN VR SNGL (ICD Generator) ×1 IMPLANT
DEVICE DISSECT PLASMABLAD 3.0S (MISCELLANEOUS) ×1 IMPLANT
HEMOSTAT SURGICEL 2X4 FIBR (HEMOSTASIS) ×3 IMPLANT
PAD DEFIB LIFELINK (PAD) ×3 IMPLANT
PLASMABLADE 3.0S (MISCELLANEOUS) ×3
POUCH AIGIS-R ANTIBACT ICD (Mesh General) ×3 IMPLANT
TRAY PACEMAKER INSERTION (PACKS) ×3 IMPLANT

## 2016-06-04 NOTE — Discharge Instructions (Signed)
Pacemaker Battery Change, Care After Refer to this sheet in the next few weeks. These instructions provide you with information on caring for yourself after your procedure. Your health care provider may also give you more specific instructions. Your treatment has been planned according to current medical practices, but problems sometimes occur. Call your health care provider if you have any problems or questions after your procedure. WHAT TO EXPECT AFTER THE PROCEDURE After your procedure, it is typical to have the following sensations:  Soreness at the pacemaker site. HOME CARE INSTRUCTIONS   Keep the incision clean and dry UNTIL TOMORROW EVENING AND THEN MAY SHOWER; DERMABOND WILL COME OFF IN 10-14 DAYS AND IF NOT THEY WILL REMOVE IT WHEN YOU COME IN FOR A WOUND CHECK   Unless advised otherwise, you may shower beginning 48 hours after your procedure.  For the first week after the replacement, avoid stretching motions that pull at the incision site, and avoid heavy exercise with the arm that is on the same side as the incision.  Take medicines only as directed by your health care provider.  Keep all follow-up visits as directed by your health care provider. SEEK MEDICAL CARE IF:   You have pain at the incision site that is not relieved by over-the-counter or prescription medicine.  There is drainage or pus from the incision site.  There is swelling larger than a lime at the incision site.  You develop red streaking that extends above or below the incision site.  You feel brief, intermittent palpitations, light-headedness, or any symptoms that you feel might be related to your heart. SEEK IMMEDIATE MEDICAL CARE IF:   You experience chest pain that is different than the pain at the pacemaker site.  You experience shortness of breath.  You have palpitations or irregular heartbeat.  You have light-headedness that does not go away quickly.  You faint.  You have pain that gets  worse and is not relieved by medicine. This information is not intended to replace advice given to you by your health care provider. Make sure you discuss any questions you have with your health care provider. Document Released: 02/25/2013 Document Revised: 05/28/2014 Document Reviewed: 02/25/2013 Elsevier Interactive Patient Education  2017 [employer].
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
## 2016-06-04 NOTE — Telephone Encounter (Signed)
Error

## 2016-06-05 ENCOUNTER — Encounter (HOSPITAL_COMMUNITY): Payer: Self-pay | Admitting: Internal Medicine

## 2016-06-05 ENCOUNTER — Other Ambulatory Visit: Payer: Self-pay | Admitting: Internal Medicine

## 2016-06-07 ENCOUNTER — Other Ambulatory Visit: Payer: Self-pay | Admitting: Internal Medicine

## 2016-06-14 ENCOUNTER — Ambulatory Visit (INDEPENDENT_AMBULATORY_CARE_PROVIDER_SITE_OTHER): Payer: Medicare Other | Admitting: *Deleted

## 2016-06-14 ENCOUNTER — Ambulatory Visit (INDEPENDENT_AMBULATORY_CARE_PROVIDER_SITE_OTHER): Payer: Medicare Other | Admitting: Pharmacist

## 2016-06-14 DIAGNOSIS — I469 Cardiac arrest, cause unspecified: Secondary | ICD-10-CM

## 2016-06-14 DIAGNOSIS — Z7901 Long term (current) use of anticoagulants: Secondary | ICD-10-CM

## 2016-06-14 DIAGNOSIS — I4891 Unspecified atrial fibrillation: Secondary | ICD-10-CM

## 2016-06-14 DIAGNOSIS — I482 Chronic atrial fibrillation: Secondary | ICD-10-CM

## 2016-06-14 DIAGNOSIS — I4821 Permanent atrial fibrillation: Secondary | ICD-10-CM

## 2016-06-14 LAB — POCT INR: INR: 2

## 2016-06-14 MED ORDER — ALLOPURINOL 300 MG PO TABS: 300.0000 mg | ORAL_TABLET | Freq: Every day | ORAL | 1 refills | Status: DC

## 2016-06-14 NOTE — Progress Notes (Signed)
Wound check appointment. Dermabond removed. Wound without redness or edema. Incision edges approximated, soft hematoma with healing stages of bruising noted. Normal device function. Threshold, sensing, and impedances consistent with implant measurements. Device programmed at chronic outputs Histogram distribution appropriate for patient and level of activity. No ventricular arrhythmias noted. Patient educated about wound care, arm mobility, and, shock plan. Pt voiced understanding to call device clinic if incision site became hard or the hematoma increased in size. ROV in 07/24/2016 w/ SK

## 2016-07-04 ENCOUNTER — Other Ambulatory Visit: Payer: Self-pay | Admitting: Internal Medicine

## 2016-07-05 ENCOUNTER — Ambulatory Visit (INDEPENDENT_AMBULATORY_CARE_PROVIDER_SITE_OTHER): Payer: Medicare Other | Admitting: Pharmacist Clinician (PhC)/ Clinical Pharmacy Specialist

## 2016-07-05 DIAGNOSIS — I482 Chronic atrial fibrillation: Secondary | ICD-10-CM | POA: Diagnosis not present

## 2016-07-05 DIAGNOSIS — I4821 Permanent atrial fibrillation: Secondary | ICD-10-CM

## 2016-07-05 DIAGNOSIS — Z7901 Long term (current) use of anticoagulants: Secondary | ICD-10-CM

## 2016-07-05 DIAGNOSIS — I4891 Unspecified atrial fibrillation: Secondary | ICD-10-CM

## 2016-07-05 LAB — POCT INR: INR: 4.5

## 2016-07-19 DIAGNOSIS — J9601 Acute respiratory failure with hypoxia: Secondary | ICD-10-CM

## 2016-07-19 HISTORY — DX: Acute respiratory failure with hypoxia: J96.01

## 2016-07-23 ENCOUNTER — Telehealth: Payer: Self-pay | Admitting: Cardiovascular Disease

## 2016-07-23 NOTE — Telephone Encounter (Signed)
Returned call. Pt of Dr. Duke Salvia, notes "I've always had swelling", was instructed in past that he may take a 3rd pill of torsemide if needed for swelling. Notes he has been doing this for about 1 week, after observance of continued leg and foot swelling which did not resolve w his usual dose of 2x20mg  tablets daily. Pt notes the swelling increase is gradual, no SOB (except when bending over), he feels the swelling has improved a little since initiation of extra doses, but still having some swelling.  He has visit w Dr. Graciela Husbands scheduled on 3/9 (f/u for new defib placed on 1/15) - notes Dr. Graciela Husbands usually assess his LEs at visit.  Informed pt I would inform Dr. Duke Salvia, see if any action advised prior to upcoming OV. Advised OK to continue 3rd pill daily for now. Pt voiced understanding.

## 2016-07-23 NOTE — Telephone Encounter (Signed)
New message    Pt c/o swelling: STAT is pt has developed SOB within 24 hours  1. How long have you been experiencing swelling? One week  2. Where is the swelling located? Both legs   3.  Are you currently taking a "fluid pill"? Yes, pt states he has been taking 3 pills a day instead of two.  4.  Are you currently SOB? A little bit when he bends over and raises back up.  5.  Have you traveled recently? No.

## 2016-07-24 ENCOUNTER — Encounter: Payer: Medicare Other | Admitting: Internal Medicine

## 2016-07-25 NOTE — Telephone Encounter (Signed)
Recommend increasing torsemide to 40 mg bid until he sees Dr. Graciela Husbands.  Follow up with me or APP within 1 week

## 2016-07-26 NOTE — Telephone Encounter (Signed)
Returned call to patient and made aware of recommendations.    Appt scheduled with Dr. Duke Salvia for 11AM on 3/16 at [facility].  Patient aware and verbalized understanding.    Patient sees Dr. Graciela Husbands tomorrow at 1:45.

## 2016-07-27 ENCOUNTER — Encounter: Payer: Self-pay | Admitting: Internal Medicine

## 2016-07-27 ENCOUNTER — Ambulatory Visit (INDEPENDENT_AMBULATORY_CARE_PROVIDER_SITE_OTHER): Payer: Medicare Other | Admitting: Internal Medicine

## 2016-07-27 ENCOUNTER — Ambulatory Visit (INDEPENDENT_AMBULATORY_CARE_PROVIDER_SITE_OTHER): Payer: Medicare Other | Admitting: *Deleted

## 2016-07-27 VITALS — BP 100/74 | HR 69 | Ht 62.0 in | Wt 214.2 lb

## 2016-07-27 DIAGNOSIS — Z7901 Long term (current) use of anticoagulants: Secondary | ICD-10-CM | POA: Diagnosis not present

## 2016-07-27 DIAGNOSIS — I4821 Permanent atrial fibrillation: Secondary | ICD-10-CM

## 2016-07-27 DIAGNOSIS — I482 Chronic atrial fibrillation: Secondary | ICD-10-CM | POA: Diagnosis not present

## 2016-07-27 DIAGNOSIS — I5022 Chronic systolic (congestive) heart failure: Secondary | ICD-10-CM | POA: Diagnosis not present

## 2016-07-27 DIAGNOSIS — Z95810 Presence of automatic (implantable) cardiac defibrillator: Secondary | ICD-10-CM

## 2016-07-27 DIAGNOSIS — I4891 Unspecified atrial fibrillation: Secondary | ICD-10-CM

## 2016-07-27 LAB — CUP PACEART INCLINIC DEVICE CHECK
HighPow Impedance: 44 Ohm
Implantable Lead Implant Date: 20000223
Implantable Lead Location: 753860
Implantable Lead Serial Number: 331438
Implantable Pulse Generator Implant Date: 20180115
Lead Channel Pacing Threshold Amplitude: 0.4 V
Lead Channel Pacing Threshold Pulse Width: 0.4 ms
MDC IDC MSMT LEADCHNL RV IMPEDANCE VALUE: 1015 Ohm
MDC IDC PG SERIAL: 195683
MDC IDC SESS DTM: 20180309050000
MDC IDC SET LEADCHNL RV PACING AMPLITUDE: 2.5 V
MDC IDC SET LEADCHNL RV PACING PULSEWIDTH: 0.4 ms
MDC IDC SET LEADCHNL RV SENSING SENSITIVITY: 0.5 mV

## 2016-07-27 LAB — POCT INR: INR: 3.1

## 2016-07-27 MED ORDER — TORSEMIDE 20 MG PO TABS
ORAL_TABLET | ORAL | 3 refills | Status: DC
Start: 2016-07-27 — End: 2016-08-19

## 2016-07-27 MED ORDER — METOLAZONE 2.5 MG PO TABS: ORAL_TABLET | ORAL | 0 refills | Status: DC

## 2016-07-27 NOTE — Patient Instructions (Addendum)
Medication Instructions: - Your physician has recommended you make the following change in your medication: 1) Decrease torsemide 20 mg- take two tablets (40 mg) by mouth ONCE daily 2) Start zaroxalyn (metolazone) 2.5 mg- take one tablet by mouth twice a week 30 minutes prior to your torsemide dose  Labwork: - none ordered  Procedures/Testing: - none ordered  Follow-Up: - Remote monitoring is used to monitor your Pacemaker of ICD from home. This monitoring reduces the number of office visits required to check your device to one time per year. It allows [place] to keep an eye on the functioning of your device to ensure it is working properly. You are scheduled for a device check from home on 10/29/16. You may send your transmission at any time that day. If you have a wireless device, the transmission will be sent automatically. After your physician reviews your transmission, you will receive a postcard with your next transmission date.  - Your physician wants you to follow-up in: 1 year with Dr. Graciela Husbands. You will receive a reminder letter in the mail two months in advance. If you don't receive a letter, please call our office to schedule the follow-up appointment.  Any Additional Special Instructions Will Be Listed Below (If Applicable).     If you need a refill on your cardiac medications before your next appointment, please call your pharmacy.
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
## 2016-08-03 ENCOUNTER — Encounter: Payer: Self-pay | Admitting: Cardiovascular Disease

## 2016-08-03 ENCOUNTER — Ambulatory Visit (INDEPENDENT_AMBULATORY_CARE_PROVIDER_SITE_OTHER): Payer: Medicare Other | Admitting: Pharmacist

## 2016-08-03 ENCOUNTER — Ambulatory Visit (INDEPENDENT_AMBULATORY_CARE_PROVIDER_SITE_OTHER): Payer: Medicare Other | Admitting: Cardiovascular Disease

## 2016-08-03 VITALS — BP 113/63 | HR 71 | Ht 62.0 in | Wt 212.0 lb

## 2016-08-03 DIAGNOSIS — R601 Generalized edema: Secondary | ICD-10-CM | POA: Diagnosis not present

## 2016-08-03 DIAGNOSIS — Z7901 Long term (current) use of anticoagulants: Secondary | ICD-10-CM | POA: Diagnosis not present

## 2016-08-03 DIAGNOSIS — Z95810 Presence of automatic (implantable) cardiac defibrillator: Secondary | ICD-10-CM

## 2016-08-03 DIAGNOSIS — E7800 Pure hypercholesterolemia, unspecified: Secondary | ICD-10-CM

## 2016-08-03 DIAGNOSIS — I5033 Acute on chronic diastolic (congestive) heart failure: Secondary | ICD-10-CM | POA: Diagnosis not present

## 2016-08-03 DIAGNOSIS — R0600 Dyspnea, unspecified: Secondary | ICD-10-CM

## 2016-08-03 DIAGNOSIS — I482 Chronic atrial fibrillation: Secondary | ICD-10-CM

## 2016-08-03 DIAGNOSIS — I4821 Permanent atrial fibrillation: Secondary | ICD-10-CM

## 2016-08-03 DIAGNOSIS — I4891 Unspecified atrial fibrillation: Secondary | ICD-10-CM

## 2016-08-03 LAB — BASIC METABOLIC PANEL WITH GFR
BUN: 48 mg/dL — ABNORMAL HIGH (ref 7–25)
CO2: 27 mmol/L (ref 20–31)
Calcium: 10.4 mg/dL — ABNORMAL HIGH (ref 8.6–10.3)
Chloride: 93 mmol/L — ABNORMAL LOW (ref 98–110)
Creat: 1.82 mg/dL — ABNORMAL HIGH (ref 0.70–1.18)
Glucose, Bld: 77 mg/dL (ref 65–99)
Potassium: 4 mmol/L (ref 3.5–5.3)
Sodium: 135 mmol/L (ref 135–146)

## 2016-08-03 LAB — POCT INR: INR: 2.2
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
## 2016-08-03 NOTE — Patient Instructions (Signed)
Medication Instructions: no change   Labwork: BNP, BMET at [facility] labs. You do not need to fast for this test.   Testing/Procedures: none    Follow-Up: in 3 months with Dr. Duke Salvia   If you need a refill on your cardiac medications before your next appointment, please call your pharmacy.

## 2016-08-05 LAB — BRAIN NATRIURETIC PEPTIDE: Brain Natriuretic Peptide: 125.6 pg/mL — ABNORMAL HIGH (ref <100)

## 2016-08-08 ENCOUNTER — Telehealth: Payer: Self-pay

## 2016-08-08 DIAGNOSIS — N183 Chronic kidney disease, stage 3 unspecified: Secondary | ICD-10-CM

## 2016-08-08 DIAGNOSIS — Z79899 Other long term (current) drug therapy: Secondary | ICD-10-CM

## 2016-08-08 NOTE — Telephone Encounter (Signed)
Pt notified, his weight on his scale today is 210 we can use this as his baseline, he will monitor his weight and take metolazone twice per week as needed for weight gain of 2 lb in one day or 5 lb in one week. Refill sent to pharmacy and lab orders entered.

## 2016-08-08 NOTE — Telephone Encounter (Signed)
-----   Message from Chilton Si, MD sent at 08/07/2016  3:46 PM EDT ----- Kidney function is a little worse.  Take metolazone twice per week as needed for weight gain of 2 lb in one day or 5 lb in one week.  Also if he notices increased shortness of breath or edema.  Repeat BMP in 1 week.

## 2016-08-13 ENCOUNTER — Inpatient Hospital Stay (HOSPITAL_COMMUNITY)
Admission: EM | Admit: 2016-08-13 | Discharge: 2016-08-18 | DRG: 291 | Disposition: A | Discharge status: Home Health | Payer: Medicare Other | Attending: Family Medicine | Admitting: Family Medicine

## 2016-08-13 ENCOUNTER — Emergency Department (HOSPITAL_COMMUNITY): Payer: Medicare Other

## 2016-08-13 ENCOUNTER — Encounter (HOSPITAL_COMMUNITY): Payer: Self-pay | Admitting: *Deleted

## 2016-08-13 DIAGNOSIS — I5031 Acute diastolic (congestive) heart failure: Secondary | ICD-10-CM

## 2016-08-13 DIAGNOSIS — N183 Chronic kidney disease, stage 3 (moderate): Secondary | ICD-10-CM | POA: Diagnosis not present

## 2016-08-13 DIAGNOSIS — Z95810 Presence of automatic (implantable) cardiac defibrillator: Secondary | ICD-10-CM | POA: Diagnosis not present

## 2016-08-13 DIAGNOSIS — I4821 Permanent atrial fibrillation: Secondary | ICD-10-CM | POA: Diagnosis present

## 2016-08-13 DIAGNOSIS — I130 Hypertensive heart and chronic kidney disease with heart failure and stage 1 through stage 4 chronic kidney disease, or unspecified chronic kidney disease: Principal | ICD-10-CM | POA: Diagnosis present

## 2016-08-13 DIAGNOSIS — I482 Chronic atrial fibrillation: Secondary | ICD-10-CM | POA: Diagnosis present

## 2016-08-13 DIAGNOSIS — I5023 Acute on chronic systolic (congestive) heart failure: Secondary | ICD-10-CM | POA: Diagnosis present

## 2016-08-13 DIAGNOSIS — L89610 Pressure ulcer of right heel, unstageable: Secondary | ICD-10-CM | POA: Insufficient documentation

## 2016-08-13 DIAGNOSIS — I5032 Chronic diastolic (congestive) heart failure: Secondary | ICD-10-CM | POA: Diagnosis present

## 2016-08-13 DIAGNOSIS — Z79899 Other long term (current) drug therapy: Secondary | ICD-10-CM

## 2016-08-13 DIAGNOSIS — E785 Hyperlipidemia, unspecified: Secondary | ICD-10-CM | POA: Diagnosis present

## 2016-08-13 DIAGNOSIS — J9600 Acute respiratory failure, unspecified whether with hypoxia or hypercapnia: Secondary | ICD-10-CM | POA: Diagnosis not present

## 2016-08-13 DIAGNOSIS — Z7901 Long term (current) use of anticoagulants: Secondary | ICD-10-CM

## 2016-08-13 DIAGNOSIS — I442 Atrioventricular block, complete: Secondary | ICD-10-CM | POA: Diagnosis present

## 2016-08-13 DIAGNOSIS — G4733 Obstructive sleep apnea (adult) (pediatric): Secondary | ICD-10-CM | POA: Diagnosis present

## 2016-08-13 DIAGNOSIS — I10 Essential (primary) hypertension: Secondary | ICD-10-CM | POA: Diagnosis not present

## 2016-08-13 DIAGNOSIS — K219 Gastro-esophageal reflux disease without esophagitis: Secondary | ICD-10-CM | POA: Diagnosis present

## 2016-08-13 DIAGNOSIS — J189 Pneumonia, unspecified organism: Secondary | ICD-10-CM | POA: Diagnosis not present

## 2016-08-13 DIAGNOSIS — I5033 Acute on chronic diastolic (congestive) heart failure: Secondary | ICD-10-CM | POA: Diagnosis present

## 2016-08-13 DIAGNOSIS — R0602 Shortness of breath: Secondary | ICD-10-CM | POA: Diagnosis not present

## 2016-08-13 DIAGNOSIS — N179 Acute kidney failure, unspecified: Secondary | ICD-10-CM | POA: Diagnosis not present

## 2016-08-13 DIAGNOSIS — I509 Heart failure, unspecified: Secondary | ICD-10-CM | POA: Diagnosis not present

## 2016-08-13 DIAGNOSIS — Z6837 Body mass index (BMI) 37.0-37.9, adult: Secondary | ICD-10-CM | POA: Diagnosis not present

## 2016-08-13 DIAGNOSIS — I248 Other forms of acute ischemic heart disease: Secondary | ICD-10-CM | POA: Diagnosis present

## 2016-08-13 DIAGNOSIS — Z8674 Personal history of sudden cardiac arrest: Secondary | ICD-10-CM | POA: Diagnosis not present

## 2016-08-13 DIAGNOSIS — D649 Anemia, unspecified: Secondary | ICD-10-CM | POA: Diagnosis present

## 2016-08-13 DIAGNOSIS — L8990 Pressure ulcer of unspecified site, unspecified stage: Secondary | ICD-10-CM | POA: Insufficient documentation

## 2016-08-13 DIAGNOSIS — Z808 Family history of malignant neoplasm of other organs or systems: Secondary | ICD-10-CM | POA: Diagnosis not present

## 2016-08-13 DIAGNOSIS — J9601 Acute respiratory failure with hypoxia: Secondary | ICD-10-CM | POA: Diagnosis present

## 2016-08-13 DIAGNOSIS — Z8249 Family history of ischemic heart disease and other diseases of the circulatory system: Secondary | ICD-10-CM

## 2016-08-13 DIAGNOSIS — E669 Obesity, unspecified: Secondary | ICD-10-CM | POA: Diagnosis present

## 2016-08-13 DIAGNOSIS — L8991 Pressure ulcer of unspecified site, stage 1: Secondary | ICD-10-CM | POA: Diagnosis present

## 2016-08-13 DIAGNOSIS — E876 Hypokalemia: Secondary | ICD-10-CM | POA: Diagnosis not present

## 2016-08-13 DIAGNOSIS — I110 Hypertensive heart disease with heart failure: Secondary | ICD-10-CM | POA: Diagnosis not present

## 2016-08-13 HISTORY — DX: Acute respiratory failure with hypoxia: J96.01

## 2016-08-13 LAB — CG4 I-STAT (LACTIC ACID): LACTIC ACID, VENOUS: 1.6 mmol/L (ref 0.5–1.9)

## 2016-08-13 LAB — TROPONIN I
Troponin I: 0.06 ng/mL (ref <0.03)
Troponin I: 0.06 ng/mL (ref <0.03)

## 2016-08-13 LAB — CBC WITH DIFFERENTIAL/PLATELET
Basophils Absolute: 0 10*3/uL (ref 0.0–0.1)
Basophils Relative: 0 %
EOS ABS: 0 10*3/uL (ref 0.0–0.7)
EOS PCT: 0 %
HCT: 33.6 % — ABNORMAL LOW (ref 39.0–52.0)
Hemoglobin: 10.9 g/dL — ABNORMAL LOW (ref 13.0–17.0)
LYMPHS ABS: 1.2 10*3/uL (ref 0.7–4.0)
Lymphocytes Relative: 14 %
MCH: 30.2 pg (ref 26.0–34.0)
MCHC: 32.4 g/dL (ref 30.0–36.0)
MCV: 93.1 fL (ref 78.0–100.0)
MONO ABS: 0.8 10*3/uL (ref 0.1–1.0)
Monocytes Relative: 10 %
Neutro Abs: 6.2 10*3/uL (ref 1.7–7.7)
Neutrophils Relative %: 76 %
PLATELETS: 132 10*3/uL — AB (ref 150–400)
RBC: 3.61 MIL/uL — ABNORMAL LOW (ref 4.22–5.81)
RDW: 14.2 % (ref 11.5–15.5)
WBC: 8.2 10*3/uL (ref 4.0–10.5)

## 2016-08-13 LAB — BASIC METABOLIC PANEL
Anion gap: 11 (ref 5–15)
BUN: 39 mg/dL — AB (ref 6–20)
CO2: 27 mmol/L (ref 22–32)
CREATININE: 1.8 mg/dL — AB (ref 0.61–1.24)
Calcium: 9.1 mg/dL (ref 8.9–10.3)
Chloride: 96 mmol/L — ABNORMAL LOW (ref 101–111)
GFR calc Af Amer: 41 mL/min — ABNORMAL LOW (ref >60)
GFR calc non Af Amer: 35 mL/min — ABNORMAL LOW (ref >60)
Glucose, Bld: 110 mg/dL — ABNORMAL HIGH (ref 65–99)
Potassium: 4.4 mmol/L (ref 3.5–5.1)
SODIUM: 134 mmol/L — AB (ref 135–145)

## 2016-08-13 LAB — URINALYSIS, MICROSCOPIC (REFLEX): WBC, UA: NONE SEEN WBC/hpf (ref 0–5)

## 2016-08-13 LAB — BLOOD GAS, ARTERIAL
Acid-Base Excess: 0.1 mmol/L (ref 0.0–2.0)
BICARBONATE: 23.9 mmol/L (ref 20.0–28.0)
DELIVERY SYSTEMS: POSITIVE
DRAWN BY: 275531
EXPIRATORY PAP: 6
FIO2: 0.3
Inspiratory PAP: 10
LHR: 10 {breaths}/min
O2 Saturation: 98 %
PCO2 ART: 36.4 mmHg (ref 32.0–48.0)
PH ART: 7.432 (ref 7.350–7.450)
Patient temperature: 98.6
pO2, Arterial: 107 mmHg (ref 83.0–108.0)

## 2016-08-13 LAB — BRAIN NATRIURETIC PEPTIDE: B NATRIURETIC PEPTIDE 5: 699.5 pg/mL — AB (ref 0.0–100.0)

## 2016-08-13 LAB — I-STAT CG4 LACTIC ACID, ED: LACTIC ACID, VENOUS: 1.23 mmol/L (ref 0.5–1.9)

## 2016-08-13 LAB — STREP PNEUMONIAE URINARY ANTIGEN: STREP PNEUMO URINARY ANTIGEN: NEGATIVE

## 2016-08-13 LAB — MRSA PCR SCREENING: MRSA BY PCR: NEGATIVE

## 2016-08-13 LAB — POCT I-STAT TROPONIN I: Troponin i, poc: 0.05 ng/mL (ref 0.00–0.08)

## 2016-08-13 LAB — URINALYSIS, ROUTINE W REFLEX MICROSCOPIC
Bilirubin Urine: NEGATIVE
GLUCOSE, UA: NEGATIVE mg/dL
Ketones, ur: NEGATIVE mg/dL
Leukocytes, UA: NEGATIVE
Nitrite: NEGATIVE
PH: 5.5 (ref 5.0–8.0)
Protein, ur: NEGATIVE mg/dL
SPECIFIC GRAVITY, URINE: 1.01 (ref 1.005–1.030)

## 2016-08-13 LAB — PROTIME-INR
INR: 1.52
Prothrombin Time: 18.4 seconds — ABNORMAL HIGH (ref 11.4–15.2)

## 2016-08-13 LAB — INFLUENZA PANEL BY PCR (TYPE A & B)
INFLAPCR: NEGATIVE
INFLBPCR: NEGATIVE

## 2016-08-13 MED ORDER — PROMETHAZINE HCL 25 MG PO TABS: 12.5000 mg | ORAL_TABLET | Freq: Four times a day (QID) | ORAL | Status: DC | PRN

## 2016-08-13 MED ORDER — VANCOMYCIN HCL 10 G IV SOLR
2000.0000 mg | Freq: Once | INTRAVENOUS | Status: AC
  Administered 2016-08-13: 2000 mg via INTRAVENOUS
  Filled 2016-08-13: qty 2000

## 2016-08-13 MED ORDER — ACETAMINOPHEN 650 MG RE SUPP: 650.0000 mg | Freq: Four times a day (QID) | RECTAL | Status: DC | PRN

## 2016-08-13 MED ORDER — VANCOMYCIN HCL 10 G IV SOLR: 1250.0000 mg | INTRAVENOUS | Status: DC

## 2016-08-13 MED ORDER — ALLOPURINOL 300 MG PO TABS
300.0000 mg | ORAL_TABLET | Freq: Every day | ORAL | Status: DC
  Administered 2016-08-14 – 2016-08-18 (×5): 300 mg via ORAL
  Filled 2016-08-13 (×5): qty 1

## 2016-08-13 MED ORDER — HYDRALAZINE HCL 20 MG/ML IJ SOLN: 5.0000 mg | INTRAMUSCULAR | Status: DC | PRN

## 2016-08-13 MED ORDER — SODIUM CHLORIDE 0.9 % IV SOLN: 250.0000 mL | INTRAVENOUS | Status: DC | PRN

## 2016-08-13 MED ORDER — DEXTROSE 5 % IV SOLN
500.0000 mg | INTRAVENOUS | Status: DC
  Administered 2016-08-14 – 2016-08-16 (×3): 500 mg via INTRAVENOUS
  Filled 2016-08-13 (×4): qty 500

## 2016-08-13 MED ORDER — LEVOFLOXACIN IN D5W 750 MG/150ML IV SOLN
750.0000 mg | Freq: Once | INTRAVENOUS | Status: DC
  Filled 2016-08-13: qty 150

## 2016-08-13 MED ORDER — ACETAMINOPHEN 325 MG PO TABS
650.0000 mg | ORAL_TABLET | Freq: Four times a day (QID) | ORAL | Status: DC | PRN
  Administered 2016-08-14 – 2016-08-15 (×2): 650 mg via ORAL
  Filled 2016-08-13 (×2): qty 2

## 2016-08-13 MED ORDER — MAGNESIUM SULFATE 2 GM/50ML IV SOLN
2.0000 g | Freq: Once | INTRAVENOUS | Status: AC
  Administered 2016-08-13: 2 g via INTRAVENOUS
  Filled 2016-08-13: qty 50

## 2016-08-13 MED ORDER — LEVOFLOXACIN IN D5W 750 MG/150ML IV SOLN
750.0000 mg | Freq: Once | INTRAVENOUS | Status: AC
Start: 2016-08-13 — End: 2016-08-13
  Administered 2016-08-13: 750 mg via INTRAVENOUS

## 2016-08-13 MED ORDER — SODIUM CHLORIDE 0.9% FLUSH: 3.0000 mL | INTRAVENOUS | Status: DC | PRN

## 2016-08-13 MED ORDER — WARFARIN SODIUM 5 MG PO TABS
5.0000 mg | ORAL_TABLET | Freq: Once | ORAL | Status: AC
  Administered 2016-08-13: 5 mg via ORAL
  Filled 2016-08-13 (×2): qty 1

## 2016-08-13 MED ORDER — WARFARIN - PHARMACIST DOSING INPATIENT
Freq: Every day | Status: DC
  Administered 2016-08-14 – 2016-08-18 (×3)

## 2016-08-13 MED ORDER — LORATADINE 10 MG PO TABS
10.0000 mg | ORAL_TABLET | Freq: Every evening | ORAL | Status: DC
  Administered 2016-08-13 – 2016-08-18 (×6): 10 mg via ORAL
  Filled 2016-08-13 (×6): qty 1

## 2016-08-13 MED ORDER — TORSEMIDE 20 MG PO TABS: 40.0000 mg | ORAL_TABLET | Freq: Two times a day (BID) | ORAL | Status: DC

## 2016-08-13 MED ORDER — CARVEDILOL 3.125 MG PO TABS
3.1250 mg | ORAL_TABLET | Freq: Two times a day (BID) | ORAL | Status: DC
  Administered 2016-08-13 – 2016-08-18 (×11): 3.125 mg via ORAL
  Filled 2016-08-13 (×12): qty 1

## 2016-08-13 MED ORDER — POTASSIUM CHLORIDE CRYS ER 10 MEQ PO TBCR
10.0000 meq | EXTENDED_RELEASE_TABLET | Freq: Two times a day (BID) | ORAL | Status: DC
  Administered 2016-08-13 – 2016-08-14 (×3): 10 meq via ORAL
  Filled 2016-08-13 (×3): qty 1

## 2016-08-13 MED ORDER — METOLAZONE 2.5 MG PO TABS
2.5000 mg | ORAL_TABLET | ORAL | Status: DC
  Administered 2016-08-16: 2.5 mg via ORAL
  Filled 2016-08-13: qty 1

## 2016-08-13 MED ORDER — LEVOCETIRIZINE DIHYDROCHLORIDE 5 MG PO TABS: 5.0000 mg | ORAL_TABLET | Freq: Every evening | ORAL | Status: DC

## 2016-08-13 MED ORDER — SODIUM CHLORIDE 0.9% FLUSH
3.0000 mL | Freq: Two times a day (BID) | INTRAVENOUS | Status: DC
  Administered 2016-08-13 – 2016-08-18 (×10): 3 mL via INTRAVENOUS

## 2016-08-13 MED ORDER — IPRATROPIUM-ALBUTEROL 0.5-2.5 (3) MG/3ML IN SOLN
3.0000 mL | Freq: Once | RESPIRATORY_TRACT | Status: AC
  Administered 2016-08-13: 3 mL via RESPIRATORY_TRACT
  Filled 2016-08-13: qty 3

## 2016-08-13 MED ORDER — MAGNESIUM OXIDE 400 (241.3 MG) MG PO TABS
400.0000 mg | ORAL_TABLET | Freq: Every day | ORAL | Status: DC
  Administered 2016-08-14 – 2016-08-18 (×5): 400 mg via ORAL
  Filled 2016-08-13 (×5): qty 1

## 2016-08-13 MED ORDER — VANCOMYCIN HCL IN DEXTROSE 1-5 GM/200ML-% IV SOLN: 1000.0000 mg | Freq: Once | INTRAVENOUS | Status: DC

## 2016-08-13 MED ORDER — FUROSEMIDE 10 MG/ML IJ SOLN
100.0000 mg | Freq: Once | INTRAVENOUS | Status: AC
  Administered 2016-08-13: 100 mg via INTRAVENOUS
  Filled 2016-08-13: qty 10

## 2016-08-13 MED ORDER — GUAIFENESIN ER 600 MG PO TB12
600.0000 mg | ORAL_TABLET | Freq: Two times a day (BID) | ORAL | Status: DC
  Administered 2016-08-13 – 2016-08-18 (×11): 600 mg via ORAL
  Filled 2016-08-13 (×11): qty 1

## 2016-08-13 MED ORDER — CEFTRIAXONE SODIUM 1 G IJ SOLR
1.0000 g | INTRAMUSCULAR | Status: DC
  Administered 2016-08-13 – 2016-08-16 (×4): 1 g via INTRAVENOUS
  Filled 2016-08-13 (×5): qty 10

## 2016-08-13 MED ORDER — SIMVASTATIN 20 MG PO TABS
20.0000 mg | ORAL_TABLET | Freq: Every day | ORAL | Status: DC
  Administered 2016-08-14 – 2016-08-18 (×5): 20 mg via ORAL
  Filled 2016-08-13 (×6): qty 1

## 2016-08-13 MED ORDER — LEVOFLOXACIN IN D5W 750 MG/150ML IV SOLN: 750.0000 mg | INTRAVENOUS | Status: DC

## 2016-08-13 MED ORDER — PANTOPRAZOLE SODIUM 40 MG PO TBEC
40.0000 mg | DELAYED_RELEASE_TABLET | Freq: Two times a day (BID) | ORAL | Status: DC
  Administered 2016-08-13 – 2016-08-18 (×11): 40 mg via ORAL
  Filled 2016-08-13 (×11): qty 1
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
## 2016-08-13 NOTE — Progress Notes (Signed)
Pharmacy Antibiotic Note  Ruben Walker is a 76 y.o. male admitted on 08/13/2016 with pneumonia.  Pharmacy has been consulted for vancomycin/levofloxacin dosing. Tmax 102.5, wbc wnl, LA 1.23. SCr 1.8 (1.47 ~65mo ago), CrCl~36.  Plan: Vancomycin 2g IV x 1; then 1250mg  IV q24h Levofloxacin 750mg  IV q48h Monitor clinical progress, c/s, renal function, abx plan/LOT Vancomycin trough as indicated      Temp (24hrs), Avg:101.4 F (38.6 C), Min:100.2 F (37.9 C), Max:102.5 F (39.2 C)   Recent Labs Lab 08/13/16 1236 08/13/16 1247  WBC 8.2  --   LATICACIDVEN  --  1.23    Estimated Creatinine Clearance: 35.9 mL/min (A) (by C-G formula based on SCr of 1.82 mg/dL (H)).    No Known Allergies  Babs Bertin, PharmD, BCPS Clinical Pharmacist 08/13/2016 1:18 PM

## 2016-08-13 NOTE — ED Notes (Signed)
Pt states he feels more comfortable on BiPap.

## 2016-08-13 NOTE — ED Notes (Signed)
Called respiratory to start BIPAP for pt and duoneb.

## 2016-08-13 NOTE — Telephone Encounter (Signed)
I agree.  Thank you Harrold Donath.

## 2016-08-13 NOTE — Telephone Encounter (Signed)
Note patient in [facility]ED

## 2016-08-13 NOTE — ED Notes (Signed)
Got patient undressed on the  Monitor did ekg shown to Dr Rhunette Croft

## 2016-08-13 NOTE — ED Triage Notes (Signed)
Pt arrives from home via [facility]. Pt states he has been having increasing SOB x1 week. Upon EMS arrival pt tachypneic and using accessory muscles to breath, unable to complete full sentences. Placed on NR, oxygen sat came from 85% to 96%.
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
## 2016-08-13 NOTE — Progress Notes (Signed)
ANTICOAGULATION CONSULT NOTE  Pharmacy Consult for coumadin  Indication: atrial fibrillation  No Known Allergies  Patient Measurements: Height: 5\' 2"  (157.5 cm) Weight: 204 lb (92.5 kg) IBW/kg (Calculated) : 54.6  Vital Signs: Temp: 102.5 F (39.2 C) (03/26 1257) Temp Source: Rectal (03/26 1257) BP: 134/58 (03/26 1400) Pulse Rate: 69 (03/26 1400)  Labs:  Recent Labs  08/13/16 1236  HGB 10.9*  HCT 33.6*  PLT 132*  LABPROT 18.4*  INR 1.52  CREATININE 1.80*    Assessment: 76 yo male on coumadin for atrial fibrillation. INR on admission 1.52 with last dose taken on 3/25. CBC stable. Prior to admission dose is 5 mg on Tuesday and Thursday and 2.5 mg AOD's (dose decreased 4 weeks ago).  Goal of Therapy:  INR 2-3 Monitor platelets by anticoagulation protocol: Yes   Plan:  1. Coumadin 5 mg x 1 this evening 2. Daily INR  Pollyann Samples, PharmD, BCPS 08/13/2016, 4:01 PM

## 2016-08-13 NOTE — Progress Notes (Signed)
Dr Clearence Ped text paged with elevated troponin of 0.06

## 2016-08-13 NOTE — Progress Notes (Signed)
Pt received from ED on Bipap - CHG and MRSA swab done Pt oriented to room

## 2016-08-13 NOTE — Telephone Encounter (Signed)
Received call put through for urgency of SOB. Pt calling w request to be seen today by Dr. Duke Salvia only. Pt sounded extremely short of breath over phone. He was struggling to speak short sentences, had slow response to questions, required repeat questioning, 1-2 word responses.  I strongly advised evaluation in ED. Pt stated refusal to go to hospital, wanted to drive to office for appt. States "Dr. Duke Salvia can fix this". Reaffirmed recommendation to go to emergency room patient.  Informed patient I would call 911 and see if EMS can evaluate on scene. Patient reluctantly agreed to this. Patient confirmed listed home address. 911 call placed, I was patched from [facility] to [facility]. Explained situation. 911 dispatch informed me they would send someone to house to check on patient.

## 2016-08-14 ENCOUNTER — Encounter (HOSPITAL_COMMUNITY): Payer: Self-pay | Admitting: General Practice

## 2016-08-14 ENCOUNTER — Inpatient Hospital Stay (HOSPITAL_COMMUNITY): Payer: Medicare Other

## 2016-08-14 DIAGNOSIS — I5033 Acute on chronic diastolic (congestive) heart failure: Secondary | ICD-10-CM

## 2016-08-14 DIAGNOSIS — I509 Heart failure, unspecified: Secondary | ICD-10-CM

## 2016-08-14 DIAGNOSIS — L8990 Pressure ulcer of unspecified site, unspecified stage: Secondary | ICD-10-CM | POA: Insufficient documentation

## 2016-08-14 DIAGNOSIS — N183 Chronic kidney disease, stage 3 (moderate): Secondary | ICD-10-CM

## 2016-08-14 DIAGNOSIS — L89610 Pressure ulcer of right heel, unstageable: Secondary | ICD-10-CM | POA: Insufficient documentation

## 2016-08-14 DIAGNOSIS — I442 Atrioventricular block, complete: Secondary | ICD-10-CM

## 2016-08-14 DIAGNOSIS — Z7901 Long term (current) use of anticoagulants: Secondary | ICD-10-CM

## 2016-08-14 LAB — COMPREHENSIVE METABOLIC PANEL
ALT: 31 U/L (ref 17–63)
ANION GAP: 14 (ref 5–15)
AST: 60 U/L — ABNORMAL HIGH (ref 15–41)
Albumin: 3.3 g/dL — ABNORMAL LOW (ref 3.5–5.0)
Alkaline Phosphatase: 47 U/L (ref 38–126)
BILIRUBIN TOTAL: 0.9 mg/dL (ref 0.3–1.2)
BUN: 38 mg/dL — ABNORMAL HIGH (ref 6–20)
CHLORIDE: 97 mmol/L — AB (ref 101–111)
CO2: 25 mmol/L (ref 22–32)
Calcium: 8.6 mg/dL — ABNORMAL LOW (ref 8.9–10.3)
Creatinine, Ser: 1.73 mg/dL — ABNORMAL HIGH (ref 0.61–1.24)
GFR calc Af Amer: 43 mL/min — ABNORMAL LOW (ref >60)
GFR, EST NON AFRICAN AMERICAN: 37 mL/min — AB (ref >60)
Glucose, Bld: 92 mg/dL (ref 65–99)
POTASSIUM: 3.6 mmol/L (ref 3.5–5.1)
Sodium: 136 mmol/L (ref 135–145)
TOTAL PROTEIN: 5.5 g/dL — AB (ref 6.5–8.1)

## 2016-08-14 LAB — ECHOCARDIOGRAM COMPLETE
Height: 62 in
WEIGHTICAEL: 3264 [oz_av]

## 2016-08-14 LAB — HIV ANTIBODY (ROUTINE TESTING W REFLEX): HIV Screen 4th Generation wRfx: NONREACTIVE

## 2016-08-14 LAB — CBC
HEMATOCRIT: 31.3 % — AB (ref 39.0–52.0)
Hemoglobin: 10.3 g/dL — ABNORMAL LOW (ref 13.0–17.0)
MCH: 30.6 pg (ref 26.0–34.0)
MCHC: 32.9 g/dL (ref 30.0–36.0)
MCV: 92.9 fL (ref 78.0–100.0)
Platelets: 132 10*3/uL — ABNORMAL LOW (ref 150–400)
RBC: 3.37 MIL/uL — ABNORMAL LOW (ref 4.22–5.81)
RDW: 14.6 % (ref 11.5–15.5)
WBC: 8.1 10*3/uL (ref 4.0–10.5)

## 2016-08-14 LAB — RESPIRATORY PANEL BY PCR

## 2016-08-14 LAB — TROPONIN I: Troponin I: 0.06 ng/mL (ref <0.03)

## 2016-08-14 LAB — PROTIME-INR
INR: 1.65
Prothrombin Time: 19.7 s — ABNORMAL HIGH (ref 11.4–15.2)

## 2016-08-14 LAB — MAGNESIUM: MAGNESIUM: 2 mg/dL (ref 1.7–2.4)

## 2016-08-14 LAB — LEGIONELLA PNEUMOPHILA SEROGP 1 UR AG: L. PNEUMOPHILA SEROGP 1 UR AG: NEGATIVE

## 2016-08-14 MED ORDER — PERFLUTREN LIPID MICROSPHERE
1.0000 mL | INTRAVENOUS | Status: AC | PRN
  Administered 2016-08-14: 3 mL via INTRAVENOUS
  Filled 2016-08-14: qty 10

## 2016-08-14 MED ORDER — FUROSEMIDE 10 MG/ML IJ SOLN: 80.0000 mg | Freq: Two times a day (BID) | INTRAMUSCULAR | Status: DC

## 2016-08-14 MED ORDER — WARFARIN SODIUM 5 MG PO TABS
5.0000 mg | ORAL_TABLET | Freq: Once | ORAL | Status: AC
  Administered 2016-08-14: 5 mg via ORAL
  Filled 2016-08-14: qty 1

## 2016-08-14 MED ORDER — FUROSEMIDE 10 MG/ML IJ SOLN
80.0000 mg | Freq: Two times a day (BID) | INTRAMUSCULAR | Status: DC
  Administered 2016-08-14 – 2016-08-17 (×7): 80 mg via INTRAVENOUS
  Filled 2016-08-14 (×7): qty 8

## 2016-08-14 MED ORDER — PERFLUTREN LIPID MICROSPHERE
INTRAVENOUS | Status: AC
  Filled 2016-08-14: qty 10
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
## 2016-08-14 NOTE — Progress Notes (Signed)
ANTICOAGULATION CONSULT NOTE  Pharmacy Consult for coumadin  Indication: atrial fibrillation  No Known Allergies  Patient Measurements: Height: 5\' 2"  (157.5 cm) Weight: 204 lb (92.5 kg) IBW/kg (Calculated) : 54.6  Vital Signs: Temp: 97.7 F (36.5 C) (03/27 0756) Temp Source: Axillary (03/27 0756) BP: 119/63 (03/27 1100) Pulse Rate: 71 (03/27 1100)  Labs:  Recent Labs  08/13/16 1236 08/13/16 1608 08/13/16 2124 08/14/16 0246  HGB 10.9*  --   --  10.3*  HCT 33.6*  --   --  31.3*  PLT 132*  --   --  132*  LABPROT 18.4*  --   --  19.7*  INR 1.52  --   --  1.65  CREATININE 1.80*  --   --  1.73*  TROPONINI  --  0.06* 0.06* 0.06*    Assessment: 76 yo male on coumadin for atrial fibrillation. INR on admission 1.52 with last dose taken on 3/25. CBC stable. Prior to admission dose is 5 mg on Tuesday and Thursday and 2.5 mg AOD's (dose decreased 4 weeks ago).  INR today remains SUBtherapeutic (INR 1.65 << 1.52, goal of 2-3), CBC stable. No bleeding noted.   Goal of Therapy:  INR 2-3 Monitor platelets by anticoagulation protocol: Yes   Plan:  1. Repeat Warfarin 5 mg x 1 dose at 1800 today 2. Will continue to monitor for any signs/symptoms of bleeding and will follow up with PT/INR in the a.m.   Thank you for allowing pharmacy to be a part of this patient's care.  Georgina Pillion, PharmD, BCPS Clinical Pharmacist Pager: [phone] Clinical phone for 08/14/2016 from 7a-3:30p: [phone] If after 3:30p, please call main pharmacy at: [phone] 08/14/2016 11:17 AM

## 2016-08-14 NOTE — Care Management Note (Signed)
Case Management Note  Patient Details  Name: Ruben Walker MRN: [id number] Date of Birth: [birth year]  Subjective/Objective:      From home alone,  Presents with acute resp failure secondary to CHF and PNA.  Was taken off bipap last pm and failed.  He is having labored breathing, conts to be on bipap.  NCM will cont to follow for dc needs.             Action/Plan:   Expected Discharge Date:                  Expected Discharge Plan:     In-House Referral:     Discharge planning Services  CM Consult  Walker Acute Care Choice:    Choice offered to:     DME Arranged:    DME Agency:     HH Arranged:    HH Agency:     Status of Service:  In process, will continue to follow  If discussed at Long Length of Stay Meetings, dates discussed:    Additional Comments:  Leone Haven, RN 08/14/2016, 4:36 PM

## 2016-08-14 NOTE — Progress Notes (Addendum)
PROGRESS NOTE    Ruben Walker  DDU:[id number] DOB: 02-15-1941 DOA: 08/13/2016 PCP: [name], MD   Brief Narrative: JAXEL TRESTER is a 76 y.o. male with a history of complete heart block status post ablation with subsequent cardiac arrest in 2000 status post ICD placement, chronic atrial fibrillation, C KD, CHF with an EF of 45%, GERD, hyperlipidemia, hypertension, anemia, OSA. He presented with acute respiratory failure secondary to a combination of acute CHF and pneumonia.   Assessment & Plan:   Active Problems:   Essential hypertension   Permanent atrial fibrillation (HCC)   Long term current use of anticoagulant therapy   Complete heart block  s/p AV ablation   ICD (implantable cardioverter-defibrillator) in place   Chronic renal insufficiency, stage III (moderate)   Acute respiratory failure with hypoxemia (HCC)   CAP (community acquired pneumonia)   AKI (acute kidney injury) (HCC)   Pressure injury of skin   Acute respiratory failure Combination of acute CHF and community acquired pneumonia. Still requiring bipap at this time. Normal mental status -wean to room air -treat CHF and pneumonia -repeat chest x-ray  Acute on chronic systolic CHF Last EF actually improved to 55-60% on last echo on 11/23/2015. Follows with Dr. Duke Salvia as an outpatient. AICD -lasix IV, hold demadex -I/O -daily weights -echocardiogram  Community acquired pneumonia Febrile overnight. On bipap -continue Ceftriaxone and azithromycin -sputum gram stain pending -sputum culture pending -urine strep/legionella  Acute kidney injury on CKD stage 3 Baseline of 1.4. 1.84 on admission. Likely secondary to cardiorenal syndrome -diuresis  Complete heart block S/p ablation. AICD  Atrial fibrillation Cardiac arrest AICD in place -continue Coumadin -telemetry  Essential hypertension -continue to hold losartan secondary to AKI -continue coreg  GERD -continue PPI   DVT  prophylaxis: Warfarin Code Status: Full code Family Communication: None at bedside Disposition Plan: Discharge home pending improvement of respiratory status   Consultants:   None  Procedures:  BiPAP  Antimicrobials:  Ceftriaxone  Azithromycin    Subjective: Patient reports no chest pain or dyspnea.  Objective: Vitals:   08/14/16 0612 08/14/16 0700 08/14/16 0756 08/14/16 0832  BP:  (!) 117/53 (!) 109/52   Pulse:  70 70   Resp:  20 20   Temp: 98.5 F (36.9 C)  97.7 F (36.5 C)   TempSrc: Oral  Axillary   SpO2:  97% 99% 100%  Weight:      Height:        Intake/Output Summary (Last 24 hours) at 08/14/16 1056 Last data filed at 08/14/16 0840  Gross per 24 hour  Intake              150 ml  Output             2825 ml  Net            -2675 ml   Filed Weights   08/13/16 1338  Weight: 92.5 kg (204 lb)    Examination:  General exam: Appears calm and comfortable. BiPAP mask on face. Respiratory system: Diminished breath sounds. Respiratory effort normal. Cardiovascular system: S1 & S2 heard, RRR. No murmurs. Gastrointestinal system: Abdomen is nondistended, soft and nontender. Normal bowel sounds heard. Central nervous system: Alert and oriented. No focal neurological deficits. Extremities: No edema. No calf tenderness Skin: No cyanosis. Psychiatry: Judgement and insight appear normal. Mood & affect appropriate.     Data Reviewed: I have personally reviewed following labs and imaging studies  CBC:  Recent Labs Lab 08/13/16  1236 08/14/16 0246  WBC 8.2 8.1  NEUTROABS 6.2  --   HGB 10.9* 10.3*  HCT 33.6* 31.3*  MCV 93.1 92.9  PLT 132* 132*   Basic Metabolic Panel:  Recent Labs Lab 08/13/16 1236 08/14/16 0246  NA 134* 136  K 4.4 3.6  CL 96* 97*  CO2 27 25  GLUCOSE 110* 92  BUN 39* 38*  CREATININE 1.80* 1.73*  CALCIUM 9.1 8.6*  MG  --  2.0   GFR: Estimated Creatinine Clearance: 37 mL/min (A) (by C-G formula based on SCr of 1.73 mg/dL  (H)). Liver Function Tests:  Recent Labs Lab 08/14/16 0246  AST 60*  ALT 31  ALKPHOS 47  BILITOT 0.9  PROT 5.5*  ALBUMIN 3.3*   No results for input(s): LIPASE, AMYLASE in the last 168 hours. No results for input(s): AMMONIA in the last 168 hours. Coagulation Profile:  Recent Labs Lab 08/13/16 1236 08/14/16 0246  INR 1.52 1.65   Cardiac Enzymes:  Recent Labs Lab 08/13/16 1608 08/13/16 2124 08/14/16 0246  TROPONINI 0.06* 0.06* 0.06*   BNP (last 3 results) No results for input(s): PROBNP in the last 8760 hours. HbA1C: No results for input(s): HGBA1C in the last 72 hours. CBG: No results for input(s): GLUCAP in the last 168 hours. Lipid Profile: No results for input(s): CHOL, HDL, LDLCALC, TRIG, CHOLHDL, LDLDIRECT in the last 72 hours. Thyroid Function Tests: No results for input(s): TSH, T4TOTAL, FREET4, T3FREE, THYROIDAB in the last 72 hours. Anemia Panel: No results for input(s): VITAMINB12, FOLATE, FERRITIN, TIBC, IRON, RETICCTPCT in the last 72 hours. Sepsis Labs:  Recent Labs Lab 08/13/16 1247 08/13/16 1703  LATICACIDVEN 1.23 1.60    Recent Results (from the past 240 hour(s))  MRSA PCR Screening     Status: None   Collection Time: 08/13/16  6:42 PM  Result Value Ref Range Status   MRSA by PCR NEGATIVE NEGATIVE Final    Comment:        The GeneXpert MRSA Assay (FDA approved for NASAL specimens only), is one component of a comprehensive MRSA colonization surveillance program. It is not intended to diagnose MRSA infection nor to guide or monitor treatment for MRSA infections.          Radiology Studies: Dg Chest Port 1 View  Result Date: 08/14/2016 CLINICAL DATA:  Acute respiratory failure with hypoxia. EXAM: PORTABLE CHEST 1 VIEW COMPARISON:  08/13/2016 FINDINGS: Cardiac pacemaker in stable position. Cardiomediastinal silhouette is stably enlarged. There is better aeration of the lungs, with residual patchy airspace consolidation in the  left mid and lower lung field. Mild increase of the interstitial markings. Osseous structures are without acute abnormality. Soft tissues are grossly normal. IMPRESSION: Better aeration of the lungs consistent with improving pulmonary edema. There is however apparent patchy airspace consolidation in the left mid and lower lung field, which may represent infectious consolidation. Electronically Signed   By: Ted Mcalpine M.D.   On: 08/14/2016 08:28   Dg Chest Portable 1 View  Result Date: 08/13/2016 CLINICAL DATA:  Shortness of breath. EXAM: PORTABLE CHEST 1 VIEW COMPARISON:  10/01/2014. FINDINGS: Cardiac pacer noted with lead tip projected right ventricle. Cardiomegaly with pulmonary vascular prominence and bilateral interstitial prominence consistent CHF. Small bilateral pleural effusions. IMPRESSION: Congestive heart failure with bilateral pulmonary interstitial edema . Electronically Signed   By: Maisie Fus  Register   On: 08/13/2016 12:31        Scheduled Meds: . allopurinol  300 mg Oral Daily  . azithromycin  500  mg Intravenous Q24H  . carvedilol  3.125 mg Oral BID  . cefTRIAXone (ROCEPHIN)  IV  1 g Intravenous Q24H  . guaiFENesin  600 mg Oral BID  . loratadine  10 mg Oral QPM  . magnesium oxide  400 mg Oral Daily  . metolazone  2.5 mg Oral Once per day on Mon Thu  . pantoprazole  40 mg Oral BID  . potassium chloride  10 mEq Oral BID  . simvastatin  20 mg Oral q1800  . sodium chloride flush  3 mL Intravenous Q12H  . torsemide  40 mg Oral BID  . Warfarin - Pharmacist Dosing Inpatient   Does not apply q1800   Continuous Infusions:   LOS: 1 day     Jacquelin Hawking [facility] 08/14/2016, 10:56 AM Pager: [phone]  If 7PM-7AM, please contact night-coverage [email or web address] Password [facility] 08/14/2016, 10:56 AM

## 2016-08-14 NOTE — Progress Notes (Signed)
  Echocardiogram 2D Echocardiogram has been performed with definity.  Marisue Humble 08/14/2016, 11:29 AM

## 2016-08-15 LAB — BASIC METABOLIC PANEL
Anion gap: 11 (ref 5–15)
BUN: 38 mg/dL — AB (ref 6–20)
CALCIUM: 9 mg/dL (ref 8.9–10.3)
CO2: 28 mmol/L (ref 22–32)
CREATININE: 1.67 mg/dL — AB (ref 0.61–1.24)
Chloride: 98 mmol/L — ABNORMAL LOW (ref 101–111)
GFR calc Af Amer: 45 mL/min — ABNORMAL LOW (ref >60)
GFR calc non Af Amer: 39 mL/min — ABNORMAL LOW (ref >60)
GLUCOSE: 97 mg/dL (ref 65–99)
Potassium: 3.4 mmol/L — ABNORMAL LOW (ref 3.5–5.1)
Sodium: 137 mmol/L (ref 135–145)

## 2016-08-15 LAB — PROTIME-INR
INR: 2.13
Prothrombin Time: 24.1 seconds — ABNORMAL HIGH (ref 11.4–15.2)

## 2016-08-15 MED ORDER — POTASSIUM CHLORIDE CRYS ER 20 MEQ PO TBCR
40.0000 meq | EXTENDED_RELEASE_TABLET | Freq: Once | ORAL | Status: AC
  Administered 2016-08-15: 40 meq via ORAL
  Filled 2016-08-15: qty 2

## 2016-08-15 MED ORDER — POTASSIUM CHLORIDE CRYS ER 20 MEQ PO TBCR
20.0000 meq | EXTENDED_RELEASE_TABLET | Freq: Two times a day (BID) | ORAL | Status: DC
  Administered 2016-08-15 – 2016-08-18 (×8): 20 meq via ORAL
  Filled 2016-08-15 (×8): qty 1

## 2016-08-15 MED ORDER — WARFARIN SODIUM 2.5 MG PO TABS
2.5000 mg | ORAL_TABLET | Freq: Once | ORAL | Status: AC
  Administered 2016-08-15: 2.5 mg via ORAL
  Filled 2016-08-15: qty 1

## 2016-08-15 MED ORDER — ALBUTEROL SULFATE (2.5 MG/3ML) 0.083% IN NEBU
2.5000 mg | INHALATION_SOLUTION | RESPIRATORY_TRACT | Status: DC | PRN
  Administered 2016-08-15 – 2016-08-17 (×2): 2.5 mg via RESPIRATORY_TRACT
  Filled 2016-08-15 (×3): qty 3

## 2016-08-15 NOTE — Progress Notes (Signed)
BiPAP on standby pt on room air SATS 100%

## 2016-08-15 NOTE — Sepsis Progress Note (Signed)
Pt transferred to room 3e20.  Pt still has orders for continuous bipap.  MD paged, ordered to DC continuous bipap.  Will carry out orders and continue to monitor.

## 2016-08-15 NOTE — Care Management Note (Addendum)
Case Management Note  Patient Details  Name: Ruben Walker MRN: [id number] Date of Birth: 07-15-1940  Subjective/Objective:    CHF, Pneumonia                Action/Plan: Discharge Planning: NCM spoke to pt and lives alone. Was independent prior to hospital stay. Drives to his MD appts. Has an old scale that is not accurate. [name] [phone] or [phone] will provide him a ride home. Attempted call to friend and left message. Pt wanted his friend to know he was in hospital. Will need oxygen for home and possible neb machine. Offered choice for HH/list provided. Agreeable to [facility] for [facility]. Will need HH orders. Attending notified. Contacted [employer] with new referral.   Pt states [facility] [address], [place] is the funeral home he wants  called if he passes away.   [name] RN CM 3/31 Patient with orders to DC to home with home health PT RN, notified [facility] of DC, and DME RW and O2, referral made to [facility] for DME to be brought to room prior to discharge.   Expected Discharge Date:                 Expected Discharge Plan:  Home w Home Health Services  In-House Referral:  NA  Discharge planning Services  CM Consult  Post Acute Care Choice:  Home Health Choice offered to:  Patient  DME Arranged:    DME Agency:  [facility]  HH Arranged:  Disease Management, RN HH Agency:  [facility]  Status of Service:  In process, will continue to follow  If discussed at Long Length of Stay Meetings, dates discussed:    Additional Comments:  Elliot Cousin, RN 08/15/2016, 12:18 PM

## 2016-08-15 NOTE — Progress Notes (Signed)
Progress Note  Patient Name: Ruben Walker Date of Encounter: 08/15/2016  Primary Cardiologist:  Dr. Duke Salvia Dr. Graciela Husbands (EP)  Patient Profile     76 y.o. male w/ hx HTN, perm Afib s/p AV node ablation, on coumadin, ICD after cardiac arrest w/ no sig CAD, s/p gen change 05/2016, OSA, D-CHF w/ EF 55% 11/2015 echo, CKD III, obesity. Admit 03/26 w/ fever/cough, cards saw 03/28 for CHF.  Subjective   Coughing up yellowish, foamy sputum. Still SOB at rest but feels is breathing a little better  Inpatient Medications    Scheduled Meds: . allopurinol  300 mg Oral Daily  . azithromycin  500 mg Intravenous Q24H  . carvedilol  3.125 mg Oral BID  . cefTRIAXone (ROCEPHIN)  IV  1 g Intravenous Q24H  . furosemide  80 mg Intravenous BID  . guaiFENesin  600 mg Oral BID  . loratadine  10 mg Oral QPM  . magnesium oxide  400 mg Oral Daily  . metolazone  2.5 mg Oral Once per day on Mon Thu  . pantoprazole  40 mg Oral BID  . potassium chloride  10 mEq Oral BID  . simvastatin  20 mg Oral q1800  . sodium chloride flush  3 mL Intravenous Q12H  . Warfarin - Pharmacist Dosing Inpatient   Does not apply q1800   Continuous Infusions:  PRN Meds: sodium chloride, acetaminophen **OR** acetaminophen, hydrALAZINE, promethazine, sodium chloride flush   Vital Signs    Vitals:   08/15/16 0356 08/15/16 0500 08/15/16 0550 08/15/16 0722  BP:      Pulse:      Resp: (!) 21  (!) 26   Temp:      TempSrc:      SpO2:   100% 98%  Weight:  211 lb (95.7 kg)    Height:        Intake/Output Summary (Last 24 hours) at 08/15/16 0724 Last data filed at 08/15/16 0550  Gross per 24 hour  Intake              820 ml  Output             2050 ml  Net            -1230 ml   Filed Weights   08/13/16 1338 08/15/16 0500  Weight: 204 lb (92.5 kg) 211 lb (95.7 kg)    Telemetry    Afib, V pacing - Personally Reviewed  ECG    n/a - Personally Reviewed  Physical Exam   General: Well developed, well  nourished, male appearing in no acute distress. Head: Normocephalic, atraumatic.  Neck: Supple without bruits, JVD at 11 cm. Lungs:  Resp regular and mod labored, +wheeze, +rales, +rhonchi Heart: RRR, S1, S2, no S3, S4, soft murmur; no rub. Abdomen: Soft, non-tender, non-distended with normoactive bowel sounds. No hepatomegaly. No rebound/guarding. No obvious abdominal masses. Extremities: No clubbing, cyanosis, no sig edema. Distal pedal pulses are 2+ bilaterally. Neuro: Alert and oriented X 3. Moves all extremities spontaneously. Psych: Normal affect.  Labs    Hematology Recent Labs Lab 08/13/16 1236 08/14/16 0246  WBC 8.2 8.1  RBC 3.61* 3.37*  HGB 10.9* 10.3*  HCT 33.6* 31.3*  MCV 93.1 92.9  MCH 30.2 30.6  MCHC 32.4 32.9  RDW 14.2 14.6  PLT 132* 132*    Chemistry Recent Labs Lab 08/13/16 1236 08/14/16 0246 08/15/16 0417  NA 134* 136 137  K 4.4 3.6 3.4*  CL 96* 97* 98*  CO2 27 25 28   GLUCOSE 110* 92 97  BUN 39* 38* 38*  CREATININE 1.80* 1.73* 1.67*  CALCIUM 9.1 8.6* 9.0  PROT  --  5.5*  --   ALBUMIN  --  3.3*  --   AST  --  60*  --   ALT  --  31  --   ALKPHOS  --  47  --   BILITOT  --  0.9  --   GFRNONAA 35* 37* 39*  GFRAA 41* 43* 45*  ANIONGAP 11 14 11      Cardiac Enzymes Recent Labs Lab 08/13/16 1608 08/13/16 2124 08/14/16 0246  TROPONINI 0.06* 0.06* 0.06*    Recent Labs Lab 08/13/16 1700  TROPIPOC 0.05     BNP Recent Labs Lab 08/13/16 1236  BNP 699.5*    Lab Results  Component Value Date   INR 2.13 08/15/2016   INR 1.65 08/14/2016   INR 1.52 08/13/2016    Radiology    Dg Chest Port 1 View Result Date: 08/14/2016 CLINICAL DATA:  Acute respiratory failure with hypoxia. EXAM: PORTABLE CHEST 1 VIEW COMPARISON:  08/13/2016 FINDINGS: Cardiac pacemaker in stable position. Cardiomediastinal silhouette is stably enlarged. There is better aeration of the lungs, with residual patchy airspace consolidation in the left mid and lower lung  field. Mild increase of the interstitial markings. Osseous structures are without acute abnormality. Soft tissues are grossly normal. IMPRESSION: Better aeration of the lungs consistent with improving pulmonary edema. There is however apparent patchy airspace consolidation in the left mid and lower lung field, which may represent infectious consolidation. Electronically Signed   By: Ted Mcalpine M.D.   On: 08/14/2016 08:28   Dg Chest Portable 1 View Result Date: 08/13/2016 CLINICAL DATA:  Shortness of breath. EXAM: PORTABLE CHEST 1 VIEW COMPARISON:  10/01/2014. FINDINGS: Cardiac pacer noted with lead tip projected right ventricle. Cardiomegaly with pulmonary vascular prominence and bilateral interstitial prominence consistent CHF. Small bilateral pleural effusions. IMPRESSION: Congestive heart failure with bilateral pulmonary interstitial edema . Electronically Signed   By: Maisie Fus  Register   On: 08/13/2016 12:31     Cardiac Studies   ECHO: 08/14/2016 - Left ventricle: The cavity size was normal. Wall thickness was   normal. Systolic function was normal. The estimated ejection   fraction was in the range of 55% to 60%. Wall motion was normal;   there were no regional wall motion abnormalities. The study is   not technically sufficient to allow evaluation of LV diastolic function. - Mitral valve: Calcified annulus. There was mild regurgitation. - Left atrium: The atrium was severely dilated. - Right atrium: The atrium was moderately dilated. - Tricuspid valve: There was moderate regurgitation. - Pulmonic valve: Peak gradient (S): 37 mm Hg. - Pulmonary arteries: Systolic pressure was moderately increased. Impressions: - Technically difficult; definity used; normal LV systolic   function; biatrial enlargement; mild MR; moderate TR with   moderately elevated pulmonary pressure.  Patient Profile     76 y.o. male w/ hx HTN, perm Afib s/p AV node ablation, on coumadin, ICD after cardiac  arrest w/ no sig CAD, s/p gen change 05/2016, OSA, D-CHF w/ EF 55% 11/2015 echo, CKD III, obesity. Admit 03/26 w/ fever/cough, cards saw 03/28 for CHF.  Assessment & Plan    Acute respiratory failure 2/2 acute CHF and CAP: still on BIPAP.   Acute on chronic diastolic CHF: in the setting of acute PNA.  - ECHO this admission with EF 55-60%.  - Currently on lasix 80mg   BID. Net neg 3.755L.  - Weight 211 lbs which is actually 7 lbs higher than admit weight,  when he saw Dr. Duke Salvia.  - Creat improving with diuresis 1.8--> 1.73>>1.67. Would continue this current dose  Elevated troponin: low and flat trend: 0.6--> 0.6--> 0.06 c/w demand ischemia in the setting of acute CHF and PNA  AKI: creat improving with diuresis. 1.8--> 1.73-->1.67  HTN: BP well controlled.   Chronic atrial fibrillation: s/p AV ablation on coumadin, CHADSVASC=4 (HTN, CHF, CAD, age x 1).   HLD: continue statin   ICD: with recent gen change in 05/2016. Followed by Dr. Graciela Husbands   Active Problems:   Essential hypertension   Permanent atrial fibrillation (HCC)   Long term current use of anticoagulant therapy   Complete heart block  s/p AV ablation   ICD (implantable cardioverter-defibrillator) in place   Chronic renal insufficiency, stage III (moderate)   Acute respiratory failure with hypoxemia (HCC)   CAP (community acquired pneumonia)   AKI (acute kidney injury) (HCC)   Pressure injury of skin    Signed, Theodore Demark , PA-C 7:24 AM 08/15/2016 Pager: [phone]  Patient seen, examined. Available data reviewed. Agree with findings, assessment, and plan as outlined by Theodore Demark, PA-C. On exam, the patient is an obese male in no distress. JVP is difficult to assess because of his body habitus but appears to be mildly elevated. There is diffuse wheezing on his lung exam but improved air movement compared to yesterday's exam. Heart is regular rate and rhythm without murmur or gallop. Abdomen is soft and  obese, nontender. Extremities show no edema.  Lab data is reviewed. Creatinine is trending downward with diuresis. Will continue IV diuresis today. He has 4 L negative fluid balance since admission. I do not think his weights are accurate. We will ask for him to be weighed on a scale rather than in bed. Follow-up metabolic panel tomorrow. Treatment of pneumonia per the primary team. DUring my evaluation his oxygen saturations are 95-100% on room air, improved significantly from yesterday's evaluation.   Tonny Bollman, M.D. 08/15/2016 8:21 AM

## 2016-08-15 NOTE — Progress Notes (Signed)
ANTICOAGULATION CONSULT NOTE  Pharmacy Consult for coumadin  Indication: atrial fibrillation  No Known Allergies  Patient Measurements: Height: 5\' 2"  (157.5 cm) Weight: 211 lb (95.7 kg) IBW/kg (Calculated) : 54.6  Vital Signs: Temp: 97.8 F (36.6 C) (03/28 0800) Temp Source: Oral (03/28 0800) BP: 124/72 (03/28 1000) Pulse Rate: 74 (03/28 1000)  Labs:  Recent Labs  08/13/16 1236 08/13/16 1608 08/13/16 2124 08/14/16 0246 08/15/16 0417  HGB 10.9*  --   --  10.3*  --   HCT 33.6*  --   --  31.3*  --   PLT 132*  --   --  132*  --   LABPROT 18.4*  --   --  19.7* 24.1*  INR 1.52  --   --  1.65 2.13  CREATININE 1.80*  --   --  1.73* 1.67*  TROPONINI  --  0.06* 0.06* 0.06*  --     Assessment: 76 yo male on coumadin for atrial fibrillation. INR on admission 1.52 with last dose taken on 3/25. CBC stable. Prior to admission dose is 5 mg on Tuesday and Thursday and 2.5 mg AOD's (dose decreased 4 weeks ago).  INR today is therapeutic (INR 2.13 << 1.65, goal of 2-3), No CBC today- stable from 3/27 labs. No bleeding noted.   Goal of Therapy:  INR 2-3 Monitor platelets by anticoagulation protocol: Yes   Plan:  1. Warfarin 2.5 mg x 1 dose at 1800 today 2. Will continue to monitor for any signs/symptoms of bleeding and will follow up with PT/INR in the a.m.   Thank you for allowing pharmacy to be a part of this patient's care.  Georgina Pillion, PharmD, BCPS Clinical Pharmacist Pager: [phone] Clinical phone for 08/15/2016 from 7a-3:30p: [phone] If after 3:30p, please call main pharmacy at: [phone] 08/15/2016 10:59 AM

## 2016-08-15 NOTE — Progress Notes (Signed)
PROGRESS NOTE    Ruben Walker  HYI:[id number] DOB: 11/19/1940 DOA: 08/13/2016 PCP: [name], MD   Brief Narrative: Ruben Walker is a 76 y.o. male with a history of complete heart block status post ablation with subsequent cardiac arrest in 2000 status post ICD placement, chronic atrial fibrillation, C KD, CHF with an EF of 45%, GERD, hyperlipidemia, hypertension, anemia, OSA. He presented with acute respiratory failure secondary to a combination of acute CHF and pneumonia.   Assessment & Plan:   Active Problems:   Essential hypertension   Permanent atrial fibrillation (HCC)   Long term current use of anticoagulant therapy   Complete heart block  s/p AV ablation   ICD (implantable cardioverter-defibrillator) in place   Chronic renal insufficiency, stage III (moderate)   Acute respiratory failure with hypoxemia (HCC)   CAP (community acquired pneumonia)   AKI (acute kidney injury) (HCC)   Acute on chronic diastolic heart failure (HCC)   Pressure injury of skin   Acute respiratory failure Combination of acute CHF and community acquired pneumonia. Still requiring bipap at this time. Normal mental status - continue to try and wean to room air - continue to treat CHF and pneumonia, improving on current medication regimen Lasix 80 mg IV twice a day and azithromycin and ceftriaxone  Acute on chronic systolic CHF Last EF actually improved to 55-60% on last echo on 11/23/2015. Follows with Dr. Duke Salvia as an outpatient. AICD -lasix IV, hold demadex -I/O -daily weights -echocardiogram completed in reporting EF 55-60% with no wall motion abnormality and inability to assess diastolic function  Hypokalemia - currently on K replacement, will continue  Community acquired pneumonia Febrile overnight. On bipap -continue Ceftriaxone and azithromycin -sputum gram stain pending -sputum culture pending -urine strep/legionella, negative  Acute kidney injury on CKD stage  3 Baseline of 1.4. 1.84 on admission. Likely secondary to cardiorenal syndrome -diuresis  Complete heart block S/p ablation. AICD  Atrial fibrillation Cardiac arrest AICD in place -continue Coumadin -telemetry  Essential hypertension -continue to hold losartan secondary to AKI -continue coreg  GERD -continue PPI   DVT prophylaxis: Warfarin Code Status: Full code Family Communication: None at bedside Disposition Plan: Discharge home pending improvement of respiratory status   Consultants:   None  Procedures:   Antimicrobials:  Ceftriaxone  Azithromycin    Subjective: Patient reports improvement in breathing condition but not at baseline and coughing up a lot of sputum  Objective: Vitals:   08/15/16 0500 08/15/16 0550 08/15/16 0722 08/15/16 0800  BP:    124/74  Pulse:    68  Resp:  (!) 26  20  Temp:    97.8 F (36.6 C)  TempSrc:    Oral  SpO2:  100% 98% 95%  Weight: 95.7 kg (211 lb)     Height:        Intake/Output Summary (Last 24 hours) at 08/15/16 1015 Last data filed at 08/15/16 0825  Gross per 24 hour  Intake              580 ml  Output             2000 ml  Net            -1420 ml   Filed Weights   08/13/16 1338 08/15/16 0500  Weight: 92.5 kg (204 lb) 95.7 kg (211 lb)    Examination:  General exam: Appears calm and comfortable, in nad.  Respiratory system: Diminished breath sounds. Respiratory effort normal. Equal chest rise.  Cardiovascular system: S1 & S2 heard, RRR. No murmurs. Gastrointestinal system: Abdomen is nondistended, soft and nontender. Normal bowel sounds heard. Central nervous system: Alert and oriented. No focal neurological deficits. Extremities: No edema. No calf tenderness Skin: No cyanosis. Warm, dry Psychiatry: Judgement and insight appear normal. Mood & affect appropriate.   Data Reviewed: I have personally reviewed following labs and imaging studies  CBC:  Recent Labs Lab 08/13/16 1236 08/14/16 0246  WBC  8.2 8.1  NEUTROABS 6.2  --   HGB 10.9* 10.3*  HCT 33.6* 31.3*  MCV 93.1 92.9  PLT 132* 132*   Basic Metabolic Panel:  Recent Labs Lab 08/13/16 1236 08/14/16 0246 08/15/16 0417  NA 134* 136 137  K 4.4 3.6 3.4*  CL 96* 97* 98*  CO2 27 25 28   GLUCOSE 110* 92 97  BUN 39* 38* 38*  CREATININE 1.80* 1.73* 1.67*  CALCIUM 9.1 8.6* 9.0  MG  --  2.0  --    GFR: Estimated Creatinine Clearance: 39 mL/min (A) (by C-G formula based on SCr of 1.67 mg/dL (H)). Liver Function Tests:  Recent Labs Lab 08/14/16 0246  AST 60*  ALT 31  ALKPHOS 47  BILITOT 0.9  PROT 5.5*  ALBUMIN 3.3*   No results for input(s): LIPASE, AMYLASE in the last 168 hours. No results for input(s): AMMONIA in the last 168 hours. Coagulation Profile:  Recent Labs Lab 08/13/16 1236 08/14/16 0246 08/15/16 0417  INR 1.52 1.65 2.13   Cardiac Enzymes:  Recent Labs Lab 08/13/16 1608 08/13/16 2124 08/14/16 0246  TROPONINI 0.06* 0.06* 0.06*   BNP (last 3 results) No results for input(s): PROBNP in the last 8760 hours. HbA1C: No results for input(s): HGBA1C in the last 72 hours. CBG: No results for input(s): GLUCAP in the last 168 hours. Lipid Profile: No results for input(s): CHOL, HDL, LDLCALC, TRIG, CHOLHDL, LDLDIRECT in the last 72 hours. Thyroid Function Tests: No results for input(s): TSH, T4TOTAL, FREET4, T3FREE, THYROIDAB in the last 72 hours. Anemia Panel: No results for input(s): VITAMINB12, FOLATE, FERRITIN, TIBC, IRON, RETICCTPCT in the last 72 hours. Sepsis Labs:  Recent Labs Lab 08/13/16 1247 08/13/16 1703  LATICACIDVEN 1.23 1.60    Recent Results (from the past 240 hour(s))  Blood Culture (routine x 2)     Status: None (Preliminary result)   Collection Time: 08/13/16  1:25 PM  Result Value Ref Range Status   Specimen Description BLOOD LEFT ANTECUBITAL  Final   Special Requests BOTTLES DRAWN AEROBIC ONLY 8CC  Final   Culture NO GROWTH 1 DAY  Final   Report Status PENDING   Incomplete  Blood Culture (routine x 2)     Status: None (Preliminary result)   Collection Time: 08/13/16  1:30 PM  Result Value Ref Range Status   Specimen Description BLOOD RIGHT HAND  Final   Special Requests BOTTLES DRAWN AEROBIC AND ANAEROBIC 10CC  Final   Culture NO GROWTH 1 DAY  Final   Report Status PENDING  Incomplete  Respiratory Panel by PCR     Status: Abnormal   Collection Time: 08/13/16  3:41 PM  Result Value Ref Range Status   Adenovirus NOT DETECTED NOT DETECTED Final   Coronavirus 229E NOT DETECTED NOT DETECTED Final   Coronavirus HKU1 NOT DETECTED NOT DETECTED Final   Coronavirus NL63 NOT DETECTED NOT DETECTED Final   Coronavirus OC43 NOT DETECTED NOT DETECTED Final   Metapneumovirus DETECTED (A) NOT DETECTED Final   Rhinovirus / Enterovirus NOT DETECTED NOT DETECTED  Final   Influenza A NOT DETECTED NOT DETECTED Final   Influenza B NOT DETECTED NOT DETECTED Final   Parainfluenza Virus 1 NOT DETECTED NOT DETECTED Final   Parainfluenza Virus 2 NOT DETECTED NOT DETECTED Final   Parainfluenza Virus 3 NOT DETECTED NOT DETECTED Final   Parainfluenza Virus 4 NOT DETECTED NOT DETECTED Final   Respiratory Syncytial Virus NOT DETECTED NOT DETECTED Final   Bordetella pertussis NOT DETECTED NOT DETECTED Final   Chlamydophila pneumoniae NOT DETECTED NOT DETECTED Final   Mycoplasma pneumoniae NOT DETECTED NOT DETECTED Final  MRSA PCR Screening     Status: None   Collection Time: 08/13/16  6:42 PM  Result Value Ref Range Status   MRSA by PCR NEGATIVE NEGATIVE Final    Comment:        The GeneXpert MRSA Assay (FDA approved for NASAL specimens only), is one component of a comprehensive MRSA colonization surveillance program. It is not intended to diagnose MRSA infection nor to guide or monitor treatment for MRSA infections.      Radiology Studies: Dg Chest Port 1 View  Result Date: 08/14/2016 CLINICAL DATA:  Acute respiratory failure with hypoxia. EXAM: PORTABLE CHEST  1 VIEW COMPARISON:  08/13/2016 FINDINGS: Cardiac pacemaker in stable position. Cardiomediastinal silhouette is stably enlarged. There is better aeration of the lungs, with residual patchy airspace consolidation in the left mid and lower lung field. Mild increase of the interstitial markings. Osseous structures are without acute abnormality. Soft tissues are grossly normal. IMPRESSION: Better aeration of the lungs consistent with improving pulmonary edema. There is however apparent patchy airspace consolidation in the left mid and lower lung field, which may represent infectious consolidation. Electronically Signed   By: Ted Mcalpine M.D.   On: 08/14/2016 08:28   Dg Chest Portable 1 View  Result Date: 08/13/2016 CLINICAL DATA:  Shortness of breath. EXAM: PORTABLE CHEST 1 VIEW COMPARISON:  10/01/2014. FINDINGS: Cardiac pacer noted with lead tip projected right ventricle. Cardiomegaly with pulmonary vascular prominence and bilateral interstitial prominence consistent CHF. Small bilateral pleural effusions. IMPRESSION: Congestive heart failure with bilateral pulmonary interstitial edema . Electronically Signed   By: Maisie Fus  Register   On: 08/13/2016 12:31    Scheduled Meds: . allopurinol  300 mg Oral Daily  . azithromycin  500 mg Intravenous Q24H  . carvedilol  3.125 mg Oral BID  . cefTRIAXone (ROCEPHIN)  IV  1 g Intravenous Q24H  . furosemide  80 mg Intravenous BID  . guaiFENesin  600 mg Oral BID  . loratadine  10 mg Oral QPM  . magnesium oxide  400 mg Oral Daily  . metolazone  2.5 mg Oral Once per day on Mon Thu  . pantoprazole  40 mg Oral BID  . potassium chloride  20 mEq Oral BID  . simvastatin  20 mg Oral q1800  . sodium chloride flush  3 mL Intravenous Q12H  . Warfarin - Pharmacist Dosing Inpatient   Does not apply q1800   Continuous Infusions:   LOS: 2 days     Penny Pia [facility] 08/15/2016, 10:15 AM Pager: [phone]  If 7PM-7AM, please contact  night-coverage [email or web address] Password [facility] 08/15/2016, 10:15 AM

## 2016-08-15 NOTE — Progress Notes (Signed)
SATURATION QUALIFICATIONS: (This note is used to comply with regulatory documentation for home oxygen)  Patient Saturations on Room Air at Rest = 86% Unit RN made aware.   Please briefly explain why patient needs home oxygen: Will need to recheck oxygen sats prior to dc, will possibly need home oxygen.

## 2016-08-15 NOTE — Progress Notes (Signed)
Patient to transfer to 3E20. Report called to Tiffany,RN.

## 2016-08-15 NOTE — Evaluation (Signed)
Physical Therapy Evaluation Patient Details Name: Ruben Walker MRN: [id number] DOB: 1941-01-05 Today's Date: 08/15/2016   History of Present Illness  Pt is a 76 y/o male admitted secondary to SOB requiring BiPAP in the ED. Pt now weaned to 2L of O2. PMH including but not limited to a-fib, CHF, HTN and hx of MI in 2000 with ICD placement.   Clinical Impression  Pt presented sitting OOB in recliner chair, awake and willing to participate in therapy session. Prior to admission, pt reported that he was independent with functional mobility and ADLs. Pt currently requires min guard for transfers and ambulation. He would benefit from use of an AD for stability with ambulation, but refused using a RW during evaluation. All VSS throughout with pt on RA. Pt would continue to benefit from skilled physical therapy services at this time while admitted and after d/c to address the below listed limitations in order to improve overall safety and independence with functional mobility.      Follow Up Recommendations Home health PT    Equipment Recommendations  Rolling walker with 5" wheels    Recommendations for Other Services       Precautions / Restrictions Precautions Precautions: Fall Restrictions Weight Bearing Restrictions: No      Mobility  Bed Mobility               General bed mobility comments: pt sitting OOB in recliner chair when therapist entered  Transfers Overall transfer level: Needs assistance Equipment used: None Transfers: Sit to/from Stand Sit to Stand: Min guard         General transfer comment: increased time, mild instability but no LOB or need for physical assistance, min guard for safety  Ambulation/Gait Ambulation/Gait assistance: Min guard Ambulation Distance (Feet): 20 Feet Assistive device: None Gait Pattern/deviations: Step-through pattern;Decreased stride length;Shuffle Gait velocity: decreased Gait velocity interpretation: Below normal speed  for age/gender General Gait Details: modest instability but no LOB or need for physical assistance. pt refusing to use any AD but reaching for IV pole for stability.   Stairs            Wheelchair Mobility    Modified Rankin (Stroke Patients Only)       Balance Overall balance assessment: Needs assistance Sitting-balance support: Feet supported Sitting balance-Leahy Scale: Fair     Standing balance support: During functional activity;Single extremity supported Standing balance-Leahy Scale: Poor Standing balance comment: reliant on at least one UE support                             Pertinent Vitals/Pain Pain Assessment: No/denies pain    Home Living Family/patient expects to be discharged to:: Private residence Living Arrangements: Alone Available Help at Discharge: Friend(s);Available PRN/intermittently Type of Home: Mobile home Home Access: Level entry     Home Layout: One level Home Equipment: None      Prior Function Level of Independence: Independent               Hand Dominance        Extremity/Trunk Assessment   Upper Extremity Assessment Upper Extremity Assessment: Overall WFL for tasks assessed    Lower Extremity Assessment Lower Extremity Assessment: Generalized weakness       Communication   Communication: No difficulties  Cognition Arousal/Alertness: Awake/alert Behavior During Therapy: WFL for tasks assessed/performed Overall Cognitive Status: Within Functional Limits for tasks assessed  General Comments      Exercises     Assessment/Plan    PT Assessment Patient needs continued PT services  PT Problem List Decreased strength;Decreased activity tolerance;Decreased balance;Decreased mobility;Decreased coordination;Decreased knowledge of use of DME;Decreased safety awareness;Cardiopulmonary status limiting activity       PT Treatment Interventions DME  instruction;Gait training;Functional mobility training;Therapeutic activities;Therapeutic exercise;Balance training;Neuromuscular re-education;Patient/family education    PT Goals (Current goals can be found in the Care Plan section)  Acute Rehab PT Goals Patient Stated Goal: return home PT Goal Formulation: With patient Time For Goal Achievement: 08/29/16 Potential to Achieve Goals: Good    Frequency Min 3X/week   Barriers to discharge Decreased caregiver support      Co-evaluation               End of Session Equipment Utilized During Treatment: Gait belt Activity Tolerance: Patient tolerated treatment well Patient left: in chair;with call bell/phone within reach;Other (comment) (in w/c ready to transport to 3E) Nurse Communication: Mobility status PT Visit Diagnosis: Unsteadiness on feet (R26.81);Other abnormalities of gait and mobility (R26.89)    Time: 1027-2536 PT Time Calculation (min) (ACUTE ONLY): 32 min   Charges:   PT Evaluation $PT Eval Moderate Complexity: 1 Procedure PT Treatments $Gait Training: 8-22 mins   PT G Codes:        [place], PT, DPT [phone]   Alessandra Bevels Clytee Heinrich 08/15/2016, 5:16 PM

## 2016-08-16 LAB — BASIC METABOLIC PANEL
ANION GAP: 8 (ref 5–15)
BUN: 36 mg/dL — ABNORMAL HIGH (ref 6–20)
CO2: 29 mmol/L (ref 22–32)
Calcium: 9.3 mg/dL (ref 8.9–10.3)
Chloride: 100 mmol/L — ABNORMAL LOW (ref 101–111)
Creatinine, Ser: 1.53 mg/dL — ABNORMAL HIGH (ref 0.61–1.24)
GFR calc Af Amer: 50 mL/min — ABNORMAL LOW (ref >60)
GFR, EST NON AFRICAN AMERICAN: 43 mL/min — AB (ref >60)
GLUCOSE: 104 mg/dL — AB (ref 65–99)
POTASSIUM: 4 mmol/L (ref 3.5–5.1)
Sodium: 137 mmol/L (ref 135–145)

## 2016-08-16 LAB — PROTIME-INR
INR: 2.47
Prothrombin Time: 27.2 seconds — ABNORMAL HIGH (ref 11.4–15.2)

## 2016-08-16 MED ORDER — WARFARIN SODIUM 2.5 MG PO TABS
2.5000 mg | ORAL_TABLET | Freq: Once | ORAL | Status: AC
  Administered 2016-08-16: 2.5 mg via ORAL
  Filled 2016-08-16: qty 1

## 2016-08-16 NOTE — Progress Notes (Signed)
PROGRESS NOTE    Ruben Walker  GAC:[id number] DOB: [birth year] DOA: 08/13/2016 PCP: [name], MD   Brief Narrative: Ruben Walker is a 76 y.o. male with a history of complete heart block status post ablation with subsequent cardiac arrest in 2000 status post ICD placement, chronic atrial fibrillation, C KD, CHF with an EF of 45%, GERD, hyperlipidemia, hypertension, anemia, OSA. He presented with acute respiratory failure secondary to a combination of acute CHF and pneumonia.   Assessment & Plan:   Active Problems:   Essential hypertension   Permanent atrial fibrillation (HCC)   Long term current use of anticoagulant therapy   Complete heart block  s/p AV ablation   ICD (implantable cardioverter-defibrillator) in place   Chronic renal insufficiency, stage III (moderate)   Acute respiratory failure with hypoxemia (HCC)   CAP (community acquired pneumonia)   AKI (acute kidney injury) (HCC)   Acute on chronic diastolic heart failure (HCC)   Pressure injury of skin   Acute respiratory failure Combination of acute CHF and community acquired pneumonia. Off bipap. Normal mental status - continue to try and wean to room air as tolerated - continue to treat CHF and pneumonia, improving on current medication regimen Lasix 80 mg IV twice a day and azithromycin and ceftriaxone  Acute on chronic systolic CHF Last EF actually improved to 55-60% on last echo on 11/23/2015. Follows with Dr. Duke Salvia as an outpatient. AICD -lasix IV, hold demadex -I/O, currently net 5.7 L negative -daily weights -echocardiogram completed in reporting EF 55-60% with no wall motion abnormality and inability to assess diastolic function  Hypokalemia - resolved after replacement  Community acquired pneumonia Febrile overnight. On bipap -continue Ceftriaxone and azithromycin -sputum gram stain pending -sputum culture pending -urine strep/legionella, negative  Acute kidney injury on CKD stage  3 Baseline of 1.4. 1.84 on admission. Likely secondary to cardiorenal syndrome -diuresis - Nearing baseline currently  Complete heart block S/p ablation. AICD  Atrial fibrillation Cardiac arrest AICD in place -continue Coumadin -telemetry  Essential hypertension -continue to hold losartan secondary to AKI -continue coreg  GERD -continue PPI   DVT prophylaxis: Warfarin Code Status: Full code Family Communication: None at bedside Disposition Plan: Discharge home pending improvement of respiratory status   Consultants:   None  Procedures:   Antimicrobials:  Ceftriaxone  Azithromycin    Subjective: Patient still reporting a lot of sputum production  Objective: Vitals:   08/15/16 1649 08/15/16 2138 08/15/16 2347 08/16/16 0500  BP: (!) 126/50 (!) 107/45 128/73 111/89  Pulse: 70 70 70 71  Resp: 20 18 20 20   Temp: 98 F (36.7 C) 98.2 F (36.8 C) 98.1 F (36.7 C) 98 F (36.7 C)  TempSrc: Oral Oral Oral Oral  SpO2: 96% 99% 98% 96%  Weight: 93.8 kg (206 lb 12.8 oz)   93.5 kg (206 lb 2.1 oz)  Height: 5\' 2"  (1.575 m)       Intake/Output Summary (Last 24 hours) at 08/16/16 1204 Last data filed at 08/16/16 1059  Gross per 24 hour  Intake              730 ml  Output             2670 ml  Net            -1940 ml   Filed Weights   08/15/16 0500 08/15/16 1649 08/16/16 0500  Weight: 95.7 kg (211 lb) 93.8 kg (206 lb 12.8 oz) 93.5 kg (206 lb 2.1 oz)  Examination:  General exam: Appears calm and comfortable, in nad.  Respiratory system: Diminished breath sounds. Respiratory effort normal. Equal chest rise. Cardiovascular system: S1 & S2 heard, RRR. No murmurs. Gastrointestinal system: Abdomen is nondistended, soft and nontender. Normal bowel sounds heard. Central nervous system: Alert and oriented. No focal neurological deficits. Extremities: No edema. No calf tenderness Skin: No cyanosis. Warm, dry Psychiatry: Judgement and insight appear normal. Mood &  affect appropriate.   Data Reviewed: I have personally reviewed following labs and imaging studies  CBC:  Recent Labs Lab 08/13/16 1236 08/14/16 0246  WBC 8.2 8.1  NEUTROABS 6.2  --   HGB 10.9* 10.3*  HCT 33.6* 31.3*  MCV 93.1 92.9  PLT 132* 132*   Basic Metabolic Panel:  Recent Labs Lab 08/13/16 1236 08/14/16 0246 08/15/16 0417 08/16/16 0456  NA 134* 136 137 137  K 4.4 3.6 3.4* 4.0  CL 96* 97* 98* 100*  CO2 27 25 28 29   GLUCOSE 110* 92 97 104*  BUN 39* 38* 38* 36*  CREATININE 1.80* 1.73* 1.67* 1.53*  CALCIUM 9.1 8.6* 9.0 9.3  MG  --  2.0  --   --    GFR: Estimated Creatinine Clearance: 42.1 mL/min (A) (by C-G formula based on SCr of 1.53 mg/dL (H)). Liver Function Tests:  Recent Labs Lab 08/14/16 0246  AST 60*  ALT 31  ALKPHOS 47  BILITOT 0.9  PROT 5.5*  ALBUMIN 3.3*   No results for input(s): LIPASE, AMYLASE in the last 168 hours. No results for input(s): AMMONIA in the last 168 hours. Coagulation Profile:  Recent Labs Lab 08/13/16 1236 08/14/16 0246 08/15/16 0417 08/16/16 0456  INR 1.52 1.65 2.13 2.47   Cardiac Enzymes:  Recent Labs Lab 08/13/16 1608 08/13/16 2124 08/14/16 0246  TROPONINI 0.06* 0.06* 0.06*   BNP (last 3 results) No results for input(s): PROBNP in the last 8760 hours. HbA1C: No results for input(s): HGBA1C in the last 72 hours. CBG: No results for input(s): GLUCAP in the last 168 hours. Lipid Profile: No results for input(s): CHOL, HDL, LDLCALC, TRIG, CHOLHDL, LDLDIRECT in the last 72 hours. Thyroid Function Tests: No results for input(s): TSH, T4TOTAL, FREET4, T3FREE, THYROIDAB in the last 72 hours. Anemia Panel: No results for input(s): VITAMINB12, FOLATE, FERRITIN, TIBC, IRON, RETICCTPCT in the last 72 hours. Sepsis Labs:  Recent Labs Lab 08/13/16 1247 08/13/16 1703  LATICACIDVEN 1.23 1.60    Recent Results (from the past 240 hour(s))  Blood Culture (routine x 2)     Status: None (Preliminary result)    Collection Time: 08/13/16  1:25 PM  Result Value Ref Range Status   Specimen Description BLOOD LEFT ANTECUBITAL  Final   Special Requests BOTTLES DRAWN AEROBIC ONLY 8CC  Final   Culture NO GROWTH 2 DAYS  Final   Report Status PENDING  Incomplete  Blood Culture (routine x 2)     Status: None (Preliminary result)   Collection Time: 08/13/16  1:30 PM  Result Value Ref Range Status   Specimen Description BLOOD RIGHT HAND  Final   Special Requests BOTTLES DRAWN AEROBIC AND ANAEROBIC 10CC  Final   Culture NO GROWTH 2 DAYS  Final   Report Status PENDING  Incomplete  Respiratory Panel by PCR     Status: Abnormal   Collection Time: 08/13/16  3:41 PM  Result Value Ref Range Status   Adenovirus NOT DETECTED NOT DETECTED Final   Coronavirus 229E NOT DETECTED NOT DETECTED Final   Coronavirus HKU1 NOT DETECTED  NOT DETECTED Final   Coronavirus NL63 NOT DETECTED NOT DETECTED Final   Coronavirus OC43 NOT DETECTED NOT DETECTED Final   Metapneumovirus DETECTED (A) NOT DETECTED Final   Rhinovirus / Enterovirus NOT DETECTED NOT DETECTED Final   Influenza A NOT DETECTED NOT DETECTED Final   Influenza B NOT DETECTED NOT DETECTED Final   Parainfluenza Virus 1 NOT DETECTED NOT DETECTED Final   Parainfluenza Virus 2 NOT DETECTED NOT DETECTED Final   Parainfluenza Virus 3 NOT DETECTED NOT DETECTED Final   Parainfluenza Virus 4 NOT DETECTED NOT DETECTED Final   Respiratory Syncytial Virus NOT DETECTED NOT DETECTED Final   Bordetella pertussis NOT DETECTED NOT DETECTED Final   Chlamydophila pneumoniae NOT DETECTED NOT DETECTED Final   Mycoplasma pneumoniae NOT DETECTED NOT DETECTED Final  MRSA PCR Screening     Status: None   Collection Time: 08/13/16  6:42 PM  Result Value Ref Range Status   MRSA by PCR NEGATIVE NEGATIVE Final    Comment:        The GeneXpert MRSA Assay (FDA approved for NASAL specimens only), is one component of a comprehensive MRSA colonization surveillance program. It is  not intended to diagnose MRSA infection nor to guide or monitor treatment for MRSA infections.      Radiology Studies: No results found.  Scheduled Meds: . allopurinol  300 mg Oral Daily  . azithromycin  500 mg Intravenous Q24H  . carvedilol  3.125 mg Oral BID  . cefTRIAXone (ROCEPHIN)  IV  1 g Intravenous Q24H  . furosemide  80 mg Intravenous BID  . guaiFENesin  600 mg Oral BID  . loratadine  10 mg Oral QPM  . magnesium oxide  400 mg Oral Daily  . metolazone  2.5 mg Oral Once per day on Mon Thu  . pantoprazole  40 mg Oral BID  . potassium chloride  20 mEq Oral BID  . simvastatin  20 mg Oral q1800  . sodium chloride flush  3 mL Intravenous Q12H  . Warfarin - Pharmacist Dosing Inpatient   Does not apply q1800   Continuous Infusions:   LOS: 3 days     Penny Pia [facility] 08/16/2016, 12:04 PM Pager: [phone]  If 7PM-7AM, please contact night-coverage [email or web address] Password [facility] 08/16/2016, 12:04 PM

## 2016-08-16 NOTE — Progress Notes (Signed)
qPhysical Therapy Treatment Patient Details Name: Ruben Walker MRN: [id number] DOB: 14-Sep-1940 Today's Date: 08/16/2016    History of Present Illness Pt is a 76 y/o male admitted secondary to SOB requiring BiPAP in the ED. Pt now weaned to 2L of O2. PMH including but not limited to a-fib, CHF, HTN and hx of MI in 2000 with ICD placement.     PT Comments    Pt tolerated increased activity level today, he ambulated 200' holding onto handrail in hall. SaO2 89-93% on RA with ambulation, HR 70s. Pt adamantly refuses use of a walker or cane.   Follow Up Recommendations  Home health PT     Equipment Recommendations  Rolling walker with 5" wheels    Recommendations for Other Services       Precautions / Restrictions Precautions Precautions: Fall Restrictions Weight Bearing Restrictions: No    Mobility  Bed Mobility               General bed mobility comments: pt sitting up on edge of bed  Transfers Overall transfer level: Needs assistance Equipment used: None Transfers: Sit to/from Stand Sit to Stand: Min guard         General transfer comment: increased time, mild instability but no LOB or need for physical assistance, min guard for safety  Ambulation/Gait Ambulation/Gait assistance: Min guard Ambulation Distance (Feet): 200 Feet Assistive device: None (pt frequently reached for handrail in hall, refused AD) Gait Pattern/deviations: Step-through pattern;Decreased stride length;Shuffle Gait velocity: decreased   General Gait Details: modest instability x 2 but pt able to self correct. pt refusing to use any AD but reaching for handrail for stability. Several brief standing rest breaks 2* SOB.  SaO2 89-93% on RA walking, HR 70s   Stairs            Wheelchair Mobility    Modified Rankin (Stroke Patients Only)       Balance Overall balance assessment: Needs assistance Sitting-balance support: Feet supported Sitting balance-Leahy Scale: Fair      Standing balance support: During functional activity;Single extremity supported Standing balance-Leahy Scale: Poor Standing balance comment: reliant on at least one UE support                            Cognition Arousal/Alertness: Awake/alert Behavior During Therapy: WFL for tasks assessed/performed Overall Cognitive Status: Within Functional Limits for tasks assessed                                        Exercises      General Comments        Pertinent Vitals/Pain Pain Assessment: No/denies pain    Home Living                      Prior Function            PT Goals (current goals can now be found in the care plan section) Acute Rehab PT Goals Patient Stated Goal: return home PT Goal Formulation: With patient Time For Goal Achievement: 08/29/16 Potential to Achieve Goals: Good Progress towards PT goals: Progressing toward goals    Frequency    Min 3X/week      PT Plan Current plan remains appropriate    Co-evaluation             End of Session Equipment  Utilized During Treatment: Gait belt Activity Tolerance: Patient tolerated treatment well Patient left: with call bell/phone within reach;in bed Nurse Communication: Mobility status PT Visit Diagnosis: Unsteadiness on feet (R26.81);Other abnormalities of gait and mobility (R26.89)     Time: 1610-9604 PT Time Calculation (min) (ACUTE ONLY): 34 min  Charges:  $Gait Training: 8-22 mins $Therapeutic Activity: 8-22 mins                    G Codes:          Tamala Ser 08/16/2016, 1:38 PM [phone]

## 2016-08-16 NOTE — Progress Notes (Signed)
Progress Note  Patient Name: Ruben Walker Date of Encounter: 08/16/2016  Primary Cardiologist:  Dr. Duke Salvia Dr. Graciela Husbands (EP)  Patient Profile     76 y.o. male w/ hx HTN, perm Afib s/p AV node ablation, on coumadin, ICD after cardiac arrest w/ no sig CAD, s/p gen change 05/2016, OSA, D-CHF w/ EF 55% 11/2015 echo, CKD III, obesity. Admit 03/26 w/ fever/cough, cards saw 03/28 for CHF.  Subjective   Coughing up more yellowish, foamy sputum. Still SOB at rest but feels is breathing a little better.   Inpatient Medications    Scheduled Meds: . allopurinol  300 mg Oral Daily  . azithromycin  500 mg Intravenous Q24H  . carvedilol  3.125 mg Oral BID  . cefTRIAXone (ROCEPHIN)  IV  1 g Intravenous Q24H  . furosemide  80 mg Intravenous BID  . guaiFENesin  600 mg Oral BID  . loratadine  10 mg Oral QPM  . magnesium oxide  400 mg Oral Daily  . metolazone  2.5 mg Oral Once per day on Mon Thu  . pantoprazole  40 mg Oral BID  . potassium chloride  20 mEq Oral BID  . simvastatin  20 mg Oral q1800  . sodium chloride flush  3 mL Intravenous Q12H  . Warfarin - Pharmacist Dosing Inpatient   Does not apply q1800   Continuous Infusions:  PRN Meds: sodium chloride, acetaminophen **OR** acetaminophen, albuterol, hydrALAZINE, promethazine, sodium chloride flush   Vital Signs    Vitals:   08/15/16 1649 08/15/16 2138 08/15/16 2347 08/16/16 0500  BP: (!) 126/50 (!) 107/45 128/73 111/89  Pulse: 70 70 70 71  Resp: 20 18 20 20   Temp: 98 F (36.7 C) 98.2 F (36.8 C) 98.1 F (36.7 C) 98 F (36.7 C)  TempSrc: Oral Oral Oral Oral  SpO2: 96% 99% 98% 96%  Weight: 206 lb 12.8 oz (93.8 kg)   206 lb 2.1 oz (93.5 kg)  Height: 5\' 2"  (1.575 m)       Intake/Output Summary (Last 24 hours) at 08/16/16 0916 Last data filed at 08/16/16 0600  Gross per 24 hour  Intake              250 ml  Output             2220 ml  Net            -1970 ml   Filed Weights   08/15/16 0500 08/15/16 1649 08/16/16 0500   Weight: 211 lb (95.7 kg) 206 lb 12.8 oz (93.8 kg) 206 lb 2.1 oz (93.5 kg)    Telemetry    Afib, V pacing - Personally Reviewed  ECG    n/a - Personally Reviewed  Physical Exam   General: Well developed, well nourished, male appearing in no acute distress. Head: Normocephalic, atraumatic.  Neck: Supple without bruits, JVD at 8-9 cm. Lungs:  Resp regular and mod labored, +wheeze, +rales, +rhonchi Heart: RRR, S1, S2, no S3, S4, soft murmur; no rub. Abdomen: Soft, non-tender, non-distended with normoactive bowel sounds. No hepatomegaly. No rebound/guarding. No obvious abdominal masses. Extremities: No clubbing, cyanosis, no sig edema. Distal pedal pulses are 2+ bilaterally. Neuro: Alert and oriented X 3. Moves all extremities spontaneously. Psych: Normal affect.  Labs    Hematology  Recent Labs Lab 08/13/16 1236 08/14/16 0246  WBC 8.2 8.1  RBC 3.61* 3.37*  HGB 10.9* 10.3*  HCT 33.6* 31.3*  MCV 93.1 92.9  MCH 30.2 30.6  MCHC 32.4 32.9  RDW 14.2 14.6  PLT 132* 132*    Chemistry  Recent Labs Lab 08/14/16 0246 08/15/16 0417 08/16/16 0456  NA 136 137 137  K 3.6 3.4* 4.0  CL 97* 98* 100*  CO2 25 28 29   GLUCOSE 92 97 104*  BUN 38* 38* 36*  CREATININE 1.73* 1.67* 1.53*  CALCIUM 8.6* 9.0 9.3  PROT 5.5*  --   --   ALBUMIN 3.3*  --   --   AST 60*  --   --   ALT 31  --   --   ALKPHOS 47  --   --   BILITOT 0.9  --   --   GFRNONAA 37* 39* 43*  GFRAA 43* 45* 50*  ANIONGAP 14 11 8      Cardiac Enzymes  Recent Labs Lab 08/13/16 1608 08/13/16 2124 08/14/16 0246  TROPONINI 0.06* 0.06* 0.06*     Recent Labs Lab 08/13/16 1700  TROPIPOC 0.05     BNP  Recent Labs Lab 08/13/16 1236  BNP 699.5*    Lab Results  Component Value Date   INR 2.47 08/16/2016   INR 2.13 08/15/2016   INR 1.65 08/14/2016    Radiology    Dg Chest Port 1 View Result Date: 08/14/2016 CLINICAL DATA:  Acute respiratory failure with hypoxia. EXAM: PORTABLE CHEST 1 VIEW  COMPARISON:  08/13/2016 FINDINGS: Cardiac pacemaker in stable position. Cardiomediastinal silhouette is stably enlarged. There is better aeration of the lungs, with residual patchy airspace consolidation in the left mid and lower lung field. Mild increase of the interstitial markings. Osseous structures are without acute abnormality. Soft tissues are grossly normal. IMPRESSION: Better aeration of the lungs consistent with improving pulmonary edema. There is however apparent patchy airspace consolidation in the left mid and lower lung field, which may represent infectious consolidation. Electronically Signed   By: Ted Mcalpine M.D.   On: 08/14/2016 08:28   Dg Chest Portable 1 View Result Date: 08/13/2016 CLINICAL DATA:  Shortness of breath. EXAM: PORTABLE CHEST 1 VIEW COMPARISON:  10/01/2014. FINDINGS: Cardiac pacer noted with lead tip projected right ventricle. Cardiomegaly with pulmonary vascular prominence and bilateral interstitial prominence consistent CHF. Small bilateral pleural effusions. IMPRESSION: Congestive heart failure with bilateral pulmonary interstitial edema . Electronically Signed   By: Maisie Fus  Register   On: 08/13/2016 12:31     Cardiac Studies   ECHO: 08/14/2016 - Left ventricle: The cavity size was normal. Wall thickness was   normal. Systolic function was normal. The estimated ejection   fraction was in the range of 55% to 60%. Wall motion was normal;   there were no regional wall motion abnormalities. The study is   not technically sufficient to allow evaluation of LV diastolic function. - Mitral valve: Calcified annulus. There was mild regurgitation. - Left atrium: The atrium was severely dilated. - Right atrium: The atrium was moderately dilated. - Tricuspid valve: There was moderate regurgitation. - Pulmonic valve: Peak gradient (S): 37 mm Hg. - Pulmonary arteries: Systolic pressure was moderately increased. Impressions: - Technically difficult; definity used;  normal LV systolic   function; biatrial enlargement; mild MR; moderate TR with   moderately elevated pulmonary pressure.  Patient Profile     76 y.o. male w/ hx HTN, perm Afib s/p AV node ablation, on coumadin, ICD after cardiac arrest w/ no sig CAD, s/p gen change 05/2016, OSA, D-CHF w/ EF 55% 11/2015 echo, CKD III, obesity. Admit 03/26 w/ fever/cough, cards saw 03/28 for CHF.  Assessment & Plan  Acute respiratory failure 2/2 acute CHF and CAP: no longer on BIPAP.  - however, pt actively wheezing and O2 sats drop when he removes O2, was not on PTA - consider steroid taper  Acute on chronic diastolic CHF: in the setting of acute PNA.  - ECHO this admission with EF 55-60%.  - Currently on lasix 80mg  BID. Net neg 5.8L.  - Weight 211>>206 lbs, wt was 204  when he saw Dr. Duke Salvia.  - Creat improving with diuresis 1.8--> 1.73>>1.67>>1.53. Would continue this current dose - exam is improving, possible change to po Lasix in am  Elevated troponin: low and flat trend: 0.6--> 0.6--> 0.06 c/w demand ischemia in the setting of acute CHF and PNA, no ischemic eval planned  AKI: creat improving with diuresis. 1.8--> 1.73-->1.67-->1.53  HTN: BP well controlled.   Chronic atrial fibrillation: s/p AV ablation, on coumadin, CHADSVASC=4 (HTN, CHF, CAD, age x 1).   HLD: continue statin   ICD: with recent gen change in 05/2016. Followed by Dr. Graciela Husbands   Active Problems:   Essential hypertension   Permanent atrial fibrillation (HCC)   Long term current use of anticoagulant therapy   Complete heart block  s/p AV ablation   ICD (implantable cardioverter-defibrillator) in place   Chronic renal insufficiency, stage III (moderate)   Acute respiratory failure with hypoxemia (HCC)   CAP (community acquired pneumonia)   AKI (acute kidney injury) (HCC)   Acute on chronic diastolic heart failure (HCC)   Pressure injury of skin    Signed, Theodore Demark , PA-C 9:16 AM 08/16/2016 Pager:  [phone]  Patient seen, examined. Available data reviewed. Agree with findings, assessment, and plan as outlined by Theodore Demark, PA-C. The patient is alert and oriented in no distress. He is breathing comfortably on oxygen per nasal cannula. There is audible wheezing. Heart is regular rate and rhythm without murmur. Abdomen is soft and obese. Extremities are without edema. I agree with the assessment and plan as detailed above. This patient is improving from a cardiac perspective with respect to his acute on chronic diastolic heart failure. He continues to diurese well and his creatinine is trending down. It is reasonable to continue IV Lasix today but I would anticipate switching him to oral Lasix tomorrow as I think his volume status is much better. I think his primary problem at this point is community-acquired pneumonia. The patient's INR is therapeutic as he is treated with warfarin for permanent atrial fibrillation.  Tonny Bollman, M.D. 08/16/2016 10:58 AM

## 2016-08-16 NOTE — Progress Notes (Signed)
Patient stable from 7p-11p, patient ambulated in room and tolerated it well. New nurse will be with him 11p-7a.

## 2016-08-16 NOTE — Progress Notes (Signed)
Pt slept well during the night, Vitals stable, no any sign of SOB and distress noted, no any complain of pain, oxygen continue via [place] @2l /min, will continue to monitor the patient.

## 2016-08-16 NOTE — Progress Notes (Signed)
SATURATION QUALIFICATIONS: (This note is used to comply with regulatory documentation for home oxygen)  Patient Saturations on Room Air at Rest = 97%  Patient Saturations on Room Air while Ambulating =89-93%  Patient Saturations on 2 Liters of oxygen while Ambulating =99 %  Ralene Bathe Kistler PT 08/16/2016  [phone]

## 2016-08-16 NOTE — Progress Notes (Signed)
ANTICOAGULATION CONSULT NOTE - Follow Up Consult  Pharmacy Consult for Coumadin Indication: atrial fibrillation  No Known Allergies  Patient Measurements: Height: 5\' 2"  (157.5 cm) Weight: 206 lb 2.1 oz (93.5 kg) IBW/kg (Calculated) : 54.6  Vital Signs: Temp: 98 F (36.7 C) (03/29 1224) Temp Source: Oral (03/29 1224) BP: 138/62 (03/29 1224) Pulse Rate: 70 (03/29 1224)  Labs:  Recent Labs  08/13/16 1608 08/13/16 2124 08/14/16 0246 08/15/16 0417 08/16/16 0456  HGB  --   --  10.3*  --   --   HCT  --   --  31.3*  --   --   PLT  --   --  132*  --   --   LABPROT  --   --  19.7* 24.1* 27.2*  INR  --   --  1.65 2.13 2.47  CREATININE  --   --  1.73* 1.67* 1.53*  TROPONINI 0.06* 0.06* 0.06*  --   --     Estimated Creatinine Clearance: 42.1 mL/min (A) (by C-G formula based on SCr of 1.53 mg/dL (H)).  Assessment:  76 yo male on coumadin for atrial fibrillation. INR on admission 1.52 with last dose taken on 3/25. CBC stable. Prior to admission dose is 5 mg on Tuesday and Thursday and 2.5 mg all other days (dose decreased 4 weeks ago).    INR is therapeutic; trended up to 2.47 after Coumadin 5 mg x 2, then 2.5 mg yesterday.  Goal of Therapy:  INR 2-3 Monitor platelets by anticoagulation protocol: Yes   Plan:   Coumadin 2.5 mg again today.  Daily PT/INR for now.  Q 3 day CBC in am.  Dennie Fetters, RPh Pager: [phone] 08/16/2016,2:51 PM

## 2016-08-17 LAB — BASIC METABOLIC PANEL
ANION GAP: 12 (ref 5–15)
BUN: 34 mg/dL — AB (ref 6–20)
CO2: 31 mmol/L (ref 22–32)
Calcium: 10 mg/dL (ref 8.9–10.3)
Chloride: 94 mmol/L — ABNORMAL LOW (ref 101–111)
Creatinine, Ser: 1.38 mg/dL — ABNORMAL HIGH (ref 0.61–1.24)
GFR calc Af Amer: 57 mL/min — ABNORMAL LOW (ref >60)
GFR calc non Af Amer: 49 mL/min — ABNORMAL LOW (ref >60)
GLUCOSE: 90 mg/dL (ref 65–99)
POTASSIUM: 3.6 mmol/L (ref 3.5–5.1)
Sodium: 137 mmol/L (ref 135–145)

## 2016-08-17 LAB — EXPECTORATED SPUTUM ASSESSMENT W GRAM STAIN, RFLX TO RESP C

## 2016-08-17 LAB — EXPECTORATED SPUTUM ASSESSMENT W REFEX TO RESP CULTURE

## 2016-08-17 LAB — PROTIME-INR
INR: 1.95
Prothrombin Time: 22.5 seconds — ABNORMAL HIGH (ref 11.4–15.2)

## 2016-08-17 LAB — CBC
HCT: 33.8 % — ABNORMAL LOW (ref 39.0–52.0)
Hemoglobin: 10.9 g/dL — ABNORMAL LOW (ref 13.0–17.0)
MCH: 29.8 pg (ref 26.0–34.0)
MCHC: 32.2 g/dL (ref 30.0–36.0)
MCV: 92.3 fL (ref 78.0–100.0)
PLATELETS: 170 10*3/uL (ref 150–400)
RBC: 3.66 MIL/uL — AB (ref 4.22–5.81)
RDW: 14 % (ref 11.5–15.5)
WBC: 6.4 10*3/uL (ref 4.0–10.5)

## 2016-08-17 MED ORDER — TORSEMIDE 20 MG PO TABS: 40.0000 mg | ORAL_TABLET | Freq: Two times a day (BID) | ORAL | Status: DC

## 2016-08-17 MED ORDER — CEFPODOXIME PROXETIL 200 MG PO TABS
200.0000 mg | ORAL_TABLET | Freq: Two times a day (BID) | ORAL | Status: DC
  Administered 2016-08-17 – 2016-08-18 (×4): 200 mg via ORAL
  Filled 2016-08-17 (×5): qty 1

## 2016-08-17 MED ORDER — AZITHROMYCIN 500 MG PO TABS
500.0000 mg | ORAL_TABLET | Freq: Every day | ORAL | Status: DC
  Administered 2016-08-17 – 2016-08-18 (×2): 500 mg via ORAL
  Filled 2016-08-17 (×2): qty 1

## 2016-08-17 MED ORDER — TORSEMIDE 20 MG PO TABS
60.0000 mg | ORAL_TABLET | Freq: Every day | ORAL | Status: DC
Start: 2016-08-18 — End: 2016-08-19
  Administered 2016-08-18: 60 mg via ORAL
  Filled 2016-08-17: qty 3

## 2016-08-17 MED ORDER — WARFARIN SODIUM 5 MG PO TABS
5.0000 mg | ORAL_TABLET | Freq: Once | ORAL | Status: AC
  Administered 2016-08-17: 5 mg via ORAL
  Filled 2016-08-17: qty 1

## 2016-08-17 NOTE — Progress Notes (Signed)
ANTICOAGULATION CONSULT NOTE - Follow Up Consult  Pharmacy Consult for Coumadin Indication: atrial fibrillation  No Known Allergies  Patient Measurements: Height: 5\' 2"  (157.5 cm) Weight: 201 lb 8 oz (91.4 kg) IBW/kg (Calculated) : 54.6  Vital Signs: Temp: 97.9 F (36.6 C) (03/30 0529) Temp Source: Oral (03/30 0529) BP: 113/58 (03/30 0529) Pulse Rate: 71 (03/30 0529)  Labs:  Recent Labs  08/15/16 0417 08/16/16 0456 08/17/16 0501  HGB  --   --  10.9*  HCT  --   --  33.8*  PLT  --   --  170  LABPROT 24.1* 27.2* 22.5*  INR 2.13 2.47 1.95  CREATININE 1.67* 1.53* 1.38*    Estimated Creatinine Clearance: 46 mL/min (A) (by C-G formula based on SCr of 1.38 mg/dL (H)).  Assessment:  76 yo male on coumadin for atrial fibrillation. INR on admission 1.52 with last dose taken on 3/25. CBC stable. Prior to admission dose is 5 mg on Tuesday and Thursday and 2.5 mg all other days (dose decreased 4 weeks ago).     INR is just  subtherapeutic; trended up to 2.47 after Coumadin 5 mg x 2, then 2.5 mg, now down to 1.95 after 2.5 mg yesterday.  Goal of Therapy:  INR 2-3 Monitor platelets by anticoagulation protocol: Yes   Plan:   Coumadin 5 mg today.  Daily PT/INR for now.  May go home in am. If INR at goal on 3/31, would resume home regimen.  If INR trends down in am, could repeat 5 mg on 3/31 then resume home regimen.  He already has an appointment scheduled for 08/20/16.  Dennie Fetters, [place] Pager: [phone] 08/17/2016,12:20 PM

## 2016-08-17 NOTE — Progress Notes (Signed)
qPhysical Therapy Treatment Patient Details Name: Ruben Walker MRN: [id number] DOB: 01-02-1941 Today's Date: 08/17/2016    History of Present Illness Pt is a 76 y/o male admitted secondary to SOB requiring BiPAP in the ED. Pt now weaned to 2L of O2. PMH including but not limited to a-fib, CHF, HTN and hx of MI in 2000 with ICD placement. Pt is a 76 y/o male admitted on 08/13/16 secondary to SOB requiring BiPAP in the ED. Pt dx with acute respiratory failure combined PNA and CHF, acute on chronic CHF, hypokalemia, and acute kidney injury on CKD III.  PMH including but not limited to a-fib, CHF, HTN and hx of MI, s/p ICD placement.     PT Comments    Pt is progressing with his gait and mobility. Staggering gait seems to be his baseline.  DOE 2-3/4 and O2 sats 87% at lowest during gait.  HR stable.  Pt on RA at rest was 94%.  He reported to me that in January before his ICD insertion he was walking 2 miles a day.  PT encouraged him to continue to walk and progress his activity.    Follow Up Recommendations  Home health PT     Equipment Recommendations  Rolling walker with 5" wheels    Recommendations for Other Services   NA     Precautions / Restrictions Precautions Precautions: Fall;Other (comment) Precaution Comments: monitor O2 sats    Mobility  Bed Mobility Overal bed mobility: Modified Independent             General bed mobility comments: a little extra time and effort needed to get EOB and he likes to go out of the left side of the bed.   Transfers Overall transfer level: Needs assistance Equipment used: None Transfers: Sit to/from Stand Sit to Stand: Supervision         General transfer comment: supervision for safety due to mild staggering when transitioning up  Ambulation/Gait Ambulation/Gait assistance: Min guard Ambulation Distance (Feet): 300 Feet Assistive device: None Gait Pattern/deviations: Step-through pattern;Staggering left;Staggering right;Wide  base of support Gait velocity: decreased   General Gait Details: Pt with staggering gait pattern, supervision for safety, but he reports this is his baseline, he furniture walks at home and reaches for support when needed.  He refuses to use can or a walker and reports he has not had a fall in "over 10 years" because he is careful.        Balance Overall balance assessment: Needs assistance Sitting-balance support: Feet supported;No upper extremity supported Sitting balance-Leahy Scale: Good     Standing balance support: No upper extremity supported Standing balance-Leahy Scale: Fair                              Cognition Arousal/Alertness: Awake/alert Behavior During Therapy: WFL for tasks assessed/performed Overall Cognitive Status: Within Functional Limits for tasks assessed                                           General Comments General comments (skin integrity, edema, etc.): HR stable and in the 70s throughout gait.  O2 sats lowest 87% on RA during gait with 2-3/4 DOE as we continued along.  2-3 short standing rest breaks.  O2 after gait seated in room on RA 94%.  Pertinent Vitals/Pain Pain Assessment: No/denies pain           PT Goals (current goals can now be found in the care plan section) Acute Rehab PT Goals Patient Stated Goal: return home Progress towards PT goals: Progressing toward goals    Frequency    Min 3X/week      PT Plan Current plan remains appropriate       End of Session Equipment Utilized During Treatment: Gait belt Activity Tolerance: Patient limited by fatigue;Other (comment) (limited by DOE) Patient left: in chair;with call bell/phone within reach   PT Visit Diagnosis: Unsteadiness on feet (R26.81);Other abnormalities of gait and mobility (R26.89);Other (comment) (decreased activity tolerance)     Time: 9518-8416 PT Time Calculation (min) (ACUTE ONLY): 17 min  Charges:  $Gait Training:  8-22 mins                         Mizani Dilday B. Kristyne Woodring, PT, DPT [phone]   08/17/2016, 4:32 PM

## 2016-08-17 NOTE — Discharge Instructions (Signed)

## 2016-08-17 NOTE — Progress Notes (Signed)
PROGRESS NOTE    Ruben Walker  BPZ:[id number] DOB: 1941-01-20 DOA: 08/13/2016 PCP: [name], MD   Brief Narrative: Ruben Walker is a 76 y.o. male with a history of complete heart block status post ablation with subsequent cardiac arrest in 2000 status post ICD placement, chronic atrial fibrillation, C KD, CHF with an EF of 45%, GERD, hyperlipidemia, hypertension, anemia, OSA. He presented with acute respiratory failure secondary to a combination of acute CHF and pneumonia.   Assessment & Plan: Active Problems:   Essential hypertension   Permanent atrial fibrillation (HCC)   Long term current use of anticoagulant therapy   Complete heart block  s/p AV ablation   ICD (implantable cardioverter-defibrillator) in place   Chronic renal insufficiency, stage III (moderate)   Acute respiratory failure with hypoxemia (HCC)   CAP (community acquired pneumonia)   AKI (acute kidney injury) (HCC)   Acute on chronic diastolic heart failure (HCC)   Pressure injury of skin   Acute respiratory failure Combination of acute CHF and community acquired pneumonia. Off bipap. Normal mental status - continue to try and wean to room air as tolerated  - continue to treat CHF and pneumonia, Cardiology suspecting at this point patient's respiratory problems currently are secondary to pneumonia as he has had good diuresis and is looking euvolemic.   Acute on chronic systolic CHF - Cardiology managing - trying oral lasix today.  Hypokalemia - resolved after replacement  Community acquired pneumonia Febrile overnight. On bipap -continue azithromycin and start to de escalate. Will transition to oral cephalosporin, if tolerates will d/c next am. -sputum gram stain pending -sputum culture pending -urine strep/legionella, negative  Acute kidney injury on CKD stage 3 Baseline of 1.4. 1.84 on admission. Likely secondary to cardiorenal syndrome - Diuresis - Nearing baseline  currently  Complete heart block S/p ablation. AICD  Atrial fibrillation Cardiac arrest AICD in place -continue Coumadin -telemetry  Essential hypertension -continue to hold losartan secondary to AKI -continue coreg  GERD -continue PPI   DVT prophylaxis: Warfarin Code Status: Full code Family Communication: None at bedside Disposition Plan: Discharge home most likely next am.   Consultants:   None  Procedures:   Antimicrobials:  Ceftriaxone  Azithromycin    Subjective: Patient feels better. Not at baseline. Looking forward to going home soon. He reports he does not use oxygen at home.  Objective: Vitals:   08/16/16 1224 08/16/16 2046 08/17/16 0017 08/17/16 0529  BP: 138/62 (!) 131/54 116/66 (!) 113/58  Pulse: 70 70 70 71  Resp: 20 20 20 20   Temp: 98 F (36.7 C) 97.8 F (36.6 C) 97.7 F (36.5 C) 97.9 F (36.6 C)  TempSrc: Oral Oral Oral Oral  SpO2: 100% 100% 100% 100%  Weight:    91.4 kg (201 lb 8 oz)  Height:        Intake/Output Summary (Last 24 hours) at 08/17/16 1018 Last data filed at 08/17/16 0957  Gross per 24 hour  Intake             1126 ml  Output             3170 ml  Net            -2044 ml   Filed Weights   08/15/16 1649 08/16/16 0500 08/17/16 0529  Weight: 93.8 kg (206 lb 12.8 oz) 93.5 kg (206 lb 2.1 oz) 91.4 kg (201 lb 8 oz)    Examination:  General exam: Appears calm and comfortable, in nad.  Respiratory system: Diminished breath sounds. Respiratory effort normal. Equal chest rise. Cardiovascular system: S1 & S2 heard, RRR. No murmurs. Gastrointestinal system: Abdomen is nondistended, soft and nontender. Normal bowel sounds heard. Central nervous system: Alert and oriented. No focal neurological deficits. Extremities: No edema. No calf tenderness Skin: No cyanosis. Warm, dry Psychiatry: Judgement and insight appear normal. Mood & affect appropriate.   Data Reviewed: I have personally reviewed following labs and imaging  studies  CBC:  Recent Labs Lab 08/13/16 1236 08/14/16 0246 08/17/16 0501  WBC 8.2 8.1 6.4  NEUTROABS 6.2  --   --   HGB 10.9* 10.3* 10.9*  HCT 33.6* 31.3* 33.8*  MCV 93.1 92.9 92.3  PLT 132* 132* 170   Basic Metabolic Panel:  Recent Labs Lab 08/13/16 1236 08/14/16 0246 08/15/16 0417 08/16/16 0456 08/17/16 0501  NA 134* 136 137 137 137  K 4.4 3.6 3.4* 4.0 3.6  CL 96* 97* 98* 100* 94*  CO2 27 25 28 29 31   GLUCOSE 110* 92 97 104* 90  BUN 39* 38* 38* 36* 34*  CREATININE 1.80* 1.73* 1.67* 1.53* 1.38*  CALCIUM 9.1 8.6* 9.0 9.3 10.0  MG  --  2.0  --   --   --    GFR: Estimated Creatinine Clearance: 46 mL/min (A) (by C-G formula based on SCr of 1.38 mg/dL (H)). Liver Function Tests:  Recent Labs Lab 08/14/16 0246  AST 60*  ALT 31  ALKPHOS 47  BILITOT 0.9  PROT 5.5*  ALBUMIN 3.3*   No results for input(s): LIPASE, AMYLASE in the last 168 hours. No results for input(s): AMMONIA in the last 168 hours. Coagulation Profile:  Recent Labs Lab 08/13/16 1236 08/14/16 0246 08/15/16 0417 08/16/16 0456 08/17/16 0501  INR 1.52 1.65 2.13 2.47 1.95   Cardiac Enzymes:  Recent Labs Lab 08/13/16 1608 08/13/16 2124 08/14/16 0246  TROPONINI 0.06* 0.06* 0.06*   BNP (last 3 results) No results for input(s): PROBNP in the last 8760 hours. HbA1C: No results for input(s): HGBA1C in the last 72 hours. CBG: No results for input(s): GLUCAP in the last 168 hours. Lipid Profile: No results for input(s): CHOL, HDL, LDLCALC, TRIG, CHOLHDL, LDLDIRECT in the last 72 hours. Thyroid Function Tests: No results for input(s): TSH, T4TOTAL, FREET4, T3FREE, THYROIDAB in the last 72 hours. Anemia Panel: No results for input(s): VITAMINB12, FOLATE, FERRITIN, TIBC, IRON, RETICCTPCT in the last 72 hours. Sepsis Labs:  Recent Labs Lab 08/13/16 1247 08/13/16 1703  LATICACIDVEN 1.23 1.60    Recent Results (from the past 240 hour(s))  Blood Culture (routine x 2)     Status: None  (Preliminary result)   Collection Time: 08/13/16  1:25 PM  Result Value Ref Range Status   Specimen Description BLOOD LEFT ANTECUBITAL  Final   Special Requests BOTTLES DRAWN AEROBIC ONLY 8CC  Final   Culture NO GROWTH 3 DAYS  Final   Report Status PENDING  Incomplete  Blood Culture (routine x 2)     Status: None (Preliminary result)   Collection Time: 08/13/16  1:30 PM  Result Value Ref Range Status   Specimen Description BLOOD RIGHT HAND  Final   Special Requests BOTTLES DRAWN AEROBIC AND ANAEROBIC 10CC  Final   Culture NO GROWTH 3 DAYS  Final   Report Status PENDING  Incomplete  Respiratory Panel by PCR     Status: Abnormal   Collection Time: 08/13/16  3:41 PM  Result Value Ref Range Status   Adenovirus NOT DETECTED NOT DETECTED Final  Coronavirus 229E NOT DETECTED NOT DETECTED Final   Coronavirus HKU1 NOT DETECTED NOT DETECTED Final   Coronavirus NL63 NOT DETECTED NOT DETECTED Final   Coronavirus OC43 NOT DETECTED NOT DETECTED Final   Metapneumovirus DETECTED (A) NOT DETECTED Final   Rhinovirus / Enterovirus NOT DETECTED NOT DETECTED Final   Influenza A NOT DETECTED NOT DETECTED Final   Influenza B NOT DETECTED NOT DETECTED Final   Parainfluenza Virus 1 NOT DETECTED NOT DETECTED Final   Parainfluenza Virus 2 NOT DETECTED NOT DETECTED Final   Parainfluenza Virus 3 NOT DETECTED NOT DETECTED Final   Parainfluenza Virus 4 NOT DETECTED NOT DETECTED Final   Respiratory Syncytial Virus NOT DETECTED NOT DETECTED Final   Bordetella pertussis NOT DETECTED NOT DETECTED Final   Chlamydophila pneumoniae NOT DETECTED NOT DETECTED Final   Mycoplasma pneumoniae NOT DETECTED NOT DETECTED Final  MRSA PCR Screening     Status: None   Collection Time: 08/13/16  6:42 PM  Result Value Ref Range Status   MRSA by PCR NEGATIVE NEGATIVE Final    Comment:        The GeneXpert MRSA Assay (FDA approved for NASAL specimens only), is one component of a comprehensive MRSA colonization surveillance  program. It is not intended to diagnose MRSA infection nor to guide or monitor treatment for MRSA infections.   Culture, sputum-assessment     Status: None   Collection Time: 08/17/16  8:39 AM  Result Value Ref Range Status   Specimen Description SPUTUM  Final   Special Requests NONE  Final   Sputum evaluation   Final    Sputum specimen not acceptable for testing.  Please recollect.   Gram Stain Report Called to,Read Back By and Verified With: Dianna Rossetti AT 2956 08/17/16 BY L BENFIELD    Report Status 08/17/2016 FINAL  Final     Radiology Studies: No results found.  Scheduled Meds: . allopurinol  300 mg Oral Daily  . azithromycin  500 mg Intravenous Q24H  . carvedilol  3.125 mg Oral BID  . cefpodoxime  200 mg Oral Q12H  . guaiFENesin  600 mg Oral BID  . loratadine  10 mg Oral QPM  . magnesium oxide  400 mg Oral Daily  . metolazone  2.5 mg Oral Once per day on Mon Thu  . pantoprazole  40 mg Oral BID  . potassium chloride  20 mEq Oral BID  . simvastatin  20 mg Oral q1800  . sodium chloride flush  3 mL Intravenous Q12H  . torsemide  40 mg Oral BID  . Warfarin - Pharmacist Dosing Inpatient   Does not apply q1800   Continuous Infusions:   LOS: 4 days     Penny Pia [facility] 08/17/2016, 10:18 AM Pager: [phone]  If 7PM-7AM, please contact night-coverage [email or web address] Password [email or web address] 08/17/2016, 10:18 AM

## 2016-08-17 NOTE — Care Management Important Message (Signed)
Important Message  Patient Details  Name: Ruben Walker MRN: [id number] Date of Birth: [birth year]   Medicare Important Message Given:  Yes    [name] 08/17/2016, 11:04 AM

## 2016-08-17 NOTE — Progress Notes (Signed)
Progress Note  Patient Name: Ruben Walker Date of Encounter: 08/17/2016  Primary Cardiologist:  Dr. Duke Salvia Dr. Graciela Husbands (EP)  Patient Profile     76 y.o. male w/ hx HTN, perm Afib s/p AV node ablation, on coumadin, ICD after cardiac arrest w/ no sig CAD, s/p gen change 05/2016, OSA, D-CHF w/ EF 55% 11/2015 echo, CKD III, obesity. Admit 03/26 w/ fever/cough, cards saw 03/28 for CHF.  Subjective   Coughing up more yellowish, foamy sputum. Still SOB at rest but feels is breathing a little better.   Inpatient Medications    Scheduled Meds: . allopurinol  300 mg Oral Daily  . azithromycin  500 mg Intravenous Q24H  . carvedilol  3.125 mg Oral BID  . cefTRIAXone (ROCEPHIN)  IV  1 g Intravenous Q24H  . furosemide  80 mg Intravenous BID  . guaiFENesin  600 mg Oral BID  . loratadine  10 mg Oral QPM  . magnesium oxide  400 mg Oral Daily  . metolazone  2.5 mg Oral Once per day on Mon Thu  . pantoprazole  40 mg Oral BID  . potassium chloride  20 mEq Oral BID  . simvastatin  20 mg Oral q1800  . sodium chloride flush  3 mL Intravenous Q12H  . Warfarin - Pharmacist Dosing Inpatient   Does not apply q1800   Continuous Infusions:  PRN Meds: sodium chloride, acetaminophen **OR** acetaminophen, albuterol, hydrALAZINE, promethazine, sodium chloride flush   Vital Signs    Vitals:   08/16/16 1224 08/16/16 2046 08/17/16 0017 08/17/16 0529  BP: 138/62 (!) 131/54 116/66 (!) 113/58  Pulse: 70 70 70 71  Resp: 20 20 20 20   Temp: 98 F (36.7 C) 97.8 F (36.6 C) 97.7 F (36.5 C) 97.9 F (36.6 C)  TempSrc: Oral Oral Oral Oral  SpO2: 100% 100% 100% 100%  Weight:    201 lb 8 oz (91.4 kg)  Height:        Intake/Output Summary (Last 24 hours) at 08/17/16 1610 Last data filed at 08/17/16 9604  Gross per 24 hour  Intake             1363 ml  Output             3370 ml  Net            -2007 ml   Filed Weights   08/15/16 1649 08/16/16 0500 08/17/16 0529  Weight: 206 lb 12.8 oz (93.8 kg)  206 lb 2.1 oz (93.5 kg) 201 lb 8 oz (91.4 kg)    Telemetry    Afib, V pacing - Personally Reviewed  ECG    n/a - Personally Reviewed  Physical Exam   General: Well developed, well nourished, male appearing in no acute distress. Head: Normocephalic, atraumatic.  Neck: Supple without bruits, JVD at 8-9 cm. Lungs:  Resp regular and mod labored, +wheeze, +rales, +rhonchi Heart: RRR, S1, S2, no S3, S4, soft murmur; no rub. Abdomen: Soft, non-tender, non-distended with normoactive bowel sounds. No hepatomegaly. No rebound/guarding. No obvious abdominal masses. Extremities: No clubbing, cyanosis, no sig edema. Distal pedal pulses are 2+ bilaterally. Neuro: Alert and oriented X 3. Moves all extremities spontaneously. Psych: Normal affect.  Labs    Hematology  Recent Labs Lab 08/13/16 1236 08/14/16 0246 08/17/16 0501  WBC 8.2 8.1 6.4  RBC 3.61* 3.37* 3.66*  HGB 10.9* 10.3* 10.9*  HCT 33.6* 31.3* 33.8*  MCV 93.1 92.9 92.3  MCH 30.2 30.6 29.8  MCHC 32.4 32.9  32.2  RDW 14.2 14.6 14.0  PLT 132* 132* 170    Chemistry  Recent Labs Lab 08/14/16 0246 08/15/16 0417 08/16/16 0456 08/17/16 0501  NA 136 137 137 137  K 3.6 3.4* 4.0 3.6  CL 97* 98* 100* 94*  CO2 25 28 29 31   GLUCOSE 92 97 104* 90  BUN 38* 38* 36* 34*  CREATININE 1.73* 1.67* 1.53* 1.38*  CALCIUM 8.6* 9.0 9.3 10.0  PROT 5.5*  --   --   --   ALBUMIN 3.3*  --   --   --   AST 60*  --   --   --   ALT 31  --   --   --   ALKPHOS 47  --   --   --   BILITOT 0.9  --   --   --   GFRNONAA 37* 39* 43* 49*  GFRAA 43* 45* 50* 57*  ANIONGAP 14 11 8 12      Cardiac Enzymes  Recent Labs Lab 08/13/16 1608 08/13/16 2124 08/14/16 0246  TROPONINI 0.06* 0.06* 0.06*     Recent Labs Lab 08/13/16 1700  TROPIPOC 0.05     BNP  Recent Labs Lab 08/13/16 1236  BNP 699.5*    Lab Results  Component Value Date   INR 1.95 08/17/2016   INR 2.47 08/16/2016   INR 2.13 08/15/2016    Radiology    Dg Chest Port 1  View Result Date: 08/14/2016 CLINICAL DATA:  Acute respiratory failure with hypoxia. EXAM: PORTABLE CHEST 1 VIEW COMPARISON:  08/13/2016 FINDINGS: Cardiac pacemaker in stable position. Cardiomediastinal silhouette is stably enlarged. There is better aeration of the lungs, with residual patchy airspace consolidation in the left mid and lower lung field. Mild increase of the interstitial markings. Osseous structures are without acute abnormality. Soft tissues are grossly normal. IMPRESSION: Better aeration of the lungs consistent with improving pulmonary edema. There is however apparent patchy airspace consolidation in the left mid and lower lung field, which may represent infectious consolidation. Electronically Signed   By: Ted Mcalpine M.D.   On: 08/14/2016 08:28   Dg Chest Portable 1 View Result Date: 08/13/2016 CLINICAL DATA:  Shortness of breath. EXAM: PORTABLE CHEST 1 VIEW COMPARISON:  10/01/2014. FINDINGS: Cardiac pacer noted with lead tip projected right ventricle. Cardiomegaly with pulmonary vascular prominence and bilateral interstitial prominence consistent CHF. Small bilateral pleural effusions. IMPRESSION: Congestive heart failure with bilateral pulmonary interstitial edema . Electronically Signed   By: Maisie Fus  Register   On: 08/13/2016 12:31     Cardiac Studies   ECHO: 08/14/2016 - Left ventricle: The cavity size was normal. Wall thickness was   normal. Systolic function was normal. The estimated ejection   fraction was in the range of 55% to 60%. Wall motion was normal;   there were no regional wall motion abnormalities. The study is   not technically sufficient to allow evaluation of LV diastolic function. - Mitral valve: Calcified annulus. There was mild regurgitation. - Left atrium: The atrium was severely dilated. - Right atrium: The atrium was moderately dilated. - Tricuspid valve: There was moderate regurgitation. - Pulmonic valve: Peak gradient (S): 37 mm Hg. -  Pulmonary arteries: Systolic pressure was moderately increased. Impressions: - Technically difficult; definity used; normal LV systolic   function; biatrial enlargement; mild MR; moderate TR with   moderately elevated pulmonary pressure.  Patient Profile     76 y.o. male w/ hx HTN, perm Afib s/p AV node ablation, on coumadin, ICD  after cardiac arrest w/ no sig CAD, s/p gen change 05/2016, OSA, D-CHF w/ EF 55% 11/2015 echo, CKD III, obesity. Admit 03/26 w/ fever/cough, cards saw 03/28 for CHF.  Assessment & Plan    Acute respiratory failure 2/2 acute CHF and CAP: no longer on BIPAP.  - however, pt actively wheezing and O2 sats drop when he removes O2, was not on PTA  Acute on chronic diastolic CHF: in the setting of acute PNA.  - ECHO this admission with EF 55-60%.  - Currently on lasix 80mg  IV BID. Net neg 7.8L.  - Weight 211>>201 lbs, wt was 204  when he saw Dr. Duke Salvia.  - Creat improving with diuresis 1.8--> 1.73>>1.67>>1.53>>1.38.  - exam is improving, change to po Demadex 40 mg qd (home dose), hold Zaroxolyn for now.  Elevated troponin: low and flat trend: 0.6--> 0.6--> 0.06 c/w demand ischemia in the setting of acute CHF and PNA, no ischemic eval planned  AKI: creat improving with diuresis. 1.8--> 1.73-->1.67-->1.53>>1.38  HTN: BP well controlled.   Chronic atrial fibrillation: s/p AV ablation, on coumadin, CHADSVASC=4 (HTN, CHF, CAD, age x 1).   HLD: continue statin   ICD: with recent gen change in 05/2016. Followed by Dr. Graciela Husbands   Active Problems:   Essential hypertension   Permanent atrial fibrillation (HCC)   Long term current use of anticoagulant therapy   Complete heart block  s/p AV ablation   ICD (implantable cardioverter-defibrillator) in place   Chronic renal insufficiency, stage III (moderate)   Acute respiratory failure with hypoxemia (HCC)   CAP (community acquired pneumonia)   AKI (acute kidney injury) (HCC)   Acute on chronic diastolic heart  failure (HCC)   Pressure injury of skin    Signed, Theodore Demark , PA-C 8:08 AM 08/17/2016 Pager: [phone]  Patient seen, examined. Available data reviewed. Agree with findings, assessment, and plan as outlined by Theodore Demark, PA-C. Exam: A&Ox4, NAD, JVP mildly elevated, lungs with diffuse rhonchi, CV: RRR no murmur, abdomen obese, NT, extremities without edema.   Lab data reviewed. Creatinine continues to trend down with diuresis. At this point his primary symptoms appear to be respiratory based on his physical exam. Would stop IV diuretics and change him back to torsemide but at an increased dose of 60 mg daily. Will arrange hospital FU with Dr Duke Salvia or her PA/NP.  Tonny Bollman, M.D. 08/17/2016 3:38 PM

## 2016-08-18 LAB — CULTURE, BLOOD (ROUTINE X 2)
Culture: NO GROWTH
Culture: NO GROWTH

## 2016-08-18 LAB — BASIC METABOLIC PANEL
ANION GAP: 11 (ref 5–15)
BUN: 42 mg/dL — AB (ref 6–20)
CO2: 29 mmol/L (ref 22–32)
Calcium: 10.2 mg/dL (ref 8.9–10.3)
Chloride: 94 mmol/L — ABNORMAL LOW (ref 101–111)
Creatinine, Ser: 1.4 mg/dL — ABNORMAL HIGH (ref 0.61–1.24)
GFR calc Af Amer: 56 mL/min — ABNORMAL LOW (ref >60)
GFR, EST NON AFRICAN AMERICAN: 48 mL/min — AB (ref >60)
GLUCOSE: 98 mg/dL (ref 65–99)
Potassium: 4.2 mmol/L (ref 3.5–5.1)
Sodium: 134 mmol/L — ABNORMAL LOW (ref 135–145)

## 2016-08-18 LAB — PROTIME-INR
INR: 2.28
Prothrombin Time: 25.5 seconds — ABNORMAL HIGH (ref 11.4–15.2)

## 2016-08-18 MED ORDER — WARFARIN SODIUM 2.5 MG PO TABS
2.5000 mg | ORAL_TABLET | Freq: Once | ORAL | Status: AC
  Administered 2016-08-18: 2.5 mg via ORAL
  Filled 2016-08-18: qty 1

## 2016-08-18 MED ORDER — CEFPODOXIME PROXETIL 200 MG PO TABS: 200.0000 mg | ORAL_TABLET | Freq: Two times a day (BID) | ORAL | 0 refills | Status: AC

## 2016-08-18 MED ORDER — TORSEMIDE 20 MG PO TABS: 60.0000 mg | ORAL_TABLET | Freq: Every day | ORAL | 0 refills | Status: DC

## 2016-08-18 MED ORDER — AZITHROMYCIN 500 MG PO TABS: 500.0000 mg | ORAL_TABLET | Freq: Every day | ORAL | 0 refills | Status: AC

## 2016-08-18 NOTE — Progress Notes (Signed)
Progress Note  Patient Name: Ruben Walker Date of Encounter: 08/18/2016  Primary Cardiologist:  Dr. Duke Salvia Dr. Graciela Husbands (EP)  Patient Profile     76 y.o. male w/ hx HTN, perm Afib s/p AV node ablation, on coumadin, ICD after cardiac arrest w/ no sig CAD, s/p gen change 05/2016, OSA, D-CHF w/ EF 55% 11/2015 echo, CKD III, obesity. Admit 03/26 w/ fever/cough, cards saw 03/28 for CHF.  Subjective   Less dyspnea wants to go home   Inpatient Medications    Scheduled Meds: . allopurinol  300 mg Oral Daily  . azithromycin  500 mg Intravenous Q24H  . carvedilol  3.125 mg Oral BID  . cefTRIAXone (ROCEPHIN)  IV  1 g Intravenous Q24H  . furosemide  80 mg Intravenous BID  . guaiFENesin  600 mg Oral BID  . loratadine  10 mg Oral QPM  . magnesium oxide  400 mg Oral Daily  . metolazone  2.5 mg Oral Once per day on Mon Thu  . pantoprazole  40 mg Oral BID  . potassium chloride  20 mEq Oral BID  . simvastatin  20 mg Oral q1800  . sodium chloride flush  3 mL Intravenous Q12H  . Warfarin - Pharmacist Dosing Inpatient   Does not apply q1800   Continuous Infusions:  PRN Meds: sodium chloride, acetaminophen **OR** acetaminophen, albuterol, hydrALAZINE, promethazine, sodium chloride flush   Vital Signs    Vitals:   08/18/16 0326 08/18/16 0945 08/18/16 0953 08/18/16 1259  BP: 110/61 (!) 143/60  (!) 137/58  Pulse: 72 70  70  Resp: 18   20  Temp: 98.1 F (36.7 C)   97.5 F (36.4 C)  TempSrc: Oral   Oral  SpO2: 98%  99% 98%  Weight: 200 lb 14.4 oz (91.1 kg)     Height:        Intake/Output Summary (Last 24 hours) at 08/18/16 1325 Last data filed at 08/18/16 1300  Gross per 24 hour  Intake              480 ml  Output             1850 ml  Net            -1370 ml   Filed Weights   08/16/16 0500 08/17/16 0529 08/18/16 0326  Weight: 206 lb 2.1 oz (93.5 kg) 201 lb 8 oz (91.4 kg) 200 lb 14.4 oz (91.1 kg)    Telemetry    Afib, V pacing 08/18/2016  - Personally Reviewed  ECG    Pacing LBBB rate miscalculated by computer 72 range   Physical Exam   Affect appropriate Chronically ill white male  HEENT: normal Neck supple with no adenopathy JVP normal no bruits no thyromegaly Lungs rhonchi and wheezing left lung persists  and good diaphragmatic motion Heart:  S1/S2 no murmur, no rub, gallop or click PMI normal Abdomen: benighn, BS positve, no tenderness, no AAA no bruit.  No HSM or HJR Distal pulses intact with no bruits Trace LE edema with marked varicosities Neuro non-focal Skin warm and dry No muscular weakness   Labs    Hematology  Recent Labs Lab 08/13/16 1236 08/14/16 0246 08/17/16 0501  WBC 8.2 8.1 6.4  RBC 3.61* 3.37* 3.66*  HGB 10.9* 10.3* 10.9*  HCT 33.6* 31.3* 33.8*  MCV 93.1 92.9 92.3  MCH 30.2 30.6 29.8  MCHC 32.4 32.9 32.2  RDW 14.2 14.6 14.0  PLT 132* 132* 170    Chemistry  Recent Labs Lab 08/14/16 0246  08/16/16 0456 08/17/16 0501 08/18/16 0353  NA 136  < > 137 137 134*  K 3.6  < > 4.0 3.6 4.2  CL 97*  < > 100* 94* 94*  CO2 25  < > 29 31 29   GLUCOSE 92  < > 104* 90 98  BUN 38*  < > 36* 34* 42*  CREATININE 1.73*  < > 1.53* 1.38* 1.40*  CALCIUM 8.6*  < > 9.3 10.0 10.2  PROT 5.5*  --   --   --   --   ALBUMIN 3.3*  --   --   --   --   AST 60*  --   --   --   --   ALT 31  --   --   --   --   ALKPHOS 47  --   --   --   --   BILITOT 0.9  --   --   --   --   GFRNONAA 37*  < > 43* 49* 48*  GFRAA 43*  < > 50* 57* 56*  ANIONGAP 14  < > 8 12 11   < > = values in this interval not displayed.   Cardiac Enzymes  Recent Labs Lab 08/13/16 1608 08/13/16 2124 08/14/16 0246  TROPONINI 0.06* 0.06* 0.06*     Recent Labs Lab 08/13/16 1700  TROPIPOC 0.05     BNP  Recent Labs Lab 08/13/16 1236  BNP 699.5*    Lab Results  Component Value Date   INR 2.28 08/18/2016   INR 1.95 08/17/2016   INR 2.47 08/16/2016    Radiology    Dg Chest Port 1 View Result Date: 08/14/2016 CLINICAL DATA:  Acute respiratory  failure with hypoxia. EXAM: PORTABLE CHEST 1 VIEW COMPARISON:  08/13/2016 FINDINGS: Cardiac pacemaker in stable position. Cardiomediastinal silhouette is stably enlarged. There is better aeration of the lungs, with residual patchy airspace consolidation in the left mid and lower lung field. Mild increase of the interstitial markings. Osseous structures are without acute abnormality. Soft tissues are grossly normal. IMPRESSION: Better aeration of the lungs consistent with improving pulmonary edema. There is however apparent patchy airspace consolidation in the left mid and lower lung field, which may represent infectious consolidation. Electronically Signed   By: Ted Mcalpine M.D.   On: 08/14/2016 08:28   Dg Chest Portable 1 View Result Date: 08/13/2016 CLINICAL DATA:  Shortness of breath. EXAM: PORTABLE CHEST 1 VIEW COMPARISON:  10/01/2014. FINDINGS: Cardiac pacer noted with lead tip projected right ventricle. Cardiomegaly with pulmonary vascular prominence and bilateral interstitial prominence consistent CHF. Small bilateral pleural effusions. IMPRESSION: Congestive heart failure with bilateral pulmonary interstitial edema . Electronically Signed   By: Maisie Fus  Register   On: 08/13/2016 12:31     Cardiac Studies   ECHO: 08/14/2016 - Left ventricle: The cavity size was normal. Wall thickness was   normal. Systolic function was normal. The estimated ejection   fraction was in the range of 55% to 60%. Wall motion was normal;   there were no regional wall motion abnormalities. The study is   not technically sufficient to allow evaluation of LV diastolic function. - Mitral valve: Calcified annulus. There was mild regurgitation. - Left atrium: The atrium was severely dilated. - Right atrium: The atrium was moderately dilated. - Tricuspid valve: There was moderate regurgitation. - Pulmonic valve: Peak gradient (S): 37 mm Hg. - Pulmonary arteries: Systolic pressure was moderately  increased. Impressions: - Technically  difficult; definity used; normal LV systolic   function; biatrial enlargement; mild MR; moderate TR with   moderately elevated pulmonary pressure.  Patient Profile     76 y.o. male w/ hx HTN, perm Afib s/p AV node ablation, on coumadin, ICD after cardiac arrest w/ no sig CAD, s/p gen change 05/2016, OSA, D-CHF w/ EF 55% 11/2015 echo, CKD III, obesity. Admit 03/26 w/ fever/cough, cards saw 03/28 for CHF.  Assessment & Plan    Acute respiratory failure 2/2 acute CHF and CAP: improved on zithromycin consider f/u CXR for left mid and lower lobe lung infiltrate  Acute on chronic diastolic CHF: in the setting of acute PNA. EF 55-60% by echo good diuresis on oral demadex ok to d/c on previous Home dose with zaroxyln  Elevated troponin: low and flat trend: 0.6--> 0.6--> 0.06 c/w demand ischemia in the setting of acute CHF and PNA, no ischemic eval planned  AKI: creat improving with diuresis. Now 1.4 stable   HTN: BP well controlled.   Chronic atrial fibrillation: s/p AV ablation, on coumadin, CHADSVASC=4 (HTN, CHF, CAD, age x 1). INR 2.28   HLD: continue statin   ICD: with recent gen change in 05/2016. Followed by Dr. Graciela Husbands   Will sign off and arrange outpatient f/u with DR Marguarite Arbour

## 2016-08-18 NOTE — Progress Notes (Signed)
Patient D/C'd home via [employer] cab after he receivedhis hs meds including Vantin (BID). No acute distress noted.

## 2016-08-18 NOTE — Progress Notes (Signed)
Pt's nose bleed subsided.  Resting quietly in bed.  Amanda Pea, Charity fundraiser.

## 2016-08-18 NOTE — Progress Notes (Signed)
Pt will take taxi cab home. Pt made aware of amount to be paid as informed by cab co.   D/c instructions given  to pt noted pt does not have  Rx   cefpodoxime .  Oncoming nurse made aware to f/u with MD. Amanda Pea, RN.

## 2016-08-18 NOTE — Progress Notes (Signed)
Pt attempted to call for ride home and unsuccessful.  Pt states "I can pay my way home to [place] by taxi.  Notified Dt [facility] and instructed will be ok for him to go home via cab.  Also pt states has enough money to pay.  Pt made aware.  Amanda Pea, RN

## 2016-08-18 NOTE — Progress Notes (Signed)
SATURATION QUALIFICATIONS: (This note is used to comply with regulatory documentation for home oxygen) ° °Patient Saturations on Room Air at Rest = 99% ° °Patient Saturations on Room Air while Ambulating = 99% ° °Patient Saturations on 0 Liters of oxygen while Ambulating = 99% ° °Please briefly explain why patient needs home oxygen: °

## 2016-08-18 NOTE — Progress Notes (Signed)
Pt noted to have mild epistaxis.  Cold wash cloth applied to nares.  Pt states he has been have it from the 02.  O2 with humiidifer on.  Declined ice packs.  Will continue to monitor.  Amanda Pea, Charity fundraiser.

## 2016-08-18 NOTE — Progress Notes (Signed)
Patient has received D/C instruction from Ruben Walker, day RN, but she recognized that he did not have prescription for one of his Vantin.On call, Dr Antionette Char called and prescription will sent to [facility] pharmacy on [address] in [place].[name]

## 2016-08-18 NOTE — Discharge Summary (Signed)
Physician Discharge Summary  Ruben Walker ZOX:[id number] DOB: 01-04-1941 DOA: 08/13/2016  PCP: [name], MD  Admit date: 08/13/2016 Discharge date: 08/18/2016  Time spent: > 35 minutes  Recommendations for Outpatient Follow-up:  1. Monitor serum creatinine. ARB held as a result of elevated serum creatinine decide whether or not to continue 2. Monitor sodium levels And potassium levels 3. Wean off of oxygen as tolerated   Discharge Diagnoses:  Active Problems:   Essential hypertension   Permanent atrial fibrillation (HCC)   Long term current use of anticoagulant therapy   Complete heart block  s/p AV ablation   ICD (implantable cardioverter-defibrillator) in place   Chronic renal insufficiency, stage III (moderate)   Acute respiratory failure with hypoxemia (HCC)   CAP (community acquired pneumonia)   AKI (acute kidney injury) (HCC)   Acute on chronic diastolic heart failure (HCC)   Pressure injury of skin   Discharge Condition: stable  Diet recommendation: regular diet  Filed Weights   08/16/16 0500 08/17/16 0529 08/18/16 0326  Weight: 93.5 kg (206 lb 2.1 oz) 91.4 kg (201 lb 8 oz) 91.1 kg (200 lb 14.4 oz)    History of present illness:  76 y.o. male with medical history significant of complete heart block status post ablation with subsequent cardiac arrest in 2000 status post ICD placement, chronic atrial fibrillation, C KD, CHF with an EF of 45%, GERD, hyperlipidemia, hypertension, anemia, OSA presenting with a history of worsening shortness of breath with associated wheezing and productive brown sputum. Associated with subjective fevers and chills  Pt treated for acute respiratory failure due to combination of systolic HF exacerbation and pna.  Hospital Course:  Acute respiratory failure Combination of acute CHF and community acquired pneumonia.  - Patient initially required BiPAP but was able to be weaned it with diuresis and antibiotic therapy. Currently  euvolemic and plan is to discharge on oral antibiotics - Patient still hypoxic with activity as such will fill out orders for DME oxygen  Acute on chronic systolic CHF - Cardiology managed while patient in house - Patient back on oral diuretics  Hypokalemia - resolved after replacement, continue oral potassium replacement while on oral diuretics  Community acquired pneumonia Febrile overnight. On bipap -continue azithromycin and start to de escalate. Will transition to oral cephalosporin, if tolerates will d/c next am. -sputum gram stain pending -sputum culture pending -urine strep/legionella, negative  Acute kidney injury on CKD stage 3 Baseline of 1.4. 1.84 on admission. Likely secondary to cardiorenal syndrome - Diuresis - Nearing baseline currently  Complete heart block S/p ablation. AICD  Atrial fibrillation Cardiac arrest AICD in place -continue Coumadin -telemetry  Essential hypertension -continue to hold losartan secondary to AKI -continue coreg  GERD -continue PPI  Procedures:  None  Consultations:  Cardiology  Discharge Exam: Vitals:   08/18/16 0945 08/18/16 1259  BP: (!) 143/60 (!) 137/58  Pulse: 70 70  Resp:  20  Temp:  97.5 F (36.4 C)    General: Patient in no acute distress, alert and awake Cardiovascular: Regular rate and rhythm, no murmurs rubs Respiratory: No increased work of breathing, no wheezes  Discharge Instructions   Discharge Instructions    Call MD for:  difficulty breathing, headache or visual disturbances    Complete by:  As directed    Call MD for:  temperature >100.4    Complete by:  As directed    Diet - low sodium heart healthy    Complete by:  As directed  Increase activity slowly    Complete by:  As directed      Current Discharge Medication List    START taking these medications   Details  azithromycin (ZITHROMAX) 500 MG tablet Take 1 tablet (500 mg total) by mouth daily. Qty: 2 tablet,  Refills: 0    cefpodoxime (VANTIN) 200 MG tablet Take 1 tablet (200 mg total) by mouth every 12 (twelve) hours. Qty: 4 tablet, Refills: 0      CONTINUE these medications which have CHANGED   Details  torsemide (DEMADEX) 20 MG tablet Take 3 tablets (60 mg total) by mouth daily. Qty: 30 tablet, Refills: 0      CONTINUE these medications which have NOT CHANGED   Details  allopurinol (ZYLOPRIM) 300 MG tablet Take 1 tablet (300 mg total) by mouth daily. Qty: 45 tablet, Refills: 1    carvedilol (COREG) 3.125 MG tablet Take 1 tablet (3.125 mg total) by mouth 2 (two) times daily. Qty: 60 tablet, Refills: 11    cholecalciferol (VITAMIN D) 1000 UNITS tablet Take 1,000 Units by mouth daily.    fluticasone (FLONASE) 50 MCG/ACT nasal spray Place 2 sprays into both nostrils daily as needed for allergies or rhinitis. Reported on 07/11/2015    levocetirizine (XYZAL) 5 MG tablet Take 5 mg by mouth every evening. Refills: 11    magnesium oxide (MAG-OX) 400 MG tablet Take 400 mg by mouth daily.    metolazone (ZAROXOLYN) 2.5 MG tablet Take one tablet (2.5 mg) by mouth twice weekly Qty: 10 tablet, Refills: 0    pantoprazole (PROTONIX) 40 MG tablet TAKE ONE TABLET TWICE DAILY Qty: 60 tablet, Refills: 5    potassium chloride (MICRO-K) 10 MEQ CR capsule Take 2 capsules (20 mEq total) by mouth daily. Qty: 60 capsule, Refills: 11    simvastatin (ZOCOR) 20 MG tablet Take 1 tablet (20 mg total) by mouth at bedtime. Qty: 30 tablet, Refills: 11    warfarin (COUMADIN) 5 MG tablet Take as directed by [facility] Qty: 30 tablet, Refills: 3      STOP taking these medications     valsartan (DIOVAN) 160 MG tablet        No Known Allergies Follow-up Information    [facility] Follow up.   Specialty:  Home Health Services Why:  They will do your home health care at your home Contact information: [address] [place] [address] [phone]            The  results of significant diagnostics from this hospitalization (including imaging, microbiology, ancillary and laboratory) are listed below for reference.    Significant Diagnostic Studies: Dg Chest Port 1 View  Result Date: 08/14/2016 CLINICAL DATA:  Acute respiratory failure with hypoxia. EXAM: PORTABLE CHEST 1 VIEW COMPARISON:  08/13/2016 FINDINGS: Cardiac pacemaker in stable position. Cardiomediastinal silhouette is stably enlarged. There is better aeration of the lungs, with residual patchy airspace consolidation in the left mid and lower lung field. Mild increase of the interstitial markings. Osseous structures are without acute abnormality. Soft tissues are grossly normal. IMPRESSION: Better aeration of the lungs consistent with improving pulmonary edema. There is however apparent patchy airspace consolidation in the left mid and lower lung field, which may represent infectious consolidation. Electronically Signed   By: Ted Mcalpine M.D.   On: 08/14/2016 08:28   Dg Chest Portable 1 View  Result Date: 08/13/2016 CLINICAL DATA:  Shortness of breath. EXAM: PORTABLE CHEST 1 VIEW COMPARISON:  10/01/2014. FINDINGS: Cardiac pacer  noted with lead tip projected right ventricle. Cardiomegaly with pulmonary vascular prominence and bilateral interstitial prominence consistent CHF. Small bilateral pleural effusions. IMPRESSION: Congestive heart failure with bilateral pulmonary interstitial edema . Electronically Signed   By: Maisie Fus  Register   On: 08/13/2016 12:31    Microbiology: Recent Results (from the past 240 hour(s))  Blood Culture (routine x 2)     Status: None   Collection Time: 08/13/16  1:25 PM  Result Value Ref Range Status   Specimen Description BLOOD LEFT ANTECUBITAL  Final   Special Requests BOTTLES DRAWN AEROBIC ONLY 8CC  Final   Culture NO GROWTH 5 DAYS  Final   Report Status 08/18/2016 FINAL  Final  Blood Culture (routine x 2)     Status: None   Collection Time: 08/13/16  1:30  PM  Result Value Ref Range Status   Specimen Description BLOOD RIGHT HAND  Final   Special Requests BOTTLES DRAWN AEROBIC AND ANAEROBIC 10CC  Final   Culture NO GROWTH 5 DAYS  Final   Report Status 08/18/2016 FINAL  Final  Respiratory Panel by PCR     Status: Abnormal   Collection Time: 08/13/16  3:41 PM  Result Value Ref Range Status   Adenovirus NOT DETECTED NOT DETECTED Final   Coronavirus 229E NOT DETECTED NOT DETECTED Final   Coronavirus HKU1 NOT DETECTED NOT DETECTED Final   Coronavirus NL63 NOT DETECTED NOT DETECTED Final   Coronavirus OC43 NOT DETECTED NOT DETECTED Final   Metapneumovirus DETECTED (A) NOT DETECTED Final   Rhinovirus / Enterovirus NOT DETECTED NOT DETECTED Final   Influenza A NOT DETECTED NOT DETECTED Final   Influenza B NOT DETECTED NOT DETECTED Final   Parainfluenza Virus 1 NOT DETECTED NOT DETECTED Final   Parainfluenza Virus 2 NOT DETECTED NOT DETECTED Final   Parainfluenza Virus 3 NOT DETECTED NOT DETECTED Final   Parainfluenza Virus 4 NOT DETECTED NOT DETECTED Final   Respiratory Syncytial Virus NOT DETECTED NOT DETECTED Final   Bordetella pertussis NOT DETECTED NOT DETECTED Final   Chlamydophila pneumoniae NOT DETECTED NOT DETECTED Final   Mycoplasma pneumoniae NOT DETECTED NOT DETECTED Final  MRSA PCR Screening     Status: None   Collection Time: 08/13/16  6:42 PM  Result Value Ref Range Status   MRSA by PCR NEGATIVE NEGATIVE Final    Comment:        The GeneXpert MRSA Assay (FDA approved for NASAL specimens only), is one component of a comprehensive MRSA colonization surveillance program. It is not intended to diagnose MRSA infection nor to guide or monitor treatment for MRSA infections.   Culture, sputum-assessment     Status: None   Collection Time: 08/17/16  8:39 AM  Result Value Ref Range Status   Specimen Description SPUTUM  Final   Special Requests NONE  Final   Sputum evaluation   Final    Sputum specimen not acceptable for  testing.  Please recollect.   Gram Stain Report Called to,Read Back By and Verified With: Dianna Rossetti AT 0934 08/17/16 BY L BENFIELD    Report Status 08/17/2016 FINAL  Final     Labs: Basic Metabolic Panel:  Recent Labs Lab 08/14/16 0246 08/15/16 0417 08/16/16 0456 08/17/16 0501 08/18/16 0353  NA 136 137 137 137 134*  K 3.6 3.4* 4.0 3.6 4.2  CL 97* 98* 100* 94* 94*  CO2 25 28 29 31 29   GLUCOSE 92 97 104* 90 98  BUN 38* 38* 36* 34* 42*  CREATININE 1.73*  1.67* 1.53* 1.38* 1.40*  CALCIUM 8.6* 9.0 9.3 10.0 10.2  MG 2.0  --   --   --   --    Liver Function Tests:  Recent Labs Lab 08/14/16 0246  AST 60*  ALT 31  ALKPHOS 47  BILITOT 0.9  PROT 5.5*  ALBUMIN 3.3*   No results for input(s): LIPASE, AMYLASE in the last 168 hours. No results for input(s): AMMONIA in the last 168 hours. CBC:  Recent Labs Lab 08/13/16 1236 08/14/16 0246 08/17/16 0501  WBC 8.2 8.1 6.4  NEUTROABS 6.2  --   --   HGB 10.9* 10.3* 10.9*  HCT 33.6* 31.3* 33.8*  MCV 93.1 92.9 92.3  PLT 132* 132* 170   Cardiac Enzymes:  Recent Labs Lab 08/13/16 1608 08/13/16 2124 08/14/16 0246  TROPONINI 0.06* 0.06* 0.06*   BNP: BNP (last 3 results)  Recent Labs  08/03/16 1227 08/13/16 1236  BNP 125.6* 699.5*    ProBNP (last 3 results) No results for input(s): PROBNP in the last 8760 hours.  CBG: No results for input(s): GLUCAP in the last 168 hours.  Signed:  Penny Pia MD.  [facility] 08/18/2016, 1:26 PM

## 2016-08-18 NOTE — Progress Notes (Signed)
ANTICOAGULATION CONSULT NOTE - Follow Up Consult  Pharmacy Consult for Coumadin Indication: atrial fibrillation  No Known Allergies  Patient Measurements: Height: 5\' 2"  (157.5 cm) Weight: 200 lb 14.4 oz (91.1 kg) (c scale) IBW/kg (Calculated) : 54.6  Vital Signs: Temp: 98.1 F (36.7 C) (03/31 0326) Temp Source: Oral (03/31 0326) BP: 110/61 (03/31 0326) Pulse Rate: 72 (03/31 0326)  Labs:  Recent Labs  08/16/16 0456 08/17/16 0501 08/18/16 0353  HGB  --  10.9*  --   HCT  --  33.8*  --   PLT  --  170  --   LABPROT 27.2* 22.5* 25.5*  INR 2.47 1.95 2.28  CREATININE 1.53* 1.38* 1.40*    Estimated Creatinine Clearance: 45.3 mL/min (A) (by C-G formula based on SCr of 1.4 mg/dL (H)).  Assessment: 75 yoM on coumadin PTA for atrial fibrillation. INR on admission 1.52 with last dose taken on 3/25. INR fairly labile during admit but is now therapeutic at 2.28.   PTA Coumadin Dose = 5mg  Tues/Thurs, 2.5mg  all other days  Goal of Therapy:  INR 2-3 Monitor platelets by anticoagulation protocol: Yes   Plan:  -Coumadin 2.5mg  PO x1 tonight -Daily INR, S/Sx bleeding -If discharged today - recommend continuing home regimen (INR recheck scheduled for 4/2)  Fredonia Highland, PharmD PGY-1 Pharmacy Resident Pager: [phone] 08/18/2016

## 2016-08-20 ENCOUNTER — Ambulatory Visit (INDEPENDENT_AMBULATORY_CARE_PROVIDER_SITE_OTHER): Payer: Medicare Other | Admitting: Pharmacist Clinician (PhC)/ Clinical Pharmacy Specialist

## 2016-08-20 ENCOUNTER — Telehealth: Payer: Self-pay

## 2016-08-20 DIAGNOSIS — Z7901 Long term (current) use of anticoagulants: Secondary | ICD-10-CM

## 2016-08-20 DIAGNOSIS — I4891 Unspecified atrial fibrillation: Secondary | ICD-10-CM

## 2016-08-20 DIAGNOSIS — I482 Chronic atrial fibrillation: Secondary | ICD-10-CM | POA: Diagnosis not present

## 2016-08-20 DIAGNOSIS — I4821 Permanent atrial fibrillation: Secondary | ICD-10-CM

## 2016-08-20 LAB — POCT INR: INR: 2.9

## 2016-08-20 NOTE — Telephone Encounter (Signed)
Noted TCM phone contact with patient.

## 2016-08-20 NOTE — Telephone Encounter (Signed)
Transition Care Management Follow-up Telephone Call   Date discharged? 08/18/2016   How have you been since you were released from the hospital? "doing pretty good"   Do you understand why you were in the hospital? yes, "congestive heart failure and high temperature"   Do you understand the discharge instructions? no, has questions about medications. Questions answered.    Where were you discharged to? Home.    Items Reviewed:  Medications reviewed: yes  Allergies reviewed: yes  Dietary changes reviewed: no  Referrals reviewed: yes. Home Health referral for home oxygen. Patient does not require oxygen at this time.    Functional Questionnaire:   Activities of Daily Living (ADLs):   He states they are independent in the following: ambulation, bathing and hygiene, feeding, continence, grooming, toileting and dressing States they require assistance with the following: None.   Any transportation issues/concerns?: no   Any patient concerns? no   Confirmed importance and date/time of follow-up visits scheduled yes  Provider Appointment booked with PCP 08/30/2016 @ 11:30.   Confirmed with patient if condition begins to worsen call PCP or go to the ER.  Patient was given the office number and encouraged to call back with question or concerns.  : yes

## 2016-08-27 ENCOUNTER — Other Ambulatory Visit: Payer: Self-pay | Admitting: *Deleted

## 2016-08-27 ENCOUNTER — Other Ambulatory Visit: Payer: Self-pay | Admitting: Internal Medicine

## 2016-08-27 ENCOUNTER — Other Ambulatory Visit: Payer: Self-pay | Admitting: Cardiology

## 2016-08-27 NOTE — Patient Outreach (Signed)
[facility]) Care Management  08/27/2016  Ruben Walker 1940-07-22 [id number]  Referral via EMMI-Heart failure for red dashboard for weighed today?-no Referral via EMMI-Pneumonia -eating on regular basis-no>  Telephone call to patient who was advised of reason for call. HIPPA verification received from patient.   Patient voices that he had not been weighing daily since hospital discharge. States he has workable scales and had weighed himself yesterday and today and weight was around 200 lbs. Advised patient the reason for weighing & importance or recording weighs. Advised of reportable weight gain that required notification of doctor's office.  Patient voices understanding & voices that he does not have swelling & no trouble breathing. States he is not on oxygen.   States he answered he was not eating on regular basis because he eats 2 meals a day & not 3 meals day. States he eats breakfast & supper. States appetite is good. No problems obtaining groceries & fixing meals.  Following low salt diet.& limits fluids.   Patient states he has all of medications that were prescribed & is taking as ordered. States he has completed his antibiotic dosage. Voices he has not had fever & sputum is normal color.   Voices he has hospital  follow up appointment scheduled for 08/30/2016 and will driving self to appointment.   Patient consents to receive educational materials on heart failure.  States no health concerns or needs at this time,  Plan: Close case.  Colleen Can, RN BSN CCM Care Management Coordinator [facility] Care Management  [phone]

## 2016-08-27 NOTE — Telephone Encounter (Signed)
Rx request sent to pharmacy.  

## 2016-08-28 NOTE — Telephone Encounter (Signed)
Needs to be filled by PCP. Thanks!

## 2016-08-29 ENCOUNTER — Other Ambulatory Visit: Payer: Self-pay | Admitting: *Deleted

## 2016-08-29 NOTE — Patient Outreach (Signed)
[facility]) Care Management  08/29/2016  TAMARCUS DROLL Aug 07, 1940 [id number]  EMMI-HF referral for red dashboard on follow up appointment:  Telephone call to patient who was advised of reason for call. States he has not seen MD yet but has a scheduled appointment with primary care provider for tomorrow 08/30/16. States he plans to attend & will be driving self.   States he is feeling stronger & plan to ask if he can start walking in the park again since hospital stay. Voices that he is not having breathing difficulty & is not having swelling. States he is aware when to report weight gain to MD office as it relates to his HF condition. States he continues to take medications as prescribed by his MD. States he feels comfortable with his knowledge of heart failure in caring for himself.   Patient states he wishes to discontinue automated calls & does not feel the need for telephonic follow about heart failure. States he has been through program before. States he has contact number for [facility] & will call if he has needs or questions.    Plan: Close case. Advise care management assistant to stop EMMI calls.  Colleen Can, RN BSN CCM Care Management Coordinator [facility] Care Management  [phone]

## 2016-08-30 ENCOUNTER — Ambulatory Visit (INDEPENDENT_AMBULATORY_CARE_PROVIDER_SITE_OTHER): Payer: Medicare Other | Admitting: Family Medicine

## 2016-08-30 ENCOUNTER — Encounter: Payer: Self-pay | Admitting: *Deleted

## 2016-08-30 ENCOUNTER — Encounter: Payer: Self-pay | Admitting: Family Medicine

## 2016-08-30 VITALS — BP 134/67 | HR 70 | Temp 98.4°F | Resp 16 | Ht 62.0 in | Wt 206.8 lb

## 2016-08-30 DIAGNOSIS — E876 Hypokalemia: Secondary | ICD-10-CM

## 2016-08-30 DIAGNOSIS — I442 Atrioventricular block, complete: Secondary | ICD-10-CM | POA: Diagnosis not present

## 2016-08-30 DIAGNOSIS — N183 Chronic kidney disease, stage 3 unspecified: Secondary | ICD-10-CM

## 2016-08-30 DIAGNOSIS — Z8674 Personal history of sudden cardiac arrest: Secondary | ICD-10-CM | POA: Diagnosis not present

## 2016-08-30 DIAGNOSIS — D649 Anemia, unspecified: Secondary | ICD-10-CM | POA: Diagnosis not present

## 2016-08-30 DIAGNOSIS — Z7901 Long term (current) use of anticoagulants: Secondary | ICD-10-CM | POA: Diagnosis not present

## 2016-08-30 DIAGNOSIS — J9601 Acute respiratory failure with hypoxia: Secondary | ICD-10-CM | POA: Diagnosis not present

## 2016-08-30 DIAGNOSIS — N179 Acute kidney failure, unspecified: Secondary | ICD-10-CM | POA: Diagnosis not present

## 2016-08-30 DIAGNOSIS — J189 Pneumonia, unspecified organism: Secondary | ICD-10-CM | POA: Diagnosis not present

## 2016-08-30 DIAGNOSIS — I5021 Acute systolic (congestive) heart failure: Secondary | ICD-10-CM

## 2016-08-30 LAB — BASIC METABOLIC PANEL
BUN: 24 mg/dL — ABNORMAL HIGH (ref 6–23)
CHLORIDE: 99 meq/L (ref 96–112)
CO2: 30 meq/L (ref 19–32)
CREATININE: 1.5 mg/dL (ref 0.40–1.50)
Calcium: 9.9 mg/dL (ref 8.4–10.5)
GFR: 48.58 mL/min — ABNORMAL LOW (ref >60.00)
Glucose, Bld: 83 mg/dL (ref 70–99)
POTASSIUM: 3.8 meq/L (ref 3.5–5.1)
Sodium: 137 mEq/L (ref 135–145)

## 2016-08-30 LAB — CBC WITH DIFFERENTIAL/PLATELET
BASOS ABS: 0.1 10*3/uL (ref 0.0–0.1)
Basophils Relative: 1.1 % (ref 0.0–3.0)
EOS ABS: 0.2 10*3/uL (ref 0.0–0.7)
Eosinophils Relative: 2.8 % (ref 0.0–5.0)
HCT: 36.3 % — ABNORMAL LOW (ref 39.0–52.0)
Hemoglobin: 12.1 g/dL — ABNORMAL LOW (ref 13.0–17.0)
LYMPHS ABS: 1.9 10*3/uL (ref 0.7–4.0)
LYMPHS PCT: 23.5 % (ref 12.0–46.0)
MCHC: 33.2 g/dL (ref 30.0–36.0)
MCV: 92.1 fl (ref 78.0–100.0)
MONOS PCT: 12.9 % — AB (ref 3.0–12.0)
Monocytes Absolute: 1 10*3/uL (ref 0.1–1.0)
NEUTROS PCT: 59.7 % (ref 43.0–77.0)
Neutro Abs: 4.8 10*3/uL (ref 1.4–7.7)
Platelets: 277 10*3/uL (ref 150.0–400.0)
RBC: 3.95 Mil/uL — AB (ref 4.22–5.81)
RDW: 15.4 % (ref 11.5–15.5)
WBC: 8 10*3/uL (ref 4.0–10.5)
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
## 2016-08-30 NOTE — Progress Notes (Signed)
Pre visit review using our clinic review tool, if applicable. No additional management support is needed unless otherwise documented below in the visit note. 

## 2016-09-03 ENCOUNTER — Ambulatory Visit (INDEPENDENT_AMBULATORY_CARE_PROVIDER_SITE_OTHER): Payer: Medicare Other | Admitting: Pharmacist

## 2016-09-03 ENCOUNTER — Other Ambulatory Visit: Payer: Self-pay

## 2016-09-03 DIAGNOSIS — I4891 Unspecified atrial fibrillation: Secondary | ICD-10-CM

## 2016-09-03 DIAGNOSIS — I482 Chronic atrial fibrillation: Secondary | ICD-10-CM | POA: Diagnosis not present

## 2016-09-03 DIAGNOSIS — Z7901 Long term (current) use of anticoagulants: Secondary | ICD-10-CM | POA: Diagnosis not present

## 2016-09-03 DIAGNOSIS — N183 Chronic kidney disease, stage 3 unspecified: Secondary | ICD-10-CM

## 2016-09-03 DIAGNOSIS — I4821 Permanent atrial fibrillation: Secondary | ICD-10-CM

## 2016-09-03 LAB — POCT INR: INR: 2.7

## 2016-09-10 ENCOUNTER — Other Ambulatory Visit (INDEPENDENT_AMBULATORY_CARE_PROVIDER_SITE_OTHER): Payer: Medicare Other

## 2016-09-10 ENCOUNTER — Encounter: Payer: Self-pay | Admitting: Family Medicine

## 2016-09-10 DIAGNOSIS — N183 Chronic kidney disease, stage 3 unspecified: Secondary | ICD-10-CM

## 2016-09-10 LAB — BASIC METABOLIC PANEL
BUN: 24 mg/dL — ABNORMAL HIGH (ref 6–23)
CO2: 29 meq/L (ref 19–32)
Calcium: 9.5 mg/dL (ref 8.4–10.5)
Chloride: 100 mEq/L (ref 96–112)
Creatinine, Ser: 1.46 mg/dL (ref 0.40–1.50)
GFR: 50.11 mL/min — ABNORMAL LOW (ref >60.00)
Glucose, Bld: 93 mg/dL (ref 70–99)
Potassium: 4 mEq/L (ref 3.5–5.1)
SODIUM: 136 meq/L (ref 135–145)

## 2016-10-01 ENCOUNTER — Ambulatory Visit (INDEPENDENT_AMBULATORY_CARE_PROVIDER_SITE_OTHER): Payer: Medicare Other | Admitting: Pharmacist Clinician (PhC)/ Clinical Pharmacy Specialist

## 2016-10-01 DIAGNOSIS — I482 Chronic atrial fibrillation: Secondary | ICD-10-CM | POA: Diagnosis not present

## 2016-10-01 DIAGNOSIS — Z7901 Long term (current) use of anticoagulants: Secondary | ICD-10-CM | POA: Diagnosis not present

## 2016-10-01 DIAGNOSIS — I4891 Unspecified atrial fibrillation: Secondary | ICD-10-CM

## 2016-10-01 DIAGNOSIS — I4821 Permanent atrial fibrillation: Secondary | ICD-10-CM

## 2016-10-01 LAB — POCT INR: INR: 3

## 2016-10-03 ENCOUNTER — Other Ambulatory Visit: Payer: Self-pay | Admitting: *Deleted

## 2016-10-03 MED ORDER — TORSEMIDE 20 MG PO TABS: 60.0000 mg | ORAL_TABLET | Freq: Every day | ORAL | 5 refills | Status: DC

## 2016-10-03 NOTE — Telephone Encounter (Signed)
REFILL 

## 2016-10-19 ENCOUNTER — Other Ambulatory Visit: Payer: Self-pay | Admitting: Cardiovascular Disease

## 2016-10-19 NOTE — Telephone Encounter (Signed)
Refill Request.  

## 2016-10-24 ENCOUNTER — Telehealth: Payer: Self-pay | Admitting: Cardiology

## 2016-10-24 NOTE — Telephone Encounter (Signed)
New Message:   Please call,question about his appt for his remote on 10-29-16.

## 2016-10-24 NOTE — Telephone Encounter (Signed)
Spoke w/ pt and informed him that his remote transmission is automatic and it will send while he sleeps between 12 AM - 5 AM. If the transmission is not sent automatically he will receive a call instructing him how to send the transmission also informed pt that it is not time sensitive it can be done anytime on 10-29-16. Pt verbalized understanding.

## 2016-10-26 ENCOUNTER — Other Ambulatory Visit: Payer: Self-pay | Admitting: Internal Medicine

## 2016-10-29 ENCOUNTER — Ambulatory Visit (INDEPENDENT_AMBULATORY_CARE_PROVIDER_SITE_OTHER): Payer: Medicare Other | Admitting: Pharmacist

## 2016-10-29 ENCOUNTER — Ambulatory Visit (INDEPENDENT_AMBULATORY_CARE_PROVIDER_SITE_OTHER): Payer: Medicare Other | Admitting: *Deleted

## 2016-10-29 DIAGNOSIS — I4891 Unspecified atrial fibrillation: Secondary | ICD-10-CM | POA: Diagnosis not present

## 2016-10-29 DIAGNOSIS — I482 Chronic atrial fibrillation: Secondary | ICD-10-CM

## 2016-10-29 DIAGNOSIS — Z7901 Long term (current) use of anticoagulants: Secondary | ICD-10-CM

## 2016-10-29 DIAGNOSIS — I4821 Permanent atrial fibrillation: Secondary | ICD-10-CM

## 2016-10-29 DIAGNOSIS — I442 Atrioventricular block, complete: Secondary | ICD-10-CM | POA: Diagnosis not present

## 2016-10-29 LAB — POCT INR: INR: 3.1

## 2016-10-29 NOTE — Progress Notes (Signed)
Remote ICD transmission.   

## 2016-10-31 ENCOUNTER — Encounter: Payer: Self-pay | Admitting: Cardiology

## 2016-10-31 LAB — CUP PACEART REMOTE DEVICE CHECK
Brady Statistic RV Percent Paced: 100 %
Date Time Interrogation Session: 20180613114509
HighPow Impedance: 38 Ohm
Implantable Lead Implant Date: 20000223
Implantable Lead Model: 144
Lead Channel Impedance Value: 908 Ohm
MDC IDC LEAD LOCATION: 753860
MDC IDC LEAD SERIAL: 331438
MDC IDC MSMT LEADCHNL RA SENSING INTR AMPL: 20.8 mV
MDC IDC PG IMPLANT DT: 20180115
MDC IDC PG SERIAL: 195683

## 2016-11-05 ENCOUNTER — Other Ambulatory Visit: Payer: Self-pay | Admitting: *Deleted

## 2016-11-05 NOTE — Telephone Encounter (Signed)
Patient called and requested a refill of allopurinol. He stated that Dr Graciela Husbands has always refilled this for him. Okay to refill? Please advise. Thanks, MI

## 2016-11-06 NOTE — Telephone Encounter (Signed)
This really needs to be refilled by his PCP as we were only supposed to be starting him on this short term for possible cellutis/ gout in 2015.

## 2016-11-07 ENCOUNTER — Ambulatory Visit (INDEPENDENT_AMBULATORY_CARE_PROVIDER_SITE_OTHER): Payer: Medicare Other | Admitting: Pharmacist

## 2016-11-07 ENCOUNTER — Ambulatory Visit (INDEPENDENT_AMBULATORY_CARE_PROVIDER_SITE_OTHER): Payer: Medicare Other | Admitting: Cardiovascular Disease

## 2016-11-07 ENCOUNTER — Encounter: Payer: Self-pay | Admitting: Cardiovascular Disease

## 2016-11-07 VITALS — BP 132/64 | HR 72 | Ht 62.0 in | Wt 215.0 lb

## 2016-11-07 DIAGNOSIS — N183 Chronic kidney disease, stage 3 unspecified: Secondary | ICD-10-CM

## 2016-11-07 DIAGNOSIS — I482 Chronic atrial fibrillation, unspecified: Secondary | ICD-10-CM

## 2016-11-07 DIAGNOSIS — Z7901 Long term (current) use of anticoagulants: Secondary | ICD-10-CM | POA: Diagnosis not present

## 2016-11-07 DIAGNOSIS — Z95810 Presence of automatic (implantable) cardiac defibrillator: Secondary | ICD-10-CM

## 2016-11-07 DIAGNOSIS — I5033 Acute on chronic diastolic (congestive) heart failure: Secondary | ICD-10-CM

## 2016-11-07 DIAGNOSIS — I442 Atrioventricular block, complete: Secondary | ICD-10-CM

## 2016-11-07 DIAGNOSIS — I4891 Unspecified atrial fibrillation: Secondary | ICD-10-CM

## 2016-11-07 DIAGNOSIS — I4821 Permanent atrial fibrillation: Secondary | ICD-10-CM

## 2016-11-07 LAB — POCT INR: INR: 1.9

## 2016-11-07 MED ORDER — ALLOPURINOL 300 MG PO TABS: 300.0000 mg | ORAL_TABLET | Freq: Every day | ORAL | 1 refills | Status: DC

## 2016-11-07 NOTE — Patient Instructions (Addendum)
Medication Instructions:  TAKE A METOLAZONE TOMORROW (6/21) AND Saturday (6/23) 1 HOUR PRIOR TO YOUR TORSEMIDE   Labwork: NONE  Testing/Procedures: NONE  Follow-Up: Your physician recommends that you schedule a follow-up appointment in: 12/07/16   If you need a refill on your cardiac medications before your next appointment, please call your pharmacy.
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
## 2016-11-13 ENCOUNTER — Telehealth: Payer: Self-pay

## 2016-11-13 NOTE — Telephone Encounter (Signed)
Spoke with patient regarding AWV, declines visit.

## 2016-11-19 ENCOUNTER — Ambulatory Visit (INDEPENDENT_AMBULATORY_CARE_PROVIDER_SITE_OTHER): Payer: Medicare Other | Admitting: Pharmacist

## 2016-11-19 ENCOUNTER — Ambulatory Visit (INDEPENDENT_AMBULATORY_CARE_PROVIDER_SITE_OTHER): Payer: Medicare Other | Admitting: Cardiovascular Disease

## 2016-11-19 ENCOUNTER — Encounter: Payer: Self-pay | Admitting: Cardiovascular Disease

## 2016-11-19 VITALS — BP 129/68 | HR 70 | Ht 62.0 in | Wt 209.2 lb

## 2016-11-19 DIAGNOSIS — I4891 Unspecified atrial fibrillation: Secondary | ICD-10-CM

## 2016-11-19 DIAGNOSIS — Z7901 Long term (current) use of anticoagulants: Secondary | ICD-10-CM

## 2016-11-19 DIAGNOSIS — R601 Generalized edema: Secondary | ICD-10-CM | POA: Diagnosis not present

## 2016-11-19 DIAGNOSIS — I4821 Permanent atrial fibrillation: Secondary | ICD-10-CM

## 2016-11-19 DIAGNOSIS — I5033 Acute on chronic diastolic (congestive) heart failure: Secondary | ICD-10-CM | POA: Diagnosis not present

## 2016-11-19 DIAGNOSIS — I482 Chronic atrial fibrillation: Secondary | ICD-10-CM | POA: Diagnosis not present

## 2016-11-19 DIAGNOSIS — Z79899 Other long term (current) drug therapy: Secondary | ICD-10-CM

## 2016-11-19 LAB — POCT INR: INR: 2.7
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
## 2016-11-19 NOTE — Patient Instructions (Signed)
Medication Instructions:  Your physician recommends that you continue on your current medications as directed. Please refer to the Current Medication list given to you today.  Labwork: BMET TODAY   Testing/Procedures: NONE  Follow-Up: Your physician recommends that you schedule a follow-up appointment in: 3 MONTH OV  Any Other Special Instructions Will Be Listed Below (If Applicable). WEIGH YOURSELF DAILY IF YOU GAIN 2 POUNDS IN 24 HOURS OR 5 POUNDS IN 1 WEEK TAKE A METOLAZONE   If you need a refill on your cardiac medications before your next appointment, please call your pharmacy.

## 2016-11-20 LAB — BASIC METABOLIC PANEL
BUN/Creatinine Ratio: 32 — ABNORMAL HIGH (ref 10–24)
BUN: 58 mg/dL — AB (ref 8–27)
CALCIUM: 10.1 mg/dL (ref 8.6–10.2)
CHLORIDE: 93 mmol/L — AB (ref 96–106)
CO2: 27 mmol/L (ref 20–29)
CREATININE: 1.84 mg/dL — AB (ref 0.76–1.27)
GFR calc Af Amer: 41 mL/min/{1.73_m2} — ABNORMAL LOW (ref >59)
GFR calc non Af Amer: 35 mL/min/{1.73_m2} — ABNORMAL LOW (ref >59)
GLUCOSE: 91 mg/dL (ref 65–99)
Potassium: 4.7 mmol/L (ref 3.5–5.2)
Sodium: 137 mmol/L (ref 134–144)

## 2016-11-26 ENCOUNTER — Other Ambulatory Visit: Payer: Self-pay | Admitting: Internal Medicine

## 2016-11-26 ENCOUNTER — Other Ambulatory Visit: Payer: Self-pay | Admitting: Cardiovascular Disease

## 2016-11-26 NOTE — Telephone Encounter (Signed)
Refill Request.  

## 2016-11-27 NOTE — Progress Notes (Signed)
This encounter was created in error - please disregard.

## 2016-11-28 ENCOUNTER — Other Ambulatory Visit: Payer: Self-pay | Admitting: *Deleted

## 2016-11-28 DIAGNOSIS — Z79899 Other long term (current) drug therapy: Secondary | ICD-10-CM

## 2016-12-03 ENCOUNTER — Encounter: Payer: Self-pay | Admitting: Family Medicine

## 2016-12-04 ENCOUNTER — Other Ambulatory Visit: Payer: Self-pay | Admitting: Cardiovascular Disease

## 2016-12-04 NOTE — Telephone Encounter (Signed)
Please review for refill, Thanks !  

## 2016-12-04 NOTE — Telephone Encounter (Signed)
New message   Pt verbalized that the is calling for rn because there was a recall on his medication valcertain 160mg   He is out of the medication because he took the medication back to the pharmacy he needs a new prescription

## 2016-12-05 ENCOUNTER — Telehealth: Payer: Self-pay | Admitting: Cardiovascular Disease

## 2016-12-05 ENCOUNTER — Other Ambulatory Visit: Payer: Self-pay | Admitting: Cardiovascular Disease

## 2016-12-05 MED ORDER — LOSARTAN POTASSIUM 50 MG PO TABS: 50.0000 mg | ORAL_TABLET | Freq: Every day | ORAL | 1 refills | Status: DC

## 2016-12-05 MED ORDER — CANDESARTAN CILEXETIL 16 MG PO TABS: 16.0000 mg | ORAL_TABLET | Freq: Every day | ORAL | 6 refills | Status: DC

## 2016-12-05 NOTE — Telephone Encounter (Signed)
New message     Pt c/o medication issue:  1. Name of Medication: Valsartan  2. How are you currently taking this medication (dosage and times per day)?  1 in the morning   3. Are you having a reaction (difficulty breathing--STAT)? no  4. What is your medication issue?  He took his medication back to the pharmacy and they want you to replace it with something else  [facility]

## 2016-12-05 NOTE — Telephone Encounter (Signed)
Spoke to pharmacist, patient hx of HF and HTN, change valsartan to candesartan 16mg  daily.   Patient does not monitor BP at home, nurse visit BP check scheduled for 8/3.   Patient aware and verbalized understanding.  rx sent to pharmacy.

## 2016-12-05 NOTE — Telephone Encounter (Signed)
Refill Request.  

## 2016-12-06 ENCOUNTER — Telehealth: Payer: Self-pay | Admitting: Pharmacist

## 2016-12-06 DIAGNOSIS — Z79899 Other long term (current) drug therapy: Secondary | ICD-10-CM | POA: Diagnosis not present

## 2016-12-06 NOTE — Telephone Encounter (Signed)
Medication list updated  Patient to replace valsartan with candesartan.   Next BP check next week during INR check. He stated next check in 2 weeks will be done by PCP; therefore , okay to cancel appt with Nurse

## 2016-12-07 LAB — BASIC METABOLIC PANEL
BUN / CREAT RATIO: 23 (ref 10–24)
BUN: 37 mg/dL — ABNORMAL HIGH (ref 8–27)
CO2: 23 mmol/L (ref 20–29)
CREATININE: 1.64 mg/dL — AB (ref 0.76–1.27)
Calcium: 9.6 mg/dL (ref 8.6–10.2)
Chloride: 99 mmol/L (ref 96–106)
GFR calc Af Amer: 47 mL/min/{1.73_m2} — ABNORMAL LOW (ref >59)
GFR, EST NON AFRICAN AMERICAN: 41 mL/min/{1.73_m2} — AB (ref >59)
GLUCOSE: 85 mg/dL (ref 65–99)
Potassium: 3.7 mmol/L (ref 3.5–5.2)
SODIUM: 141 mmol/L (ref 134–144)

## 2016-12-10 ENCOUNTER — Ambulatory Visit (INDEPENDENT_AMBULATORY_CARE_PROVIDER_SITE_OTHER): Payer: Medicare Other | Admitting: Pharmacist

## 2016-12-10 DIAGNOSIS — I4891 Unspecified atrial fibrillation: Secondary | ICD-10-CM | POA: Diagnosis not present

## 2016-12-10 DIAGNOSIS — I4821 Permanent atrial fibrillation: Secondary | ICD-10-CM

## 2016-12-10 DIAGNOSIS — Z7901 Long term (current) use of anticoagulants: Secondary | ICD-10-CM | POA: Diagnosis not present

## 2016-12-10 DIAGNOSIS — I482 Chronic atrial fibrillation: Secondary | ICD-10-CM | POA: Diagnosis not present

## 2016-12-10 LAB — POCT INR: INR: 3.1

## 2016-12-18 ENCOUNTER — Telehealth: Payer: Self-pay | Admitting: Cardiovascular Disease

## 2016-12-18 NOTE — Telephone Encounter (Signed)
Spoke with pt, aware of lab results. 

## 2016-12-18 NOTE — Telephone Encounter (Signed)
New message       Pt states that he is returning a call to the nurse

## 2016-12-19 HISTORY — PX: OTHER SURGICAL HISTORY: SHX169

## 2016-12-21 ENCOUNTER — Ambulatory Visit: Payer: Medicare Other

## 2016-12-21 ENCOUNTER — Telehealth: Payer: Self-pay

## 2016-12-21 NOTE — Telephone Encounter (Signed)
Patient walked in office thought he had a B/P check visit today.Stated valsartan recently changed to candesartan 16 mg daily.Stated he is doing well no complaints.B/P 110/64 pulse 72.Advised to continue medications.Keep appointments as planned.

## 2017-01-01 ENCOUNTER — Ambulatory Visit (INDEPENDENT_AMBULATORY_CARE_PROVIDER_SITE_OTHER): Payer: Medicare Other | Admitting: Family Medicine

## 2017-01-01 ENCOUNTER — Encounter: Payer: Self-pay | Admitting: Family Medicine

## 2017-01-01 VITALS — BP 107/65 | HR 70 | Temp 97.6°F | Resp 16 | Ht 62.0 in | Wt 212.2 lb

## 2017-01-01 DIAGNOSIS — R188 Other ascites: Secondary | ICD-10-CM

## 2017-01-01 DIAGNOSIS — R740 Nonspecific elevation of levels of transaminase and lactic acid dehydrogenase [LDH]: Secondary | ICD-10-CM | POA: Diagnosis not present

## 2017-01-01 DIAGNOSIS — I5032 Chronic diastolic (congestive) heart failure: Secondary | ICD-10-CM

## 2017-01-01 DIAGNOSIS — N183 Chronic kidney disease, stage 3 unspecified: Secondary | ICD-10-CM

## 2017-01-01 DIAGNOSIS — I10 Essential (primary) hypertension: Secondary | ICD-10-CM

## 2017-01-01 DIAGNOSIS — R7401 Elevation of levels of liver transaminase levels: Secondary | ICD-10-CM

## 2017-01-01 DIAGNOSIS — E7800 Pure hypercholesterolemia, unspecified: Secondary | ICD-10-CM | POA: Diagnosis not present

## 2017-01-01 NOTE — Patient Instructions (Signed)
Make a LAB appointment for FASTING labs at your earliest convenience.
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
## 2017-01-02 ENCOUNTER — Other Ambulatory Visit (INDEPENDENT_AMBULATORY_CARE_PROVIDER_SITE_OTHER): Payer: Medicare Other

## 2017-01-02 DIAGNOSIS — E7800 Pure hypercholesterolemia, unspecified: Secondary | ICD-10-CM | POA: Diagnosis not present

## 2017-01-02 DIAGNOSIS — R188 Other ascites: Secondary | ICD-10-CM | POA: Diagnosis not present

## 2017-01-02 DIAGNOSIS — R740 Nonspecific elevation of levels of transaminase and lactic acid dehydrogenase [LDH]: Secondary | ICD-10-CM

## 2017-01-02 DIAGNOSIS — R7401 Elevation of levels of liver transaminase levels: Secondary | ICD-10-CM

## 2017-01-02 LAB — COMPREHENSIVE METABOLIC PANEL
ALK PHOS: 67 U/L (ref 39–117)
ALT: 12 U/L (ref 0–53)
AST: 17 U/L (ref 0–37)
Albumin: 4.3 g/dL (ref 3.5–5.2)
BILIRUBIN TOTAL: 0.7 mg/dL (ref 0.2–1.2)
BUN: 45 mg/dL — ABNORMAL HIGH (ref 6–23)
CO2: 30 meq/L (ref 19–32)
CREATININE: 1.89 mg/dL — AB (ref 0.40–1.50)
Calcium: 9.6 mg/dL (ref 8.4–10.5)
Chloride: 101 mEq/L (ref 96–112)
GFR: 37.17 mL/min — ABNORMAL LOW (ref >60.00)
GLUCOSE: 99 mg/dL (ref 70–99)
Potassium: 4.5 mEq/L (ref 3.5–5.1)
Sodium: 137 mEq/L (ref 135–145)
TOTAL PROTEIN: 5.8 g/dL — AB (ref 6.0–8.3)

## 2017-01-02 LAB — LIPID PANEL
CHOL/HDL RATIO: 3
CHOLESTEROL: 95 mg/dL (ref 0–200)
HDL: 32.4 mg/dL — ABNORMAL LOW (ref >39.00)
NONHDL: 63.03
TRIGLYCERIDES: 202 mg/dL — AB (ref 0.0–149.0)
VLDL: 40.4 mg/dL — ABNORMAL HIGH (ref 0.0–40.0)

## 2017-01-02 LAB — LDL CHOLESTEROL, DIRECT: Direct LDL: 34 mg/dL

## 2017-01-03 ENCOUNTER — Other Ambulatory Visit: Payer: Self-pay | Admitting: *Deleted

## 2017-01-03 DIAGNOSIS — N183 Chronic kidney disease, stage 3 unspecified: Secondary | ICD-10-CM

## 2017-01-07 ENCOUNTER — Ambulatory Visit (INDEPENDENT_AMBULATORY_CARE_PROVIDER_SITE_OTHER): Payer: Medicare Other | Admitting: Pharmacist Clinician (PhC)/ Clinical Pharmacy Specialist

## 2017-01-07 DIAGNOSIS — I4821 Permanent atrial fibrillation: Secondary | ICD-10-CM

## 2017-01-07 DIAGNOSIS — Z7901 Long term (current) use of anticoagulants: Secondary | ICD-10-CM | POA: Diagnosis not present

## 2017-01-07 DIAGNOSIS — I482 Chronic atrial fibrillation: Secondary | ICD-10-CM

## 2017-01-07 LAB — POCT INR: INR: 4.9

## 2017-01-08 ENCOUNTER — Ambulatory Visit (HOSPITAL_COMMUNITY)
Admission: RE | Admit: 2017-01-08 | Discharge: 2017-01-08 | Disposition: A | Discharge status: Home / Self Care | Payer: Medicare Other | Source: Ambulatory Visit | Attending: Family Medicine | Admitting: Family Medicine

## 2017-01-08 ENCOUNTER — Other Ambulatory Visit: Payer: Self-pay | Admitting: Family Medicine

## 2017-01-08 DIAGNOSIS — R188 Other ascites: Secondary | ICD-10-CM

## 2017-01-08 DIAGNOSIS — I272 Pulmonary hypertension, unspecified: Secondary | ICD-10-CM | POA: Insufficient documentation

## 2017-01-22 ENCOUNTER — Other Ambulatory Visit: Payer: Self-pay | Admitting: Cardiovascular Disease

## 2017-01-22 NOTE — Telephone Encounter (Signed)
Please review for refill, thanks ! 

## 2017-01-23 NOTE — Telephone Encounter (Signed)
This should be filled by his PCP.   

## 2017-01-25 ENCOUNTER — Ambulatory Visit (INDEPENDENT_AMBULATORY_CARE_PROVIDER_SITE_OTHER): Payer: Medicare Other | Admitting: Pharmacist Clinician (PhC)/ Clinical Pharmacy Specialist

## 2017-01-25 DIAGNOSIS — Z7901 Long term (current) use of anticoagulants: Secondary | ICD-10-CM

## 2017-01-25 DIAGNOSIS — I4821 Permanent atrial fibrillation: Secondary | ICD-10-CM

## 2017-01-25 DIAGNOSIS — I482 Chronic atrial fibrillation: Secondary | ICD-10-CM | POA: Diagnosis not present

## 2017-01-25 LAB — POCT INR: INR: 2.2

## 2017-01-25 NOTE — Telephone Encounter (Signed)
Allopurinol refill refused. Defer to PCP per Dr. Duke Salvia

## 2017-01-28 ENCOUNTER — Ambulatory Visit (INDEPENDENT_AMBULATORY_CARE_PROVIDER_SITE_OTHER): Payer: Medicare Other | Admitting: *Deleted

## 2017-01-28 DIAGNOSIS — I442 Atrioventricular block, complete: Secondary | ICD-10-CM | POA: Diagnosis not present

## 2017-01-28 DIAGNOSIS — Z95810 Presence of automatic (implantable) cardiac defibrillator: Secondary | ICD-10-CM

## 2017-01-28 NOTE — Progress Notes (Signed)
Remote ICD transmission.   

## 2017-01-30 ENCOUNTER — Encounter: Payer: Self-pay | Admitting: Cardiology

## 2017-01-30 LAB — CUP PACEART REMOTE DEVICE CHECK
Battery Remaining Longevity: 144 mo
Battery Remaining Percentage: 100 %
Brady Statistic RV Percent Paced: 100 %
Date Time Interrogation Session: 20180910085100
HighPow Impedance: 41 Ohm
Implantable Lead Location: 753860
Implantable Lead Serial Number: 331438
Implantable Pulse Generator Implant Date: 20180115
Lead Channel Setting Pacing Amplitude: 2.5 V
Lead Channel Setting Sensing Sensitivity: 0.5 mV
MDC IDC LEAD IMPLANT DT: 20000223
MDC IDC MSMT LEADCHNL RV IMPEDANCE VALUE: 1015 Ohm
MDC IDC MSMT LEADCHNL RV PACING THRESHOLD AMPLITUDE: 0.4 V
MDC IDC MSMT LEADCHNL RV PACING THRESHOLD PULSEWIDTH: 0.4 ms
MDC IDC PG SERIAL: 195683
MDC IDC SET LEADCHNL RV PACING PULSEWIDTH: 0.4 ms

## 2017-02-19 ENCOUNTER — Ambulatory Visit (INDEPENDENT_AMBULATORY_CARE_PROVIDER_SITE_OTHER): Payer: Medicare Other | Admitting: Cardiovascular Disease

## 2017-02-19 ENCOUNTER — Ambulatory Visit (INDEPENDENT_AMBULATORY_CARE_PROVIDER_SITE_OTHER): Payer: Medicare Other | Admitting: Pharmacist Clinician (PhC)/ Clinical Pharmacy Specialist

## 2017-02-19 ENCOUNTER — Encounter: Payer: Self-pay | Admitting: Cardiovascular Disease

## 2017-02-19 VITALS — BP 122/63 | HR 69 | Ht 62.0 in | Wt 208.4 lb

## 2017-02-19 DIAGNOSIS — I5032 Chronic diastolic (congestive) heart failure: Secondary | ICD-10-CM

## 2017-02-19 DIAGNOSIS — I482 Chronic atrial fibrillation, unspecified: Secondary | ICD-10-CM

## 2017-02-19 DIAGNOSIS — N183 Chronic kidney disease, stage 3 unspecified: Secondary | ICD-10-CM

## 2017-02-19 DIAGNOSIS — I442 Atrioventricular block, complete: Secondary | ICD-10-CM

## 2017-02-19 DIAGNOSIS — I4821 Permanent atrial fibrillation: Secondary | ICD-10-CM

## 2017-02-19 DIAGNOSIS — R600 Localized edema: Secondary | ICD-10-CM | POA: Diagnosis not present

## 2017-02-19 DIAGNOSIS — Z95810 Presence of automatic (implantable) cardiac defibrillator: Secondary | ICD-10-CM | POA: Diagnosis not present

## 2017-02-19 DIAGNOSIS — Z7901 Long term (current) use of anticoagulants: Secondary | ICD-10-CM

## 2017-02-19 LAB — POCT INR: INR: 2.1
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
## 2017-02-19 NOTE — Patient Instructions (Signed)
Medication Instructions:  Your physician recommends that you continue on your current medications as directed. Please refer to the Current Medication list given to you today.  Labwork: none  Testing/Procedures: none  Follow-Up: Your physician wants you to follow-up in: 4 month ov You will receive a reminder letter in the mail two months in advance. If you don't receive a letter, please call our office to schedule the follow-up appointment.  If you need a refill on your cardiac medications before your next appointment, please call your pharmacy.  

## 2017-02-26 ENCOUNTER — Other Ambulatory Visit: Payer: Self-pay | Admitting: Cardiovascular Disease

## 2017-02-26 ENCOUNTER — Other Ambulatory Visit: Payer: Self-pay | Admitting: Family Medicine

## 2017-02-26 NOTE — Telephone Encounter (Signed)
Please review for refill. Thanks!  

## 2017-03-12 ENCOUNTER — Encounter: Payer: Self-pay | Admitting: Family Medicine

## 2017-03-19 ENCOUNTER — Ambulatory Visit (INDEPENDENT_AMBULATORY_CARE_PROVIDER_SITE_OTHER): Payer: Medicare Other | Admitting: Pharmacist

## 2017-03-19 DIAGNOSIS — Z7901 Long term (current) use of anticoagulants: Secondary | ICD-10-CM

## 2017-03-19 DIAGNOSIS — I4821 Permanent atrial fibrillation: Secondary | ICD-10-CM

## 2017-03-19 DIAGNOSIS — I482 Chronic atrial fibrillation: Secondary | ICD-10-CM | POA: Diagnosis not present

## 2017-03-19 LAB — POCT INR: INR: 2.1

## 2017-03-30 ENCOUNTER — Other Ambulatory Visit: Payer: Self-pay | Admitting: Cardiovascular Disease

## 2017-04-01 NOTE — Telephone Encounter (Signed)
Please review for refill, thanks ! 

## 2017-04-01 NOTE — Telephone Encounter (Signed)
REFILL 

## 2017-04-17 ENCOUNTER — Ambulatory Visit (INDEPENDENT_AMBULATORY_CARE_PROVIDER_SITE_OTHER): Payer: Medicare Other | Admitting: Pharmacist

## 2017-04-17 DIAGNOSIS — Z7901 Long term (current) use of anticoagulants: Secondary | ICD-10-CM

## 2017-04-17 DIAGNOSIS — I482 Chronic atrial fibrillation: Secondary | ICD-10-CM | POA: Diagnosis not present

## 2017-04-17 DIAGNOSIS — I4821 Permanent atrial fibrillation: Secondary | ICD-10-CM

## 2017-04-17 LAB — POCT INR: INR: 2

## 2017-04-26 ENCOUNTER — Other Ambulatory Visit: Payer: Self-pay | Admitting: Internal Medicine

## 2017-04-26 ENCOUNTER — Other Ambulatory Visit: Payer: Self-pay | Admitting: Cardiovascular Disease

## 2017-04-29 ENCOUNTER — Ambulatory Visit (INDEPENDENT_AMBULATORY_CARE_PROVIDER_SITE_OTHER): Payer: Medicare Other | Admitting: *Deleted

## 2017-04-29 DIAGNOSIS — I469 Cardiac arrest, cause unspecified: Secondary | ICD-10-CM

## 2017-04-29 NOTE — Progress Notes (Signed)
Remote ICD transmission.   

## 2017-05-03 ENCOUNTER — Encounter: Payer: Self-pay | Admitting: Cardiology

## 2017-05-03 ENCOUNTER — Ambulatory Visit (INDEPENDENT_AMBULATORY_CARE_PROVIDER_SITE_OTHER): Payer: Medicare Other | Admitting: Family Medicine

## 2017-05-03 ENCOUNTER — Encounter: Payer: Self-pay | Admitting: Family Medicine

## 2017-05-03 ENCOUNTER — Other Ambulatory Visit: Payer: Self-pay

## 2017-05-03 VITALS — BP 110/70 | HR 69 | Temp 97.9°F | Resp 16 | Ht 62.0 in | Wt 206.0 lb

## 2017-05-03 DIAGNOSIS — I10 Essential (primary) hypertension: Secondary | ICD-10-CM

## 2017-05-03 DIAGNOSIS — N183 Chronic kidney disease, stage 3 unspecified: Secondary | ICD-10-CM

## 2017-05-03 DIAGNOSIS — E662 Morbid (severe) obesity with alveolar hypoventilation: Secondary | ICD-10-CM

## 2017-05-03 DIAGNOSIS — M1000 Idiopathic gout, unspecified site: Secondary | ICD-10-CM

## 2017-05-03 DIAGNOSIS — I272 Pulmonary hypertension, unspecified: Secondary | ICD-10-CM

## 2017-05-03 DIAGNOSIS — R609 Edema, unspecified: Secondary | ICD-10-CM

## 2017-05-03 LAB — BASIC METABOLIC PANEL
BUN: 46 mg/dL — ABNORMAL HIGH (ref 6–23)
CALCIUM: 9.6 mg/dL (ref 8.4–10.5)
CO2: 31 meq/L (ref 19–32)
Chloride: 99 mEq/L (ref 96–112)
Creatinine, Ser: 2.06 mg/dL — ABNORMAL HIGH (ref 0.40–1.50)
GFR: 33.62 mL/min — AB (ref >60.00)
GLUCOSE: 91 mg/dL (ref 70–99)
POTASSIUM: 4.7 meq/L (ref 3.5–5.1)
SODIUM: 135 meq/L (ref 135–145)

## 2017-05-03 LAB — URIC ACID: Uric Acid, Serum: 4.8 mg/dL (ref 4.0–7.8)
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
## 2017-05-04 LAB — CUP PACEART REMOTE DEVICE CHECK
Battery Remaining Longevity: 144 mo
Date Time Interrogation Session: 20181210095100
HighPow Impedance: 41 Ohm
Implantable Lead Implant Date: 20000223
Implantable Lead Location: 753860
Implantable Lead Model: 144
Implantable Pulse Generator Implant Date: 20180115
Lead Channel Pacing Threshold Pulse Width: 0.4 ms
Lead Channel Setting Pacing Pulse Width: 0.4 ms
MDC IDC LEAD SERIAL: 331438
MDC IDC MSMT BATTERY REMAINING PERCENTAGE: 100 %
MDC IDC MSMT LEADCHNL RV IMPEDANCE VALUE: 1025 Ohm
MDC IDC MSMT LEADCHNL RV PACING THRESHOLD AMPLITUDE: 0.4 V
MDC IDC PG SERIAL: 195683
MDC IDC SET LEADCHNL RV PACING AMPLITUDE: 2.5 V
MDC IDC SET LEADCHNL RV SENSING SENSITIVITY: 0.5 mV
MDC IDC STAT BRADY RV PERCENT PACED: 99 %

## 2017-05-15 ENCOUNTER — Ambulatory Visit (INDEPENDENT_AMBULATORY_CARE_PROVIDER_SITE_OTHER): Payer: Medicare Other | Admitting: Pharmacist Clinician (PhC)/ Clinical Pharmacy Specialist

## 2017-05-15 DIAGNOSIS — Z7901 Long term (current) use of anticoagulants: Secondary | ICD-10-CM | POA: Diagnosis not present

## 2017-05-15 DIAGNOSIS — I482 Chronic atrial fibrillation: Secondary | ICD-10-CM

## 2017-05-15 DIAGNOSIS — I4821 Permanent atrial fibrillation: Secondary | ICD-10-CM

## 2017-05-15 LAB — POCT INR: INR: 1.8

## 2017-05-15 NOTE — Patient Instructions (Signed)
Description   Take 1 tablet today Wednesday Dec 26, then increase dose to 1 tablet each Monday and Thursday, 1/2 tablet all other days.  Repeat INR in 3 weeks.

## 2017-05-23 ENCOUNTER — Other Ambulatory Visit: Payer: Self-pay | Admitting: Cardiovascular Disease

## 2017-05-23 ENCOUNTER — Other Ambulatory Visit: Payer: Self-pay | Admitting: Internal Medicine

## 2017-05-23 NOTE — Telephone Encounter (Signed)
Refill Request.  

## 2017-06-05 ENCOUNTER — Ambulatory Visit (INDEPENDENT_AMBULATORY_CARE_PROVIDER_SITE_OTHER): Payer: Medicare Other | Admitting: Pharmacist Clinician (PhC)/ Clinical Pharmacy Specialist

## 2017-06-05 DIAGNOSIS — Z7901 Long term (current) use of anticoagulants: Secondary | ICD-10-CM | POA: Diagnosis not present

## 2017-06-05 DIAGNOSIS — I482 Chronic atrial fibrillation: Secondary | ICD-10-CM | POA: Diagnosis not present

## 2017-06-05 DIAGNOSIS — I4821 Permanent atrial fibrillation: Secondary | ICD-10-CM

## 2017-06-05 LAB — POCT INR: INR: 2.7

## 2017-06-05 NOTE — Patient Instructions (Signed)
Description   Take 1 tablet each Monday and Thursday, 1/2 tablet all other days.  Repeat INR in 4 weeks.

## 2017-06-17 ENCOUNTER — Other Ambulatory Visit: Payer: Self-pay | Admitting: Internal Medicine

## 2017-06-17 ENCOUNTER — Other Ambulatory Visit: Payer: Self-pay | Admitting: Cardiovascular Disease

## 2017-06-17 NOTE — Telephone Encounter (Signed)
Please review for refill, Thanks !  

## 2017-06-17 NOTE — Telephone Encounter (Signed)
Rx request sent to pharmacy.  

## 2017-07-03 ENCOUNTER — Ambulatory Visit (INDEPENDENT_AMBULATORY_CARE_PROVIDER_SITE_OTHER): Payer: Medicare Other | Admitting: Pharmacist

## 2017-07-03 DIAGNOSIS — Z7901 Long term (current) use of anticoagulants: Secondary | ICD-10-CM

## 2017-07-03 DIAGNOSIS — I482 Chronic atrial fibrillation: Secondary | ICD-10-CM | POA: Diagnosis not present

## 2017-07-03 DIAGNOSIS — I4821 Permanent atrial fibrillation: Secondary | ICD-10-CM

## 2017-07-03 LAB — POCT INR: INR: 1.7

## 2017-07-18 ENCOUNTER — Ambulatory Visit (INDEPENDENT_AMBULATORY_CARE_PROVIDER_SITE_OTHER): Payer: Medicare Other | Admitting: Cardiovascular Disease

## 2017-07-18 ENCOUNTER — Ambulatory Visit (INDEPENDENT_AMBULATORY_CARE_PROVIDER_SITE_OTHER): Payer: Medicare Other | Admitting: Pharmacist Clinician (PhC)/ Clinical Pharmacy Specialist

## 2017-07-18 ENCOUNTER — Encounter: Payer: Self-pay | Admitting: Cardiovascular Disease

## 2017-07-18 VITALS — BP 132/64 | HR 70 | Ht 62.0 in | Wt 209.0 lb

## 2017-07-18 DIAGNOSIS — R600 Localized edema: Secondary | ICD-10-CM

## 2017-07-18 DIAGNOSIS — I5032 Chronic diastolic (congestive) heart failure: Secondary | ICD-10-CM

## 2017-07-18 DIAGNOSIS — I482 Chronic atrial fibrillation: Secondary | ICD-10-CM

## 2017-07-18 DIAGNOSIS — Z7901 Long term (current) use of anticoagulants: Secondary | ICD-10-CM | POA: Diagnosis not present

## 2017-07-18 DIAGNOSIS — I442 Atrioventricular block, complete: Secondary | ICD-10-CM | POA: Diagnosis not present

## 2017-07-18 DIAGNOSIS — I4821 Permanent atrial fibrillation: Secondary | ICD-10-CM

## 2017-07-18 LAB — POCT INR: INR: 2.7
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
## 2017-07-18 NOTE — Patient Instructions (Signed)

## 2017-07-29 ENCOUNTER — Ambulatory Visit (INDEPENDENT_AMBULATORY_CARE_PROVIDER_SITE_OTHER): Payer: Medicare Other | Admitting: *Deleted

## 2017-07-29 DIAGNOSIS — Z95810 Presence of automatic (implantable) cardiac defibrillator: Secondary | ICD-10-CM

## 2017-07-29 DIAGNOSIS — I469 Cardiac arrest, cause unspecified: Secondary | ICD-10-CM

## 2017-07-29 DIAGNOSIS — I442 Atrioventricular block, complete: Secondary | ICD-10-CM

## 2017-07-29 NOTE — Progress Notes (Signed)
Remote ICD transmission.   

## 2017-07-30 ENCOUNTER — Ambulatory Visit (INDEPENDENT_AMBULATORY_CARE_PROVIDER_SITE_OTHER): Payer: Medicare Other | Admitting: Internal Medicine

## 2017-07-30 ENCOUNTER — Encounter: Payer: Self-pay | Admitting: Internal Medicine

## 2017-07-30 VITALS — BP 130/70 | HR 70 | Ht 62.0 in | Wt 209.0 lb

## 2017-07-30 DIAGNOSIS — I442 Atrioventricular block, complete: Secondary | ICD-10-CM

## 2017-07-30 DIAGNOSIS — Z95810 Presence of automatic (implantable) cardiac defibrillator: Secondary | ICD-10-CM | POA: Diagnosis not present

## 2017-07-30 DIAGNOSIS — I469 Cardiac arrest, cause unspecified: Secondary | ICD-10-CM | POA: Diagnosis not present

## 2017-07-30 DIAGNOSIS — I5032 Chronic diastolic (congestive) heart failure: Secondary | ICD-10-CM

## 2017-07-30 LAB — CUP PACEART INCLINIC DEVICE CHECK
Date Time Interrogation Session: 20190312163524
Implantable Lead Implant Date: 20000223
Implantable Lead Location: 753860
Implantable Lead Model: 144
Implantable Lead Serial Number: 331438
MDC IDC PG IMPLANT DT: 20180115
Pulse Gen Serial Number: 195683

## 2017-07-30 NOTE — Patient Instructions (Addendum)
Medication Instructions:  Your physician recommends that you continue on your current medications as directed. Please refer to the Current Medication list given to you today.  Labwork:  CBC and BMP  Testing/Procedures: None ordered.  Follow-Up: Your physician recommends that you schedule a follow-up appointment in:  6 months with Amber or Renee    Remote monitoring is used to monitor your Pacemaker of ICD from home. This monitoring reduces the number of office visits required to check your device to one time per year. It allows [place] to keep an eye on the functioning of your device to ensure it is working properly. You are scheduled for a device check from home on 6/10 You may send your transmission at any time that day. If you have a wireless device, the transmission will be sent automatically. After your physician reviews your transmission, you will receive a postcard with your next transmission date.    Any Other Special Instructions Will Be Listed Below (If Applicable).     If you need a refill on your cardiac medications before your next appointment, please call your pharmacy.
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
## 2017-07-31 ENCOUNTER — Encounter: Payer: Self-pay | Admitting: Cardiology

## 2017-07-31 LAB — BASIC METABOLIC PANEL
BUN/Creatinine Ratio: 23 (ref 10–24)
BUN: 42 mg/dL — ABNORMAL HIGH (ref 8–27)
CO2: 21 mmol/L (ref 20–29)
CREATININE: 1.81 mg/dL — AB (ref 0.76–1.27)
Calcium: 10 mg/dL (ref 8.6–10.2)
Chloride: 101 mmol/L (ref 96–106)
GFR, EST AFRICAN AMERICAN: 41 mL/min/{1.73_m2} — AB (ref >59)
GFR, EST NON AFRICAN AMERICAN: 36 mL/min/{1.73_m2} — AB (ref >59)
Glucose: 99 mg/dL (ref 65–99)
POTASSIUM: 4.7 mmol/L (ref 3.5–5.2)
SODIUM: 141 mmol/L (ref 134–144)

## 2017-07-31 LAB — CBC WITH DIFFERENTIAL/PLATELET
BASOS: 0 %
Basophils Absolute: 0 10*3/uL (ref 0.0–0.2)
EOS (ABSOLUTE): 0.3 10*3/uL (ref 0.0–0.4)
EOS: 3 %
HEMATOCRIT: 35.9 % — AB (ref 37.5–51.0)
HEMOGLOBIN: 12.1 g/dL — AB (ref 13.0–17.7)
Immature Grans (Abs): 0 10*3/uL (ref 0.0–0.1)
Immature Granulocytes: 0 %
LYMPHS ABS: 2.2 10*3/uL (ref 0.7–3.1)
Lymphs: 29 %
MCH: 29.8 pg (ref 26.6–33.0)
MCHC: 33.7 g/dL (ref 31.5–35.7)
MCV: 88 fL (ref 79–97)
MONOCYTES: 9 %
MONOS ABS: 0.7 10*3/uL (ref 0.1–0.9)
NEUTROS ABS: 4.6 10*3/uL (ref 1.4–7.0)
Neutrophils: 59 %
Platelets: 187 10*3/uL (ref 150–379)
RBC: 4.06 x10E6/uL — AB (ref 4.14–5.80)
RDW: 13.7 % (ref 12.3–15.4)
WBC: 7.8 10*3/uL (ref 3.4–10.8)

## 2017-07-31 NOTE — Progress Notes (Signed)
Letter  

## 2017-08-14 ENCOUNTER — Ambulatory Visit (INDEPENDENT_AMBULATORY_CARE_PROVIDER_SITE_OTHER): Payer: Medicare Other | Admitting: Pharmacist Clinician (PhC)/ Clinical Pharmacy Specialist

## 2017-08-14 DIAGNOSIS — Z7901 Long term (current) use of anticoagulants: Secondary | ICD-10-CM | POA: Diagnosis not present

## 2017-08-14 DIAGNOSIS — I4821 Permanent atrial fibrillation: Secondary | ICD-10-CM

## 2017-08-14 DIAGNOSIS — I482 Chronic atrial fibrillation: Secondary | ICD-10-CM

## 2017-08-14 LAB — POCT INR: INR: 2.4

## 2017-08-14 NOTE — Patient Instructions (Signed)
Description   Continue taking 1 tablet each Monday and Thursday, 1/2 tablet all other days.  Repeat INR in 4 weeks        

## 2017-08-17 LAB — CUP PACEART REMOTE DEVICE CHECK
Date Time Interrogation Session: 20190311085000
HighPow Impedance: 45 Ohm
Implantable Lead Serial Number: 331438
Implantable Pulse Generator Implant Date: 20180115
Lead Channel Impedance Value: 1000 Ohm
Lead Channel Pacing Threshold Amplitude: 0.4 V
Lead Channel Setting Sensing Sensitivity: 0.5 mV
MDC IDC LEAD IMPLANT DT: 20000223
MDC IDC LEAD LOCATION: 753860
MDC IDC MSMT BATTERY REMAINING LONGEVITY: 144 mo
MDC IDC MSMT BATTERY REMAINING PERCENTAGE: 100 %
MDC IDC MSMT LEADCHNL RV PACING THRESHOLD PULSEWIDTH: 0.4 ms
MDC IDC SET LEADCHNL RV PACING AMPLITUDE: 2.5 V
MDC IDC SET LEADCHNL RV PACING PULSEWIDTH: 0.4 ms
MDC IDC STAT BRADY RV PERCENT PACED: 99 %
Pulse Gen Serial Number: 195683

## 2017-08-19 ENCOUNTER — Other Ambulatory Visit: Payer: Self-pay | Admitting: Family Medicine

## 2017-08-22 ENCOUNTER — Encounter: Payer: Self-pay | Admitting: Family Medicine

## 2017-09-02 ENCOUNTER — Encounter: Payer: Self-pay | Admitting: Family Medicine

## 2017-09-02 ENCOUNTER — Ambulatory Visit (INDEPENDENT_AMBULATORY_CARE_PROVIDER_SITE_OTHER): Payer: Medicare Other | Admitting: Family Medicine

## 2017-09-02 VITALS — BP 134/75 | HR 74 | Resp 16 | Wt 210.0 lb

## 2017-09-02 DIAGNOSIS — I50810 Right heart failure, unspecified: Secondary | ICD-10-CM | POA: Diagnosis not present

## 2017-09-02 DIAGNOSIS — R188 Other ascites: Secondary | ICD-10-CM

## 2017-09-02 DIAGNOSIS — N183 Chronic kidney disease, stage 3 unspecified: Secondary | ICD-10-CM

## 2017-09-02 DIAGNOSIS — I5022 Chronic systolic (congestive) heart failure: Secondary | ICD-10-CM | POA: Diagnosis not present

## 2017-09-02 DIAGNOSIS — E7800 Pure hypercholesterolemia, unspecified: Secondary | ICD-10-CM

## 2017-09-02 DIAGNOSIS — I10 Essential (primary) hypertension: Secondary | ICD-10-CM | POA: Diagnosis not present

## 2017-09-02 LAB — COMPREHENSIVE METABOLIC PANEL
ALT: 13 U/L (ref 0–53)
AST: 16 U/L (ref 0–37)
Albumin: 4.4 g/dL (ref 3.5–5.2)
Alkaline Phosphatase: 61 U/L (ref 39–117)
BUN: 30 mg/dL — AB (ref 6–23)
CHLORIDE: 97 meq/L (ref 96–112)
CO2: 30 meq/L (ref 19–32)
Calcium: 9.9 mg/dL (ref 8.4–10.5)
Creatinine, Ser: 1.73 mg/dL — ABNORMAL HIGH (ref 0.40–1.50)
GFR: 41.09 mL/min — ABNORMAL LOW (ref >60.00)
GLUCOSE: 82 mg/dL (ref 70–99)
POTASSIUM: 4.9 meq/L (ref 3.5–5.1)
SODIUM: 134 meq/L — AB (ref 135–145)
Total Bilirubin: 0.8 mg/dL (ref 0.2–1.2)
Total Protein: 6.4 g/dL (ref 6.0–8.3)

## 2017-09-02 LAB — LIPID PANEL
Cholesterol: 129 mg/dL (ref 0–200)
HDL: 38.1 mg/dL — ABNORMAL LOW (ref >39.00)
LDL CALC: 51 mg/dL (ref 0–99)
NONHDL: 90.74
Total CHOL/HDL Ratio: 3
Triglycerides: 199 mg/dL — ABNORMAL HIGH (ref 0.0–149.0)
VLDL: 39.8 mg/dL (ref 0.0–40.0)
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
## 2017-09-11 ENCOUNTER — Ambulatory Visit (INDEPENDENT_AMBULATORY_CARE_PROVIDER_SITE_OTHER): Payer: Medicare Other | Admitting: Pharmacist

## 2017-09-11 DIAGNOSIS — I482 Chronic atrial fibrillation: Secondary | ICD-10-CM | POA: Diagnosis not present

## 2017-09-11 DIAGNOSIS — I4821 Permanent atrial fibrillation: Secondary | ICD-10-CM

## 2017-09-11 DIAGNOSIS — Z7901 Long term (current) use of anticoagulants: Secondary | ICD-10-CM | POA: Diagnosis not present

## 2017-09-11 LAB — POCT INR: INR: 2.4

## 2017-09-11 NOTE — Patient Instructions (Signed)
Description   Continue taking 1 tablet each Monday and Thursday, 1/2 tablet all other days.  Repeat INR in 4 weeks

## 2017-10-09 ENCOUNTER — Ambulatory Visit (INDEPENDENT_AMBULATORY_CARE_PROVIDER_SITE_OTHER): Payer: Medicare Other | Admitting: Pharmacist

## 2017-10-09 DIAGNOSIS — I4821 Permanent atrial fibrillation: Secondary | ICD-10-CM

## 2017-10-09 DIAGNOSIS — I482 Chronic atrial fibrillation: Secondary | ICD-10-CM | POA: Diagnosis not present

## 2017-10-09 DIAGNOSIS — Z7901 Long term (current) use of anticoagulants: Secondary | ICD-10-CM

## 2017-10-09 LAB — POCT INR: INR: 2.4 (ref 2.0–3.0)

## 2017-10-16 ENCOUNTER — Other Ambulatory Visit: Payer: Self-pay | Admitting: Cardiovascular Disease

## 2017-10-28 ENCOUNTER — Ambulatory Visit (INDEPENDENT_AMBULATORY_CARE_PROVIDER_SITE_OTHER): Payer: Medicare Other | Admitting: *Deleted

## 2017-10-28 DIAGNOSIS — I469 Cardiac arrest, cause unspecified: Secondary | ICD-10-CM | POA: Diagnosis not present

## 2017-10-28 NOTE — Progress Notes (Signed)
Remote ICD transmission.   

## 2017-10-29 ENCOUNTER — Encounter: Payer: Self-pay | Admitting: Cardiology

## 2017-11-13 ENCOUNTER — Ambulatory Visit (INDEPENDENT_AMBULATORY_CARE_PROVIDER_SITE_OTHER): Payer: Medicare Other | Admitting: Pharmacist Clinician (PhC)/ Clinical Pharmacy Specialist

## 2017-11-13 DIAGNOSIS — I482 Chronic atrial fibrillation: Secondary | ICD-10-CM | POA: Diagnosis not present

## 2017-11-13 DIAGNOSIS — Z7901 Long term (current) use of anticoagulants: Secondary | ICD-10-CM | POA: Diagnosis not present

## 2017-11-13 DIAGNOSIS — I4821 Permanent atrial fibrillation: Secondary | ICD-10-CM

## 2017-11-13 LAB — POCT INR: INR: 2.3 (ref 2.0–3.0)

## 2017-11-13 NOTE — Patient Instructions (Signed)
Description   Continue taking 1 tablet each Monday and Thursday, 1/2 tablet all other days.  Repeat INR in 6 weeks

## 2017-11-15 ENCOUNTER — Other Ambulatory Visit: Payer: Self-pay

## 2017-11-18 MED ORDER — PANTOPRAZOLE SODIUM 40 MG PO TBEC: 40.0000 mg | DELAYED_RELEASE_TABLET | Freq: Two times a day (BID) | ORAL | 6 refills | Status: DC

## 2017-11-22 LAB — CUP PACEART REMOTE DEVICE CHECK
Battery Remaining Longevity: 144 mo
Battery Remaining Percentage: 100 %
Brady Statistic RV Percent Paced: 97 %
HighPow Impedance: 44 Ohm
Implantable Lead Location: 753860
Implantable Lead Serial Number: 331438
Implantable Pulse Generator Implant Date: 20180115
Lead Channel Impedance Value: 1053 Ohm
Lead Channel Setting Pacing Amplitude: 2.5 V
Lead Channel Setting Pacing Pulse Width: 0.4 ms
Lead Channel Setting Sensing Sensitivity: 0.5 mV
MDC IDC LEAD IMPLANT DT: 20000223
MDC IDC MSMT LEADCHNL RV PACING THRESHOLD AMPLITUDE: 0.5 V
MDC IDC MSMT LEADCHNL RV PACING THRESHOLD PULSEWIDTH: 0.4 ms
MDC IDC SESS DTM: 20190610085200
Pulse Gen Serial Number: 195683

## 2017-12-14 ENCOUNTER — Other Ambulatory Visit: Payer: Self-pay | Admitting: Cardiovascular Disease

## 2017-12-25 ENCOUNTER — Ambulatory Visit (INDEPENDENT_AMBULATORY_CARE_PROVIDER_SITE_OTHER): Payer: Medicare Other | Admitting: Pharmacist Clinician (PhC)/ Clinical Pharmacy Specialist

## 2017-12-25 DIAGNOSIS — Z7901 Long term (current) use of anticoagulants: Secondary | ICD-10-CM

## 2017-12-25 DIAGNOSIS — I482 Chronic atrial fibrillation: Secondary | ICD-10-CM | POA: Diagnosis not present

## 2017-12-25 DIAGNOSIS — I4821 Permanent atrial fibrillation: Secondary | ICD-10-CM

## 2017-12-25 LAB — POCT INR: INR: 3 (ref 2.0–3.0)

## 2018-01-02 ENCOUNTER — Encounter: Payer: Self-pay | Admitting: Family Medicine

## 2018-01-02 ENCOUNTER — Ambulatory Visit (INDEPENDENT_AMBULATORY_CARE_PROVIDER_SITE_OTHER): Payer: Medicare Other | Admitting: Family Medicine

## 2018-01-02 VITALS — BP 129/72 | HR 69 | Temp 97.5°F | Resp 16 | Ht 62.0 in | Wt 212.1 lb

## 2018-01-02 DIAGNOSIS — I5022 Chronic systolic (congestive) heart failure: Secondary | ICD-10-CM

## 2018-01-02 DIAGNOSIS — I4891 Unspecified atrial fibrillation: Secondary | ICD-10-CM

## 2018-01-02 DIAGNOSIS — R252 Cramp and spasm: Secondary | ICD-10-CM | POA: Diagnosis not present

## 2018-01-02 DIAGNOSIS — N183 Chronic kidney disease, stage 3 unspecified: Secondary | ICD-10-CM

## 2018-01-02 DIAGNOSIS — I10 Essential (primary) hypertension: Secondary | ICD-10-CM | POA: Diagnosis not present

## 2018-01-02 DIAGNOSIS — I50812 Chronic right heart failure: Secondary | ICD-10-CM

## 2018-01-02 DIAGNOSIS — I5032 Chronic diastolic (congestive) heart failure: Secondary | ICD-10-CM

## 2018-01-02 LAB — BASIC METABOLIC PANEL
BUN: 40 mg/dL — AB (ref 6–23)
CALCIUM: 10 mg/dL (ref 8.4–10.5)
CO2: 32 mEq/L (ref 19–32)
Chloride: 98 mEq/L (ref 96–112)
Creatinine, Ser: 2.06 mg/dL — ABNORMAL HIGH (ref 0.40–1.50)
GFR: 33.56 mL/min — AB (ref >60.00)
GLUCOSE: 96 mg/dL (ref 70–99)
Potassium: 4.8 mEq/L (ref 3.5–5.1)
Sodium: 136 mEq/L (ref 135–145)

## 2018-01-02 LAB — MAGNESIUM: Magnesium: 2.4 mg/dL (ref 1.5–2.5)
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
## 2018-01-02 NOTE — Patient Instructions (Signed)
Take ONE of your metolazone 2.5mg  tabs today.  Otherwise, no medication changes recommended at this time.  Try to avoid foods with excessive sodium!

## 2018-01-21 ENCOUNTER — Other Ambulatory Visit: Payer: Self-pay | Admitting: Cardiovascular Disease

## 2018-01-22 ENCOUNTER — Other Ambulatory Visit: Payer: Self-pay

## 2018-01-22 ENCOUNTER — Ambulatory Visit (INDEPENDENT_AMBULATORY_CARE_PROVIDER_SITE_OTHER): Payer: Medicare Other | Admitting: Pharmacist

## 2018-01-22 ENCOUNTER — Encounter: Payer: Self-pay | Admitting: Cardiovascular Disease

## 2018-01-22 ENCOUNTER — Ambulatory Visit (INDEPENDENT_AMBULATORY_CARE_PROVIDER_SITE_OTHER): Payer: Medicare Other | Admitting: Cardiovascular Disease

## 2018-01-22 VITALS — BP 126/70 | HR 70 | Ht 62.0 in | Wt 213.0 lb

## 2018-01-22 DIAGNOSIS — I10 Essential (primary) hypertension: Secondary | ICD-10-CM | POA: Diagnosis not present

## 2018-01-22 DIAGNOSIS — I482 Chronic atrial fibrillation: Secondary | ICD-10-CM | POA: Diagnosis not present

## 2018-01-22 DIAGNOSIS — I5032 Chronic diastolic (congestive) heart failure: Secondary | ICD-10-CM

## 2018-01-22 DIAGNOSIS — Z7901 Long term (current) use of anticoagulants: Secondary | ICD-10-CM

## 2018-01-22 DIAGNOSIS — I4821 Permanent atrial fibrillation: Secondary | ICD-10-CM

## 2018-01-22 DIAGNOSIS — E7800 Pure hypercholesterolemia, unspecified: Secondary | ICD-10-CM

## 2018-01-22 DIAGNOSIS — R600 Localized edema: Secondary | ICD-10-CM | POA: Diagnosis not present

## 2018-01-22 LAB — POCT INR: INR: 2.7 (ref 2.0–3.0)

## 2018-01-22 MED ORDER — CANDESARTAN CILEXETIL 16 MG PO TABS: 16.0000 mg | ORAL_TABLET | Freq: Every day | ORAL | 3 refills | Status: DC

## 2018-01-22 NOTE — Telephone Encounter (Signed)
This encounter was created in error - please disregard.

## 2018-01-22 NOTE — Addendum Note (Signed)
Addended by: Adline Peals on: 01/22/2018 11:34 AM   Modules accepted: Level of Service, SmartSet

## 2018-01-22 NOTE — Telephone Encounter (Signed)
Opened in error
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
## 2018-01-22 NOTE — Patient Instructions (Signed)
Medication Instructions:  °Your physician recommends that you continue on your current medications as directed. Please refer to the Current Medication list given to you today.  ° °Labwork: °NONE ° °Testing/Procedures: °NONE ° °Follow-Up: °Your physician wants you to follow-up in: 6 MONTHS  You will receive a reminder letter in the mail two months in advance. If you don't receive a letter, please call our office to schedule the follow-up appointment. ° °If you need a refill on your cardiac medications before your next appointment, please call your pharmacy. °

## 2018-01-27 ENCOUNTER — Ambulatory Visit (INDEPENDENT_AMBULATORY_CARE_PROVIDER_SITE_OTHER): Payer: Medicare Other | Admitting: *Deleted

## 2018-01-27 ENCOUNTER — Encounter: Payer: Self-pay | Admitting: Cardiology

## 2018-01-27 DIAGNOSIS — I469 Cardiac arrest, cause unspecified: Secondary | ICD-10-CM
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
## 2018-01-27 NOTE — Progress Notes (Signed)
Remote ICD transmission.   

## 2018-01-30 ENCOUNTER — Ambulatory Visit (INDEPENDENT_AMBULATORY_CARE_PROVIDER_SITE_OTHER): Payer: Medicare Other | Admitting: Nurse Practitioner

## 2018-01-30 VITALS — BP 114/80 | Ht 62.0 in | Wt 212.0 lb

## 2018-01-30 DIAGNOSIS — I5032 Chronic diastolic (congestive) heart failure: Secondary | ICD-10-CM

## 2018-01-30 DIAGNOSIS — I482 Chronic atrial fibrillation: Secondary | ICD-10-CM | POA: Diagnosis not present

## 2018-01-30 DIAGNOSIS — I4821 Permanent atrial fibrillation: Secondary | ICD-10-CM

## 2018-01-30 DIAGNOSIS — I469 Cardiac arrest, cause unspecified: Secondary | ICD-10-CM | POA: Diagnosis not present

## 2018-01-30 LAB — BASIC METABOLIC PANEL
BUN / CREAT RATIO: 19 (ref 10–24)
BUN: 40 mg/dL — ABNORMAL HIGH (ref 8–27)
CHLORIDE: 95 mmol/L — AB (ref 96–106)
CO2: 24 mmol/L (ref 20–29)
Calcium: 10.2 mg/dL (ref 8.6–10.2)
Creatinine, Ser: 2.08 mg/dL — ABNORMAL HIGH (ref 0.76–1.27)
GFR calc non Af Amer: 30 mL/min/{1.73_m2} — ABNORMAL LOW (ref >59)
GFR, EST AFRICAN AMERICAN: 35 mL/min/{1.73_m2} — AB (ref >59)
GLUCOSE: 76 mg/dL (ref 65–99)
Potassium: 4.9 mmol/L (ref 3.5–5.2)
SODIUM: 136 mmol/L (ref 134–144)

## 2018-01-30 LAB — CUP PACEART INCLINIC DEVICE CHECK
Date Time Interrogation Session: 20190912100505
Implantable Lead Implant Date: 20000223
Implantable Lead Location: 753860
MDC IDC LEAD SERIAL: 331438
MDC IDC PG IMPLANT DT: 20180115
Pulse Gen Serial Number: 195683

## 2018-01-30 LAB — CBC
HEMATOCRIT: 35.7 % — AB (ref 37.5–51.0)
HEMOGLOBIN: 12.3 g/dL — AB (ref 13.0–17.7)
MCH: 30.4 pg (ref 26.6–33.0)
MCHC: 34.5 g/dL (ref 31.5–35.7)
MCV: 88 fL (ref 79–97)
Platelets: 211 10*3/uL (ref 150–450)
RBC: 4.04 x10E6/uL — ABNORMAL LOW (ref 4.14–5.80)
RDW: 12.9 % (ref 12.3–15.4)
WBC: 7 10*3/uL (ref 3.4–10.8)

## 2018-01-30 LAB — MAGNESIUM: Magnesium: 2.2 mg/dL (ref 1.6–2.3)

## 2018-01-30 NOTE — Patient Instructions (Addendum)
Medication Instructions:   Your physician recommends that you continue on your current medications as directed. Please refer to the Current Medication list given to you today.   If you need a refill on your cardiac medications before your next appointment, please call your pharmacy.  Labwork:  CBC BMET AND MAG   Testing/Procedures: NONE ORDERED  TODAY   Follow-Up:  Your physician wants you to follow-up in: ONE YEAR WITH Graciela Husbands  You will receive a reminder letter in the mail two months in advance. If you don't receive a letter, please call our office to schedule the follow-up appointment.    Remote monitoring is used to monitor your Pacemaker of ICD from home. This monitoring reduces the number of office visits required to check your device to one time per year. It allows [place] to keep an eye on the functioning of your device to ensure it is working properly. You are scheduled for a device check from home on . 04-28-18 You may send your transmission at any time that day. If you have a wireless device, the transmission will be sent automatically. After your physician reviews your transmission, you will receive a postcard with your next transmission date.     Any Other Special Instructions Will Be Listed Below (If Applicable).

## 2018-02-15 LAB — CUP PACEART REMOTE DEVICE CHECK
Battery Remaining Longevity: 144 mo
Battery Remaining Percentage: 100 %
HighPow Impedance: 40 Ohm
Implantable Lead Implant Date: 20000223
Implantable Lead Location: 753860
Implantable Lead Serial Number: 331438
Implantable Pulse Generator Implant Date: 20180115
Lead Channel Pacing Threshold Amplitude: 0.5 V
Lead Channel Setting Sensing Sensitivity: 0.5 mV
MDC IDC MSMT LEADCHNL RV IMPEDANCE VALUE: 970 Ohm
MDC IDC MSMT LEADCHNL RV PACING THRESHOLD PULSEWIDTH: 0.4 ms
MDC IDC SESS DTM: 20190909085100
MDC IDC SET LEADCHNL RV PACING AMPLITUDE: 2.5 V
MDC IDC SET LEADCHNL RV PACING PULSEWIDTH: 0.4 ms
MDC IDC STAT BRADY RV PERCENT PACED: 98 %
Pulse Gen Serial Number: 195683

## 2018-03-05 ENCOUNTER — Ambulatory Visit (INDEPENDENT_AMBULATORY_CARE_PROVIDER_SITE_OTHER): Payer: Medicare Other | Admitting: Pharmacist

## 2018-03-05 DIAGNOSIS — I4821 Permanent atrial fibrillation: Secondary | ICD-10-CM

## 2018-03-05 DIAGNOSIS — Z7901 Long term (current) use of anticoagulants: Secondary | ICD-10-CM

## 2018-03-05 LAB — POCT INR: INR: 2.2 (ref 2.0–3.0)

## 2018-03-17 ENCOUNTER — Other Ambulatory Visit: Payer: Self-pay | Admitting: Cardiovascular Disease

## 2018-03-17 ENCOUNTER — Other Ambulatory Visit: Payer: Self-pay | Admitting: Family Medicine

## 2018-03-18 ENCOUNTER — Other Ambulatory Visit: Payer: Self-pay

## 2018-03-18 MED ORDER — POTASSIUM CHLORIDE ER 10 MEQ PO CPCR: ORAL_CAPSULE | ORAL | 11 refills | Status: DC

## 2018-03-19 ENCOUNTER — Other Ambulatory Visit: Payer: Self-pay | Admitting: Pharmacist Clinician (PhC)/ Clinical Pharmacy Specialist

## 2018-03-19 MED ORDER — WARFARIN SODIUM 5 MG PO TABS: ORAL_TABLET | ORAL | 1 refills | Status: DC

## 2018-04-16 ENCOUNTER — Ambulatory Visit (INDEPENDENT_AMBULATORY_CARE_PROVIDER_SITE_OTHER): Payer: Medicare Other | Admitting: Pharmacist

## 2018-04-16 DIAGNOSIS — Z7901 Long term (current) use of anticoagulants: Secondary | ICD-10-CM | POA: Diagnosis not present

## 2018-04-16 DIAGNOSIS — I4821 Permanent atrial fibrillation: Secondary | ICD-10-CM | POA: Diagnosis not present

## 2018-04-16 LAB — POCT INR: INR: 1.8 — AB (ref 2.0–3.0)

## 2018-04-28 ENCOUNTER — Ambulatory Visit (INDEPENDENT_AMBULATORY_CARE_PROVIDER_SITE_OTHER): Payer: Medicare Other

## 2018-04-28 DIAGNOSIS — I442 Atrioventricular block, complete: Secondary | ICD-10-CM

## 2018-04-28 DIAGNOSIS — I5032 Chronic diastolic (congestive) heart failure: Secondary | ICD-10-CM | POA: Diagnosis not present

## 2018-04-29 NOTE — Progress Notes (Signed)
Remote ICD transmission.   

## 2018-05-05 ENCOUNTER — Ambulatory Visit (INDEPENDENT_AMBULATORY_CARE_PROVIDER_SITE_OTHER): Payer: Medicare Other | Admitting: Family Medicine

## 2018-05-05 ENCOUNTER — Encounter: Payer: Self-pay | Admitting: Family Medicine

## 2018-05-05 VITALS — BP 107/56 | HR 71 | Temp 98.3°F | Resp 16 | Ht 62.0 in | Wt 212.1 lb

## 2018-05-05 DIAGNOSIS — N183 Chronic kidney disease, stage 3 unspecified: Secondary | ICD-10-CM

## 2018-05-05 DIAGNOSIS — I482 Chronic atrial fibrillation, unspecified: Secondary | ICD-10-CM | POA: Diagnosis not present

## 2018-05-05 DIAGNOSIS — I10 Essential (primary) hypertension: Secondary | ICD-10-CM | POA: Diagnosis not present

## 2018-05-05 DIAGNOSIS — I872 Venous insufficiency (chronic) (peripheral): Secondary | ICD-10-CM | POA: Diagnosis not present

## 2018-05-05 DIAGNOSIS — E669 Obesity, unspecified: Secondary | ICD-10-CM

## 2018-05-05 DIAGNOSIS — I442 Atrioventricular block, complete: Secondary | ICD-10-CM | POA: Diagnosis not present

## 2018-05-05 DIAGNOSIS — I5032 Chronic diastolic (congestive) heart failure: Secondary | ICD-10-CM

## 2018-05-05 DIAGNOSIS — Z0000 Encounter for general adult medical examination without abnormal findings: Secondary | ICD-10-CM

## 2018-05-05 LAB — BASIC METABOLIC PANEL
BUN: 44 mg/dL — ABNORMAL HIGH (ref 6–23)
CALCIUM: 10 mg/dL (ref 8.4–10.5)
CHLORIDE: 98 meq/L (ref 96–112)
CO2: 29 meq/L (ref 19–32)
Creatinine, Ser: 2 mg/dL — ABNORMAL HIGH (ref 0.40–1.50)
GFR: 34.7 mL/min — ABNORMAL LOW (ref >60.00)
Glucose, Bld: 95 mg/dL (ref 70–99)
POTASSIUM: 4.7 meq/L (ref 3.5–5.1)
SODIUM: 135 meq/L (ref 135–145)
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
## 2018-05-15 ENCOUNTER — Other Ambulatory Visit: Payer: Self-pay | Admitting: Internal Medicine

## 2018-05-16 ENCOUNTER — Other Ambulatory Visit: Payer: Self-pay | Admitting: *Deleted

## 2018-05-16 MED ORDER — PANTOPRAZOLE SODIUM 40 MG PO TBEC: 40.0000 mg | DELAYED_RELEASE_TABLET | Freq: Two times a day (BID) | ORAL | 1 refills | Status: DC

## 2018-05-28 ENCOUNTER — Ambulatory Visit (INDEPENDENT_AMBULATORY_CARE_PROVIDER_SITE_OTHER): Payer: Medicare Other | Admitting: Pharmacist

## 2018-05-28 ENCOUNTER — Other Ambulatory Visit: Payer: Self-pay | Admitting: *Deleted

## 2018-05-28 ENCOUNTER — Other Ambulatory Visit: Payer: Self-pay

## 2018-05-28 DIAGNOSIS — I4821 Permanent atrial fibrillation: Secondary | ICD-10-CM | POA: Diagnosis not present

## 2018-05-28 DIAGNOSIS — Z7901 Long term (current) use of anticoagulants: Secondary | ICD-10-CM | POA: Diagnosis not present

## 2018-05-28 LAB — POCT INR: INR: 1.9 — AB (ref 2.0–3.0)

## 2018-05-28 MED ORDER — SIMVASTATIN 20 MG PO TABS: ORAL_TABLET | ORAL | 11 refills | Status: DC

## 2018-05-28 MED ORDER — SIMVASTATIN 20 MG PO TABS: ORAL_TABLET | ORAL | 2 refills | Status: DC

## 2018-05-28 NOTE — Patient Instructions (Signed)
Take 1 tablet today, then continue taking 1 tablet each Monday and Thursday, 1/2 tablet all other days. Repeat INR in 4 weeks

## 2018-06-12 LAB — CUP PACEART REMOTE DEVICE CHECK
Battery Remaining Longevity: 144 mo
Brady Statistic RV Percent Paced: 99 %
Date Time Interrogation Session: 20191209095100
HighPow Impedance: 43 Ohm
Implantable Lead Implant Date: 20000223
Implantable Lead Location: 753860
Implantable Lead Model: 144
Implantable Pulse Generator Implant Date: 20180115
Lead Channel Pacing Threshold Pulse Width: 0.4 ms
Lead Channel Setting Sensing Sensitivity: 0.5 mV
MDC IDC LEAD SERIAL: 331438
MDC IDC MSMT BATTERY REMAINING PERCENTAGE: 100 %
MDC IDC MSMT LEADCHNL RV IMPEDANCE VALUE: 1033 Ohm
MDC IDC MSMT LEADCHNL RV PACING THRESHOLD AMPLITUDE: 0.5 V
MDC IDC PG SERIAL: 195683
MDC IDC SET LEADCHNL RV PACING AMPLITUDE: 2.5 V
MDC IDC SET LEADCHNL RV PACING PULSEWIDTH: 0.4 ms

## 2018-06-25 ENCOUNTER — Ambulatory Visit (INDEPENDENT_AMBULATORY_CARE_PROVIDER_SITE_OTHER): Payer: Medicare Other | Admitting: Pharmacist

## 2018-06-25 DIAGNOSIS — I4821 Permanent atrial fibrillation: Secondary | ICD-10-CM | POA: Diagnosis not present

## 2018-06-25 DIAGNOSIS — Z7901 Long term (current) use of anticoagulants: Secondary | ICD-10-CM | POA: Diagnosis not present

## 2018-06-25 LAB — POCT INR: INR: 2.3 (ref 2.0–3.0)

## 2018-07-28 ENCOUNTER — Ambulatory Visit (INDEPENDENT_AMBULATORY_CARE_PROVIDER_SITE_OTHER): Payer: Medicare Other | Admitting: *Deleted

## 2018-07-28 DIAGNOSIS — I442 Atrioventricular block, complete: Secondary | ICD-10-CM

## 2018-07-28 DIAGNOSIS — I5032 Chronic diastolic (congestive) heart failure: Secondary | ICD-10-CM

## 2018-07-29 LAB — CUP PACEART REMOTE DEVICE CHECK
Battery Remaining Longevity: 144 mo
Battery Remaining Percentage: 100 %
Brady Statistic RV Percent Paced: 99 %
Date Time Interrogation Session: 20200309085100
HighPow Impedance: 43 Ohm
Implantable Lead Implant Date: 20000223
Implantable Lead Location: 753860
Implantable Lead Model: 144
Implantable Lead Serial Number: 331438
Implantable Pulse Generator Implant Date: 20180115
Lead Channel Impedance Value: 990 Ohm
Lead Channel Pacing Threshold Pulse Width: 0.4 ms
Lead Channel Setting Pacing Amplitude: 2.5 V
Lead Channel Setting Pacing Pulse Width: 0.4 ms
Lead Channel Setting Sensing Sensitivity: 0.5 mV
MDC IDC MSMT LEADCHNL RV PACING THRESHOLD AMPLITUDE: 0.5 V
MDC IDC PG SERIAL: 195683

## 2018-08-05 NOTE — Progress Notes (Signed)
Remote ICD transmission.   

## 2018-08-08 ENCOUNTER — Telehealth: Payer: Self-pay | Admitting: *Deleted

## 2018-08-08 NOTE — Telephone Encounter (Signed)
Left message to call back regarding appointment on Tuesday

## 2018-08-11 NOTE — Telephone Encounter (Signed)
I will do a phone visit with him tomorrow.  Please call him to arrange for drive up INR check.

## 2018-08-11 NOTE — Telephone Encounter (Signed)
We need to know if appt with DR Duke Salvia will change or not.  If pt seen DR Duke Salvia , we will check INR during clinic visit.  If patient not seen DR Duke Salvia, he can come to drive by coumadin check at [facility]. MUST have appt (not walk-ins). We have limited space available.   Please let Coumadin Clinic know if he will see Dr Duke Salvia or not. We

## 2018-08-11 NOTE — Telephone Encounter (Signed)
PT WOULD LIKE TO KEEP TOMORROW'S APPT PER PT FEELS FINE BUT HAS CONCERNS BECAUSE IS ALSO SCHEDULED FOR INR CHECK AND INR DOES FLUCTUATES AT TIMES WILL FORWARD TO DR [facility] AND [facility] FOR REVIEW AND RECOMMENDATIONS .Zack Seal

## 2018-08-11 NOTE — Telephone Encounter (Signed)
° °  Patient returning call Should patient keep coumadin and OV appointment for 3/24

## 2018-08-11 NOTE — Telephone Encounter (Signed)
Pt aware Dr Duke Salvia will call pt and also  pt has appt for Thursday for drive thru cc [facility]

## 2018-08-11 NOTE — Telephone Encounter (Signed)
Pt scheduled for drive-thru coumadin clinic at [facility] on Th 3/26

## 2018-08-12 ENCOUNTER — Encounter: Payer: Self-pay | Admitting: Cardiovascular Disease

## 2018-08-12 ENCOUNTER — Telehealth (INDEPENDENT_AMBULATORY_CARE_PROVIDER_SITE_OTHER): Payer: Medicare Other | Admitting: Cardiovascular Disease

## 2018-08-12 ENCOUNTER — Telehealth: Payer: Self-pay

## 2018-08-12 DIAGNOSIS — I442 Atrioventricular block, complete: Secondary | ICD-10-CM

## 2018-08-12 DIAGNOSIS — I4821 Permanent atrial fibrillation: Secondary | ICD-10-CM | POA: Diagnosis not present

## 2018-08-12 DIAGNOSIS — I110 Hypertensive heart disease with heart failure: Secondary | ICD-10-CM | POA: Diagnosis not present

## 2018-08-12 DIAGNOSIS — I5032 Chronic diastolic (congestive) heart failure: Secondary | ICD-10-CM

## 2018-08-12 NOTE — Telephone Encounter (Signed)

## 2018-08-12 NOTE — Patient Instructions (Signed)
Medication Instructions:  Your physician recommends that you continue on your current medications as directed. Please refer to the Current Medication list given to you today.  If you need a refill on your cardiac medications before your next appointment, please call your pharmacy.   Lab work: NONE   Testing/Procedures: NONE  Follow-Up: At [facility], you and your health needs are our priority.  As part of our continuing mission to provide you with exceptional heart care, we have created designated Provider Care Teams.  These Care Teams include your primary Cardiologist (physician) and Advanced Practice Providers (APPs -  Physician Assistants and Nurse Practitioners) who all work together to provide you with the care you need, when you need it. You will need a follow up appointment in 6 months.  Please call our office 2 months in advance to schedule this appointment.  You may see DR [facility] or one of the following Advanced Practice Providers on your designated Care Team:   Corine Shelter, PA-C [place], [place] . Marjie Skiff, PA-C  KEEP COUMADIN CHECK
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
## 2018-08-14 ENCOUNTER — Other Ambulatory Visit: Payer: Self-pay

## 2018-08-14 ENCOUNTER — Ambulatory Visit (INDEPENDENT_AMBULATORY_CARE_PROVIDER_SITE_OTHER): Payer: Medicare Other | Admitting: Pharmacist

## 2018-08-14 DIAGNOSIS — I4821 Permanent atrial fibrillation: Secondary | ICD-10-CM

## 2018-08-14 DIAGNOSIS — Z7901 Long term (current) use of anticoagulants: Secondary | ICD-10-CM

## 2018-08-14 LAB — POCT INR: INR: 2 (ref 2.0–3.0)

## 2018-09-04 ENCOUNTER — Ambulatory Visit: Payer: Medicare Other | Admitting: Family Medicine

## 2018-09-18 ENCOUNTER — Other Ambulatory Visit: Payer: Self-pay | Admitting: Cardiovascular Disease

## 2018-09-18 NOTE — Telephone Encounter (Signed)
Torsemide refilled

## 2018-10-06 ENCOUNTER — Ambulatory Visit: Payer: Medicare Other | Admitting: Family Medicine

## 2018-10-07 ENCOUNTER — Telehealth: Payer: Self-pay

## 2018-10-07 NOTE — Telephone Encounter (Signed)

## 2018-10-09 ENCOUNTER — Other Ambulatory Visit: Payer: Self-pay

## 2018-10-09 ENCOUNTER — Ambulatory Visit (INDEPENDENT_AMBULATORY_CARE_PROVIDER_SITE_OTHER): Payer: Medicare Other | Admitting: Pharmacist

## 2018-10-09 DIAGNOSIS — Z7901 Long term (current) use of anticoagulants: Secondary | ICD-10-CM | POA: Diagnosis not present

## 2018-10-09 DIAGNOSIS — I4821 Permanent atrial fibrillation: Secondary | ICD-10-CM

## 2018-10-09 LAB — POCT INR: INR: 2.4 (ref 2.0–3.0)

## 2018-10-27 ENCOUNTER — Ambulatory Visit (INDEPENDENT_AMBULATORY_CARE_PROVIDER_SITE_OTHER): Payer: Medicare Other | Admitting: *Deleted

## 2018-10-27 DIAGNOSIS — I469 Cardiac arrest, cause unspecified: Secondary | ICD-10-CM

## 2018-10-27 DIAGNOSIS — I5032 Chronic diastolic (congestive) heart failure: Secondary | ICD-10-CM

## 2018-10-27 LAB — CUP PACEART REMOTE DEVICE CHECK
Battery Remaining Longevity: 144 mo
Battery Remaining Percentage: 100 %
Brady Statistic RV Percent Paced: 99 %
Date Time Interrogation Session: 20200608085200
HighPow Impedance: 43 Ohm
Implantable Lead Implant Date: 20000223
Implantable Lead Location: 753860
Implantable Lead Model: 144
Implantable Lead Serial Number: 331438
Implantable Pulse Generator Implant Date: 20180115
Lead Channel Impedance Value: 1014 Ohm
Lead Channel Pacing Threshold Amplitude: 0.5 V
Lead Channel Pacing Threshold Pulse Width: 0.4 ms
Lead Channel Setting Pacing Amplitude: 2.5 V
Lead Channel Setting Pacing Pulse Width: 0.4 ms
Lead Channel Setting Sensing Sensitivity: 0.5 mV
Pulse Gen Serial Number: 195683

## 2018-11-04 NOTE — Progress Notes (Signed)
Remote ICD transmission.   

## 2018-11-19 ENCOUNTER — Encounter: Payer: Self-pay | Admitting: Family Medicine

## 2018-11-19 ENCOUNTER — Other Ambulatory Visit: Payer: Self-pay

## 2018-11-19 ENCOUNTER — Ambulatory Visit (INDEPENDENT_AMBULATORY_CARE_PROVIDER_SITE_OTHER): Payer: Medicare Other | Admitting: Family Medicine

## 2018-11-19 VITALS — BP 110/70 | HR 72 | Temp 97.9°F | Resp 16 | Ht 62.0 in | Wt 207.2 lb

## 2018-11-19 DIAGNOSIS — R609 Edema, unspecified: Secondary | ICD-10-CM | POA: Diagnosis not present

## 2018-11-19 DIAGNOSIS — H9190 Unspecified hearing loss, unspecified ear: Secondary | ICD-10-CM | POA: Diagnosis not present

## 2018-11-19 DIAGNOSIS — I872 Venous insufficiency (chronic) (peripheral): Secondary | ICD-10-CM

## 2018-11-19 DIAGNOSIS — N183 Chronic kidney disease, stage 3 unspecified: Secondary | ICD-10-CM

## 2018-11-19 DIAGNOSIS — I5032 Chronic diastolic (congestive) heart failure: Secondary | ICD-10-CM | POA: Diagnosis not present

## 2018-11-19 LAB — COMPREHENSIVE METABOLIC PANEL
ALT: 13 U/L (ref 0–53)
AST: 16 U/L (ref 0–37)
Albumin: 4.3 g/dL (ref 3.5–5.2)
Alkaline Phosphatase: 64 U/L (ref 39–117)
BUN: 36 mg/dL — ABNORMAL HIGH (ref 6–23)
CO2: 30 mEq/L (ref 19–32)
Calcium: 9.9 mg/dL (ref 8.4–10.5)
Chloride: 99 mEq/L (ref 96–112)
Creatinine, Ser: 2.04 mg/dL — ABNORMAL HIGH (ref 0.40–1.50)
GFR: 31.86 mL/min — ABNORMAL LOW (ref >60.00)
Glucose, Bld: 87 mg/dL (ref 70–99)
Potassium: 4.5 mEq/L (ref 3.5–5.1)
Sodium: 137 mEq/L (ref 135–145)
Total Bilirubin: 0.6 mg/dL (ref 0.2–1.2)
Total Protein: 6.5 g/dL (ref 6.0–8.3)
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
## 2018-11-26 ENCOUNTER — Telehealth: Payer: Self-pay

## 2018-11-26 NOTE — Telephone Encounter (Signed)

## 2018-12-03 ENCOUNTER — Ambulatory Visit (INDEPENDENT_AMBULATORY_CARE_PROVIDER_SITE_OTHER): Payer: Medicare Other | Admitting: Pharmacist

## 2018-12-03 ENCOUNTER — Other Ambulatory Visit: Payer: Self-pay

## 2018-12-03 DIAGNOSIS — I4821 Permanent atrial fibrillation: Secondary | ICD-10-CM

## 2018-12-03 DIAGNOSIS — Z7901 Long term (current) use of anticoagulants: Secondary | ICD-10-CM

## 2018-12-03 LAB — POCT INR: INR: 2.3 (ref 2.0–3.0)

## 2018-12-13 ENCOUNTER — Other Ambulatory Visit: Payer: Self-pay | Admitting: Cardiovascular Disease

## 2018-12-13 ENCOUNTER — Other Ambulatory Visit: Payer: Self-pay | Admitting: Family Medicine

## 2018-12-24 ENCOUNTER — Other Ambulatory Visit: Payer: Self-pay

## 2018-12-24 ENCOUNTER — Ambulatory Visit (INDEPENDENT_AMBULATORY_CARE_PROVIDER_SITE_OTHER): Payer: Medicare Other | Admitting: Pharmacist Clinician (PhC)/ Clinical Pharmacy Specialist

## 2018-12-24 DIAGNOSIS — I4821 Permanent atrial fibrillation: Secondary | ICD-10-CM | POA: Diagnosis not present

## 2018-12-24 DIAGNOSIS — Z7901 Long term (current) use of anticoagulants: Secondary | ICD-10-CM

## 2018-12-24 LAB — POCT INR: INR: 1.8 — AB (ref 2.0–3.0)

## 2018-12-24 NOTE — Patient Instructions (Signed)
Continue taking 1 tablet each Monday and Thursday, 1/2 tablet all other days. Repeat INR in 4 weeks

## 2019-01-21 ENCOUNTER — Other Ambulatory Visit: Payer: Self-pay

## 2019-01-21 ENCOUNTER — Ambulatory Visit (INDEPENDENT_AMBULATORY_CARE_PROVIDER_SITE_OTHER): Payer: Medicare Other | Admitting: Pharmacist Clinician (PhC)/ Clinical Pharmacy Specialist

## 2019-01-21 DIAGNOSIS — I4821 Permanent atrial fibrillation: Secondary | ICD-10-CM | POA: Diagnosis not present

## 2019-01-21 DIAGNOSIS — Z7901 Long term (current) use of anticoagulants: Secondary | ICD-10-CM

## 2019-01-21 LAB — POCT INR: INR: 2.3 (ref 2.0–3.0)

## 2019-01-27 ENCOUNTER — Ambulatory Visit (INDEPENDENT_AMBULATORY_CARE_PROVIDER_SITE_OTHER): Payer: Medicare Other | Admitting: *Deleted

## 2019-01-27 DIAGNOSIS — I469 Cardiac arrest, cause unspecified: Secondary | ICD-10-CM

## 2019-01-28 LAB — CUP PACEART REMOTE DEVICE CHECK
Battery Remaining Longevity: 144 mo
Battery Remaining Percentage: 100 %
Brady Statistic RV Percent Paced: 99 %
Date Time Interrogation Session: 20200908085000
HighPow Impedance: 43 Ohm
Implantable Lead Implant Date: 20000223
Implantable Lead Location: 753860
Implantable Lead Model: 144
Implantable Lead Serial Number: 331438
Implantable Pulse Generator Implant Date: 20180115
Lead Channel Impedance Value: 1015 Ohm
Lead Channel Pacing Threshold Amplitude: 0.5 V
Lead Channel Pacing Threshold Pulse Width: 0.4 ms
Lead Channel Setting Pacing Amplitude: 2.5 V
Lead Channel Setting Pacing Pulse Width: 0.4 ms
Lead Channel Setting Sensing Sensitivity: 0.5 mV
Pulse Gen Serial Number: 195683

## 2019-02-11 ENCOUNTER — Encounter: Payer: Self-pay | Admitting: Cardiology

## 2019-02-11 ENCOUNTER — Encounter: Payer: Self-pay | Admitting: Internal Medicine

## 2019-02-11 ENCOUNTER — Other Ambulatory Visit: Payer: Self-pay

## 2019-02-11 ENCOUNTER — Ambulatory Visit (INDEPENDENT_AMBULATORY_CARE_PROVIDER_SITE_OTHER): Payer: Medicare Other | Admitting: Internal Medicine

## 2019-02-11 VITALS — BP 106/62 | HR 72 | Ht 62.0 in | Wt 202.0 lb

## 2019-02-11 DIAGNOSIS — R600 Localized edema: Secondary | ICD-10-CM

## 2019-02-11 DIAGNOSIS — Z95810 Presence of automatic (implantable) cardiac defibrillator: Secondary | ICD-10-CM

## 2019-02-11 DIAGNOSIS — N183 Chronic kidney disease, stage 3 unspecified: Secondary | ICD-10-CM

## 2019-02-11 DIAGNOSIS — I4821 Permanent atrial fibrillation: Secondary | ICD-10-CM

## 2019-02-11 DIAGNOSIS — I469 Cardiac arrest, cause unspecified: Secondary | ICD-10-CM | POA: Diagnosis not present

## 2019-02-11 LAB — CUP PACEART INCLINIC DEVICE CHECK
Brady Statistic RV Percent Paced: 99 %
Date Time Interrogation Session: 20200923040000
HighPow Impedance: 49 Ohm
Implantable Lead Implant Date: 20000223
Implantable Lead Location: 753860
Implantable Lead Model: 144
Implantable Lead Serial Number: 331438
Implantable Pulse Generator Implant Date: 20180115
Lead Channel Impedance Value: 1083 Ohm
Lead Channel Pacing Threshold Amplitude: 0.4 V
Lead Channel Pacing Threshold Pulse Width: 0.4 ms
Lead Channel Setting Pacing Amplitude: 2.5 V
Lead Channel Setting Pacing Pulse Width: 0.4 ms
Lead Channel Setting Sensing Sensitivity: 0.5 mV
Pulse Gen Serial Number: 195683
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
## 2019-02-11 NOTE — Patient Instructions (Addendum)
Medication Instructions:  Your physician recommends that you continue on your current medications as directed. Please refer to the Current Medication list given to you today.  *If you need a refill on your cardiac medications before your next appointment, please call your pharmacy*  Labwork: None ordered  Testing/Procedures: None ordered  Follow-Up: Remote monitoring is used to monitor your Pacemaker or ICD from home. This monitoring reduces the number of office visits required to check your device to one time per year. It allows [place] to keep an eye on the functioning of your device to ensure it is working properly. You are scheduled for a device check from home on 04/28/19. You may send your transmission at any time that day. If you have a wireless device, the transmission will be sent automatically. After your physician reviews your transmission, you will receive a postcard with your next transmission date.  Your physician wants you to follow-up in: 1 year with Dr. Caryl Comes.  You will receive a reminder letter in the mail two months in advance. If you don't receive a letter, please call our office to schedule the follow-up appointment.   Thank you for choosing [facility]!!

## 2019-02-11 NOTE — Progress Notes (Signed)
Remote ICD transmission.   

## 2019-02-20 ENCOUNTER — Other Ambulatory Visit: Payer: Self-pay | Admitting: Cardiovascular Disease

## 2019-02-20 NOTE — Telephone Encounter (Signed)
New Message   *STAT* If patient is at the pharmacy, call can be transferred to refill team.   1. Which medications need to be refilled? (please list name of each medication and dose if known) candesartan (ATACAND) 16 MG tablet  2. Which pharmacy/location (including street and city if local pharmacy) is medication to be sent to? [place], [place] AT [place]  3. Do they need a 30 day or 90 day supply? 90 day

## 2019-02-23 MED ORDER — CANDESARTAN CILEXETIL 16 MG PO TABS: 16.0000 mg | ORAL_TABLET | Freq: Every day | ORAL | 0 refills | Status: DC

## 2019-02-23 NOTE — Telephone Encounter (Signed)
Refill sent as requested. 

## 2019-03-05 ENCOUNTER — Other Ambulatory Visit: Payer: Self-pay | Admitting: Cardiovascular Disease

## 2019-03-06 ENCOUNTER — Encounter: Payer: Self-pay | Admitting: Cardiovascular Disease

## 2019-03-06 ENCOUNTER — Other Ambulatory Visit: Payer: Self-pay

## 2019-03-06 ENCOUNTER — Ambulatory Visit (INDEPENDENT_AMBULATORY_CARE_PROVIDER_SITE_OTHER): Payer: Medicare Other | Admitting: Pharmacist

## 2019-03-06 ENCOUNTER — Ambulatory Visit (INDEPENDENT_AMBULATORY_CARE_PROVIDER_SITE_OTHER): Payer: Medicare Other | Admitting: Cardiovascular Disease

## 2019-03-06 VITALS — BP 125/62 | HR 72 | Temp 96.8°F | Ht 62.0 in | Wt 202.6 lb

## 2019-03-06 DIAGNOSIS — E7800 Pure hypercholesterolemia, unspecified: Secondary | ICD-10-CM

## 2019-03-06 DIAGNOSIS — I4821 Permanent atrial fibrillation: Secondary | ICD-10-CM | POA: Diagnosis not present

## 2019-03-06 DIAGNOSIS — Z7901 Long term (current) use of anticoagulants: Secondary | ICD-10-CM

## 2019-03-06 DIAGNOSIS — I5032 Chronic diastolic (congestive) heart failure: Secondary | ICD-10-CM

## 2019-03-06 DIAGNOSIS — G4733 Obstructive sleep apnea (adult) (pediatric): Secondary | ICD-10-CM | POA: Diagnosis not present

## 2019-03-06 LAB — POCT INR: INR: 2.1 (ref 2.0–3.0)
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
## 2019-03-06 NOTE — Patient Instructions (Addendum)
Medication Instructions:  Your physician recommends that you continue on your current medications as directed. Please refer to the Current Medication list given to you today.  *If you need a refill on your cardiac medications before your next appointment, please call your pharmacy*  Lab Work: [place] PCP SEND TO DR [place]    If you have labs (blood work) drawn today and your tests are completely normal, you will receive your results only by: [name] MyChart Message (if you have MyChart) OR . A paper copy in the mail If you have any lab test that is abnormal or we need to change your treatment, we will call you to review the results.  Testing/Procedures: NONE  Follow-Up: At [facility], you and your health needs are our priority.  As part of our continuing mission to provide you with exceptional heart care, we have created designated Provider Care Teams.  These Care Teams include your primary Cardiologist (physician) and Advanced Practice Providers (APPs -  Physician Assistants and Nurse Practitioners) who all work together to provide you with the care you need, when you need it.  Your next appointment:   6 months You will receive a reminder letter in the mail two months in advance. If you don't receive a letter, please call our office to schedule the follow-up appointment.   The format for your next appointment:   Either In Person or Virtual  Provider:   You may see DR [facility] or one of the following Advanced Practice Providers on your designated Care Team:    Kerin Ransom, PA-C  [place], [place]  Coletta Memos, [place]

## 2019-03-09 ENCOUNTER — Other Ambulatory Visit: Payer: Self-pay

## 2019-03-09 ENCOUNTER — Other Ambulatory Visit: Payer: Self-pay | Admitting: *Deleted

## 2019-03-09 DIAGNOSIS — E7800 Pure hypercholesterolemia, unspecified: Secondary | ICD-10-CM

## 2019-03-09 DIAGNOSIS — I4821 Permanent atrial fibrillation: Secondary | ICD-10-CM

## 2019-03-09 DIAGNOSIS — I5032 Chronic diastolic (congestive) heart failure: Secondary | ICD-10-CM

## 2019-03-09 DIAGNOSIS — Z7901 Long term (current) use of anticoagulants: Secondary | ICD-10-CM

## 2019-03-10 LAB — COMPREHENSIVE METABOLIC PANEL
ALT: 14 IU/L (ref 0–44)
AST: 17 IU/L (ref 0–40)
Albumin/Globulin Ratio: 2.3 — ABNORMAL HIGH (ref 1.2–2.2)
Albumin: 4.6 g/dL (ref 3.7–4.7)
Alkaline Phosphatase: 75 IU/L (ref 39–117)
BUN/Creatinine Ratio: 22 (ref 10–24)
BUN: 39 mg/dL — ABNORMAL HIGH (ref 8–27)
Bilirubin Total: 0.6 mg/dL (ref 0.0–1.2)
CO2: 27 mmol/L (ref 20–29)
Calcium: 9.8 mg/dL (ref 8.6–10.2)
Chloride: 99 mmol/L (ref 96–106)
Creatinine, Ser: 1.81 mg/dL — ABNORMAL HIGH (ref 0.76–1.27)
GFR calc Af Amer: 41 mL/min/{1.73_m2} — ABNORMAL LOW (ref >59)
GFR calc non Af Amer: 36 mL/min/{1.73_m2} — ABNORMAL LOW (ref >59)
Globulin, Total: 2 g/dL (ref 1.5–4.5)
Glucose: 82 mg/dL (ref 65–99)
Potassium: 4.7 mmol/L (ref 3.5–5.2)
Sodium: 139 mmol/L (ref 134–144)
Total Protein: 6.6 g/dL (ref 6.0–8.5)

## 2019-03-10 LAB — LIPID PANEL
Chol/HDL Ratio: 3.2 ratio (ref 0.0–5.0)
Cholesterol, Total: 123 mg/dL (ref 100–199)
HDL: 39 mg/dL — ABNORMAL LOW (ref >39)
LDL Chol Calc (NIH): 59 mg/dL (ref 0–99)
Triglycerides: 141 mg/dL (ref 0–149)
VLDL Cholesterol Cal: 25 mg/dL (ref 5–40)

## 2019-03-15 ENCOUNTER — Encounter: Payer: Self-pay | Admitting: Family Medicine

## 2019-03-19 ENCOUNTER — Other Ambulatory Visit: Payer: Self-pay | Admitting: Cardiovascular Disease

## 2019-03-23 ENCOUNTER — Encounter: Payer: Self-pay | Admitting: Family Medicine

## 2019-03-23 ENCOUNTER — Telehealth: Payer: Self-pay | Admitting: Family Medicine

## 2019-03-23 ENCOUNTER — Ambulatory Visit (INDEPENDENT_AMBULATORY_CARE_PROVIDER_SITE_OTHER): Payer: Medicare Other | Admitting: Family Medicine

## 2019-03-23 ENCOUNTER — Other Ambulatory Visit: Payer: Self-pay

## 2019-03-23 VITALS — BP 116/74 | HR 71 | Temp 97.9°F | Resp 16 | Ht 62.0 in | Wt 201.0 lb

## 2019-03-23 DIAGNOSIS — N183 Chronic kidney disease, stage 3 unspecified: Secondary | ICD-10-CM

## 2019-03-23 DIAGNOSIS — R600 Localized edema: Secondary | ICD-10-CM | POA: Diagnosis not present

## 2019-03-23 DIAGNOSIS — I5032 Chronic diastolic (congestive) heart failure: Secondary | ICD-10-CM

## 2019-03-23 DIAGNOSIS — I10 Essential (primary) hypertension: Secondary | ICD-10-CM

## 2019-03-23 DIAGNOSIS — Z7901 Long term (current) use of anticoagulants: Secondary | ICD-10-CM

## 2019-03-23 DIAGNOSIS — I4821 Permanent atrial fibrillation: Secondary | ICD-10-CM

## 2019-03-23 NOTE — Telephone Encounter (Signed)
Lab called and stated that there was not enough blood to complete CBC.  Please advise.
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
## 2019-03-23 NOTE — Telephone Encounter (Signed)
OKAY. Pls call patient and just inform him that the lab could not be done. It is ok for him to just wait and we'll get this lab at next follow up visit in 4 months.-thx

## 2019-03-24 NOTE — Telephone Encounter (Signed)
Detailed message left for patient, okay per DPR.

## 2019-04-15 ENCOUNTER — Ambulatory Visit: Payer: Medicare Other | Admitting: Pharmacist

## 2019-04-15 ENCOUNTER — Other Ambulatory Visit: Payer: Self-pay

## 2019-04-15 DIAGNOSIS — I4821 Permanent atrial fibrillation: Secondary | ICD-10-CM

## 2019-04-15 DIAGNOSIS — Z7901 Long term (current) use of anticoagulants: Secondary | ICD-10-CM

## 2019-04-15 LAB — POCT INR: INR: 2.3 (ref 2.0–3.0)

## 2019-04-15 MED ORDER — CARVEDILOL 3.125 MG PO TABS: 3.1250 mg | ORAL_TABLET | Freq: Two times a day (BID) | ORAL | 3 refills | Status: DC

## 2019-04-28 ENCOUNTER — Ambulatory Visit (INDEPENDENT_AMBULATORY_CARE_PROVIDER_SITE_OTHER): Payer: Medicare Other | Admitting: *Deleted

## 2019-04-28 DIAGNOSIS — I469 Cardiac arrest, cause unspecified: Secondary | ICD-10-CM

## 2019-04-28 LAB — CUP PACEART REMOTE DEVICE CHECK
Battery Remaining Longevity: 144 mo
Battery Remaining Percentage: 100 %
Brady Statistic RV Percent Paced: 100 %
Date Time Interrogation Session: 20201208045100
HighPow Impedance: 43 Ohm
Implantable Lead Implant Date: 20000223
Implantable Lead Location: 753860
Implantable Lead Model: 144
Implantable Lead Serial Number: 331438
Implantable Pulse Generator Implant Date: 20180115
Lead Channel Impedance Value: 1082 Ohm
Lead Channel Pacing Threshold Amplitude: 0.4 V
Lead Channel Pacing Threshold Pulse Width: 0.4 ms
Lead Channel Setting Pacing Amplitude: 2.5 V
Lead Channel Setting Pacing Pulse Width: 0.4 ms
Lead Channel Setting Sensing Sensitivity: 0.5 mV
Pulse Gen Serial Number: 195683

## 2019-05-01 DIAGNOSIS — M25552 Pain in left hip: Secondary | ICD-10-CM | POA: Insufficient documentation

## 2019-05-06 DIAGNOSIS — M25561 Pain in right knee: Secondary | ICD-10-CM | POA: Insufficient documentation

## 2019-05-06 DIAGNOSIS — M25522 Pain in left elbow: Secondary | ICD-10-CM | POA: Insufficient documentation

## 2019-05-21 ENCOUNTER — Encounter

## 2019-05-27 ENCOUNTER — Other Ambulatory Visit: Payer: Self-pay

## 2019-05-27 ENCOUNTER — Ambulatory Visit (INDEPENDENT_AMBULATORY_CARE_PROVIDER_SITE_OTHER): Payer: Medicare Other | Admitting: Pharmacist

## 2019-05-27 DIAGNOSIS — Z7901 Long term (current) use of anticoagulants: Secondary | ICD-10-CM | POA: Diagnosis not present

## 2019-05-27 DIAGNOSIS — I4821 Permanent atrial fibrillation: Secondary | ICD-10-CM | POA: Diagnosis not present

## 2019-05-27 LAB — POCT INR: INR: 1.9 — AB (ref 2.0–3.0)

## 2019-05-29 NOTE — Progress Notes (Signed)
ICD remote 

## 2019-06-04 ENCOUNTER — Other Ambulatory Visit: Payer: Self-pay | Admitting: Cardiovascular Disease

## 2019-06-15 ENCOUNTER — Other Ambulatory Visit: Payer: Self-pay

## 2019-06-15 ENCOUNTER — Encounter: Payer: Self-pay | Admitting: Family Medicine

## 2019-06-15 ENCOUNTER — Ambulatory Visit (INDEPENDENT_AMBULATORY_CARE_PROVIDER_SITE_OTHER): Payer: Medicare Other | Admitting: Family Medicine

## 2019-06-15 VITALS — BP 133/82 | HR 61 | Temp 98.1°F | Resp 16 | Ht 62.0 in | Wt 189.4 lb

## 2019-06-15 DIAGNOSIS — B029 Zoster without complications: Secondary | ICD-10-CM

## 2019-06-15 MED ORDER — VALACYCLOVIR HCL 1 G PO TABS: 1000.0000 mg | ORAL_TABLET | Freq: Two times a day (BID) | ORAL | 0 refills | Status: DC
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
## 2019-06-22 ENCOUNTER — Ambulatory Visit (INDEPENDENT_AMBULATORY_CARE_PROVIDER_SITE_OTHER): Payer: Medicare Other | Admitting: Family Medicine

## 2019-06-22 ENCOUNTER — Encounter: Payer: Self-pay | Admitting: Family Medicine

## 2019-06-22 ENCOUNTER — Other Ambulatory Visit: Payer: Self-pay

## 2019-06-22 VITALS — BP 127/71 | HR 71 | Temp 97.6°F | Resp 16 | Ht 62.0 in | Wt 189.4 lb

## 2019-06-22 DIAGNOSIS — M170 Bilateral primary osteoarthritis of knee: Secondary | ICD-10-CM | POA: Diagnosis not present

## 2019-06-22 DIAGNOSIS — B029 Zoster without complications: Secondary | ICD-10-CM | POA: Diagnosis not present
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
## 2019-06-23 ENCOUNTER — Other Ambulatory Visit: Payer: Self-pay | Admitting: Cardiovascular Disease

## 2019-07-03 ENCOUNTER — Telehealth: Payer: Self-pay | Admitting: Cardiovascular Disease

## 2019-07-03 NOTE — Telephone Encounter (Signed)
New message    *STAT* If patient is at the pharmacy, call can be transferred to refill team.   1. Which medications need to be refilled? (please list name of each medication and dose if known) carvedilol (COREG) 3.125 MG tablet  2. Which pharmacy/location (including street and city if local pharmacy) is medication to be sent to?[id number] - [place], [place] - [address]  3. Do they need a 30 day or 90 day supply90

## 2019-07-08 ENCOUNTER — Ambulatory Visit (INDEPENDENT_AMBULATORY_CARE_PROVIDER_SITE_OTHER): Payer: Medicare Other | Admitting: Pharmacist

## 2019-07-08 ENCOUNTER — Other Ambulatory Visit: Payer: Self-pay

## 2019-07-08 DIAGNOSIS — Z7901 Long term (current) use of anticoagulants: Secondary | ICD-10-CM

## 2019-07-08 DIAGNOSIS — I4821 Permanent atrial fibrillation: Secondary | ICD-10-CM

## 2019-07-08 LAB — POCT INR: INR: 2.9 (ref 2.0–3.0)

## 2019-07-08 MED ORDER — CARVEDILOL 3.125 MG PO TABS: 3.1250 mg | ORAL_TABLET | Freq: Two times a day (BID) | ORAL | 3 refills | Status: DC

## 2019-07-21 ENCOUNTER — Encounter: Payer: Self-pay | Admitting: Cardiovascular Disease

## 2019-07-21 ENCOUNTER — Other Ambulatory Visit: Payer: Self-pay

## 2019-07-21 ENCOUNTER — Ambulatory Visit (INDEPENDENT_AMBULATORY_CARE_PROVIDER_SITE_OTHER): Payer: Medicare Other | Admitting: Cardiovascular Disease

## 2019-07-21 VITALS — BP 122/64 | HR 70 | Ht 62.0 in | Wt 186.6 lb

## 2019-07-21 DIAGNOSIS — G4733 Obstructive sleep apnea (adult) (pediatric): Secondary | ICD-10-CM | POA: Diagnosis not present

## 2019-07-21 DIAGNOSIS — N1831 Chronic kidney disease, stage 3a: Secondary | ICD-10-CM | POA: Diagnosis not present

## 2019-07-21 DIAGNOSIS — I4821 Permanent atrial fibrillation: Secondary | ICD-10-CM
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
## 2019-07-21 NOTE — Patient Instructions (Signed)
Medication Instructions:  Your physician recommends that you continue on your current medications as directed. Please refer to the Current Medication list given to you today.  *If you need a refill on your cardiac medications before your next appointment, please call your pharmacy*  Lab Work: NONE  Testing/Procedures: NONE    Follow-Up: At [facility], you and your health needs are our priority.  As part of our continuing mission to provide you with exceptional heart care, we have created designated Provider Care Teams.  These Care Teams include your primary Cardiologist (physician) and Advanced Practice Providers (APPs -  Physician Assistants and Nurse Practitioners) who all work together to provide you with the care you need, when you need it.  We recommend signing up for the patient portal called "MyChart".  Sign up information is provided on this After Visit Summary.  MyChart is used to connect with patients for Virtual Visits (Telemedicine).  Patients are able to view lab/test results, encounter notes, upcoming appointments, etc.  Non-urgent messages can be sent to your provider as well.   To learn more about what you can do with MyChart, go to [email or web address].    Your next appointment:   6 month(s)You will receive a reminder letter in the mail two months in advance. If you don't receive a letter, please call our office to schedule the follow-up appointment.   The format for your next appointment:   Either In Person or Virtual  Provider:   You may see DR Shoals or one of the following Advanced Practice Providers on your designated Care Team:    Luke Kilroy, PA-C  Callie Goodrich, PA-C  Jesse Cleaver, FNP     

## 2019-07-22 ENCOUNTER — Encounter: Payer: Self-pay | Admitting: Family Medicine

## 2019-07-22 ENCOUNTER — Ambulatory Visit (INDEPENDENT_AMBULATORY_CARE_PROVIDER_SITE_OTHER): Payer: Medicare Other | Admitting: Family Medicine

## 2019-07-22 VITALS — BP 127/77 | HR 71 | Temp 98.1°F | Resp 16 | Ht 62.0 in | Wt 187.2 lb

## 2019-07-22 DIAGNOSIS — N183 Chronic kidney disease, stage 3 unspecified: Secondary | ICD-10-CM | POA: Diagnosis not present

## 2019-07-22 DIAGNOSIS — I5032 Chronic diastolic (congestive) heart failure: Secondary | ICD-10-CM

## 2019-07-22 DIAGNOSIS — N401 Enlarged prostate with lower urinary tract symptoms: Secondary | ICD-10-CM | POA: Diagnosis not present

## 2019-07-22 DIAGNOSIS — I10 Essential (primary) hypertension: Secondary | ICD-10-CM

## 2019-07-22 DIAGNOSIS — Z8619 Personal history of other infectious and parasitic diseases: Secondary | ICD-10-CM

## 2019-07-22 DIAGNOSIS — I4891 Unspecified atrial fibrillation: Secondary | ICD-10-CM | POA: Diagnosis not present

## 2019-07-22 DIAGNOSIS — Z7901 Long term (current) use of anticoagulants: Secondary | ICD-10-CM | POA: Diagnosis not present

## 2019-07-22 DIAGNOSIS — I872 Venous insufficiency (chronic) (peripheral): Secondary | ICD-10-CM

## 2019-07-22 DIAGNOSIS — N138 Other obstructive and reflux uropathy: Secondary | ICD-10-CM | POA: Diagnosis not present

## 2019-07-22 LAB — CBC
HCT: 32.5 % — ABNORMAL LOW (ref 39.0–52.0)
Hemoglobin: 11 g/dL — ABNORMAL LOW (ref 13.0–17.0)
MCHC: 33.8 g/dL (ref 30.0–36.0)
MCV: 87.8 fl (ref 78.0–100.0)
Platelets: 229 10*3/uL (ref 150.0–400.0)
RBC: 3.7 Mil/uL — ABNORMAL LOW (ref 4.22–5.81)
RDW: 16.6 % — ABNORMAL HIGH (ref 11.5–15.5)
WBC: 6.5 10*3/uL (ref 4.0–10.5)

## 2019-07-22 LAB — BASIC METABOLIC PANEL
BUN: 37 mg/dL — ABNORMAL HIGH (ref 6–23)
CO2: 29 mEq/L (ref 19–32)
Calcium: 10.2 mg/dL (ref 8.4–10.5)
Chloride: 97 mEq/L (ref 96–112)
Creatinine, Ser: 1.85 mg/dL — ABNORMAL HIGH (ref 0.40–1.50)
GFR: 35.6 mL/min — ABNORMAL LOW (ref >60.00)
Glucose, Bld: 78 mg/dL (ref 70–99)
Potassium: 5.1 mEq/L (ref 3.5–5.1)
Sodium: 134 mEq/L — ABNORMAL LOW (ref 135–145)

## 2019-07-22 MED ORDER — TAMSULOSIN HCL 0.4 MG PO CAPS: 0.4000 mg | ORAL_CAPSULE | Freq: Every day | ORAL | 3 refills | Status: DC
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
## 2019-07-26 ENCOUNTER — Other Ambulatory Visit: Payer: Self-pay | Admitting: Cardiovascular Disease

## 2019-07-28 ENCOUNTER — Ambulatory Visit (INDEPENDENT_AMBULATORY_CARE_PROVIDER_SITE_OTHER): Payer: Medicare Other | Admitting: *Deleted

## 2019-07-28 DIAGNOSIS — I469 Cardiac arrest, cause unspecified: Secondary | ICD-10-CM

## 2019-07-28 LAB — CUP PACEART REMOTE DEVICE CHECK
Battery Remaining Longevity: 144 mo
Battery Remaining Percentage: 100 %
Brady Statistic RV Percent Paced: 100 %
Date Time Interrogation Session: 20210309045100
HighPow Impedance: 38 Ohm
Implantable Lead Implant Date: 20000223
Implantable Lead Location: 753860
Implantable Lead Model: 144
Implantable Lead Serial Number: 331438
Implantable Pulse Generator Implant Date: 20180115
Lead Channel Impedance Value: 935 Ohm
Lead Channel Pacing Threshold Amplitude: 0.4 V
Lead Channel Pacing Threshold Pulse Width: 0.4 ms
Lead Channel Setting Pacing Amplitude: 2.5 V
Lead Channel Setting Pacing Pulse Width: 0.4 ms
Lead Channel Setting Sensing Sensitivity: 0.5 mV
Pulse Gen Serial Number: 195683

## 2019-07-29 NOTE — Progress Notes (Signed)
ICD Remote  

## 2019-08-19 ENCOUNTER — Other Ambulatory Visit: Payer: Self-pay

## 2019-08-19 ENCOUNTER — Ambulatory Visit (INDEPENDENT_AMBULATORY_CARE_PROVIDER_SITE_OTHER): Payer: Medicare Other | Admitting: Pharmacist

## 2019-08-19 DIAGNOSIS — I4821 Permanent atrial fibrillation: Secondary | ICD-10-CM

## 2019-08-19 DIAGNOSIS — Z7901 Long term (current) use of anticoagulants: Secondary | ICD-10-CM | POA: Diagnosis not present

## 2019-08-19 LAB — POCT INR: INR: 2.1 (ref 2.0–3.0)

## 2019-09-02 ENCOUNTER — Other Ambulatory Visit: Payer: Self-pay | Admitting: Cardiovascular Disease

## 2019-09-30 ENCOUNTER — Ambulatory Visit (INDEPENDENT_AMBULATORY_CARE_PROVIDER_SITE_OTHER): Payer: Medicare Other | Admitting: Pharmacist

## 2019-09-30 ENCOUNTER — Other Ambulatory Visit: Payer: Self-pay

## 2019-09-30 DIAGNOSIS — Z7901 Long term (current) use of anticoagulants: Secondary | ICD-10-CM | POA: Diagnosis not present

## 2019-09-30 DIAGNOSIS — I4821 Permanent atrial fibrillation: Secondary | ICD-10-CM | POA: Diagnosis not present

## 2019-09-30 LAB — POCT INR: INR: 1.6 — AB (ref 2.0–3.0)

## 2019-10-21 ENCOUNTER — Other Ambulatory Visit: Payer: Self-pay

## 2019-10-21 ENCOUNTER — Ambulatory Visit (INDEPENDENT_AMBULATORY_CARE_PROVIDER_SITE_OTHER): Payer: Medicare Other | Admitting: Pharmacist Clinician (PhC)/ Clinical Pharmacy Specialist

## 2019-10-21 DIAGNOSIS — I4821 Permanent atrial fibrillation: Secondary | ICD-10-CM

## 2019-10-21 DIAGNOSIS — Z7901 Long term (current) use of anticoagulants: Secondary | ICD-10-CM

## 2019-10-21 LAB — POCT INR: INR: 2.1 (ref 2.0–3.0)

## 2019-10-27 ENCOUNTER — Ambulatory Visit (INDEPENDENT_AMBULATORY_CARE_PROVIDER_SITE_OTHER): Payer: Medicare Other | Admitting: *Deleted

## 2019-10-27 DIAGNOSIS — Z95810 Presence of automatic (implantable) cardiac defibrillator: Secondary | ICD-10-CM | POA: Diagnosis not present

## 2019-10-27 LAB — CUP PACEART REMOTE DEVICE CHECK
Battery Remaining Longevity: 144 mo
Battery Remaining Percentage: 100 %
Brady Statistic RV Percent Paced: 100 %
Date Time Interrogation Session: 20210608045000
HighPow Impedance: 45 Ohm
Implantable Lead Implant Date: 20000223
Implantable Lead Location: 753860
Implantable Lead Model: 144
Implantable Lead Serial Number: 331438
Implantable Pulse Generator Implant Date: 20180115
Lead Channel Impedance Value: 1028 Ohm
Lead Channel Pacing Threshold Amplitude: 0.4 V
Lead Channel Pacing Threshold Pulse Width: 0.4 ms
Lead Channel Setting Pacing Amplitude: 2.5 V
Lead Channel Setting Pacing Pulse Width: 0.4 ms
Lead Channel Setting Sensing Sensitivity: 0.5 mV
Pulse Gen Serial Number: 195683

## 2019-10-28 NOTE — Progress Notes (Signed)
Remote ICD transmission.   

## 2019-11-16 ENCOUNTER — Ambulatory Visit (INDEPENDENT_AMBULATORY_CARE_PROVIDER_SITE_OTHER): Payer: Medicare Other | Admitting: Pharmacist

## 2019-11-16 ENCOUNTER — Other Ambulatory Visit: Payer: Self-pay | Admitting: Cardiovascular Disease

## 2019-11-16 ENCOUNTER — Other Ambulatory Visit: Payer: Self-pay

## 2019-11-16 DIAGNOSIS — Z7901 Long term (current) use of anticoagulants: Secondary | ICD-10-CM

## 2019-11-16 DIAGNOSIS — Z5181 Encounter for therapeutic drug level monitoring: Secondary | ICD-10-CM | POA: Diagnosis not present

## 2019-11-16 DIAGNOSIS — I4821 Permanent atrial fibrillation: Secondary | ICD-10-CM | POA: Diagnosis not present

## 2019-11-16 LAB — POCT INR: INR: 1.7 — AB (ref 2.0–3.0)

## 2019-11-16 NOTE — Patient Instructions (Signed)
Take 1.5 tablets today, then continue taking 1 tablet each Monday and Thursday, 1/2 tablet all other days. Repeat INR in 3 weeks

## 2019-11-17 ENCOUNTER — Other Ambulatory Visit: Payer: Self-pay

## 2019-11-17 MED ORDER — TAMSULOSIN HCL 0.4 MG PO CAPS: 0.4000 mg | ORAL_CAPSULE | Freq: Every day | ORAL | 0 refills | Status: DC

## 2019-11-24 ENCOUNTER — Other Ambulatory Visit: Payer: Self-pay

## 2019-11-24 ENCOUNTER — Ambulatory Visit (INDEPENDENT_AMBULATORY_CARE_PROVIDER_SITE_OTHER): Payer: Medicare Other | Admitting: Family Medicine

## 2019-11-24 ENCOUNTER — Encounter: Payer: Self-pay | Admitting: Family Medicine

## 2019-11-24 VITALS — BP 119/76 | HR 70 | Temp 97.8°F | Resp 16 | Ht 62.0 in | Wt 184.4 lb

## 2019-11-24 DIAGNOSIS — H6122 Impacted cerumen, left ear: Secondary | ICD-10-CM

## 2019-11-24 DIAGNOSIS — N183 Chronic kidney disease, stage 3 unspecified: Secondary | ICD-10-CM | POA: Diagnosis not present

## 2019-11-24 DIAGNOSIS — I130 Hypertensive heart and chronic kidney disease with heart failure and stage 1 through stage 4 chronic kidney disease, or unspecified chronic kidney disease: Secondary | ICD-10-CM

## 2019-11-24 DIAGNOSIS — D649 Anemia, unspecified: Secondary | ICD-10-CM | POA: Diagnosis not present

## 2019-11-24 DIAGNOSIS — I5032 Chronic diastolic (congestive) heart failure: Secondary | ICD-10-CM | POA: Diagnosis not present

## 2019-11-24 DIAGNOSIS — E7800 Pure hypercholesterolemia, unspecified: Secondary | ICD-10-CM | POA: Diagnosis not present

## 2019-11-24 DIAGNOSIS — I10 Essential (primary) hypertension: Secondary | ICD-10-CM

## 2019-11-24 DIAGNOSIS — Z7901 Long term (current) use of anticoagulants: Secondary | ICD-10-CM

## 2019-11-24 LAB — CBC WITH DIFFERENTIAL/PLATELET
Basophils Absolute: 0.1 10*3/uL (ref 0.0–0.1)
Basophils Relative: 1.2 % (ref 0.0–3.0)
Eosinophils Absolute: 0.2 10*3/uL (ref 0.0–0.7)
Eosinophils Relative: 2.6 % (ref 0.0–5.0)
HCT: 36.7 % — ABNORMAL LOW (ref 39.0–52.0)
Hemoglobin: 12 g/dL — ABNORMAL LOW (ref 13.0–17.0)
Lymphocytes Relative: 16.3 % (ref 12.0–46.0)
Lymphs Abs: 1.3 10*3/uL (ref 0.7–4.0)
MCHC: 32.7 g/dL (ref 30.0–36.0)
MCV: 89.4 fl (ref 78.0–100.0)
Monocytes Absolute: 0.9 10*3/uL (ref 0.1–1.0)
Monocytes Relative: 11.1 % (ref 3.0–12.0)
Neutro Abs: 5.4 10*3/uL (ref 1.4–7.7)
Neutrophils Relative %: 68.8 % (ref 43.0–77.0)
Platelets: 220 10*3/uL (ref 150.0–400.0)
RBC: 4.1 Mil/uL — ABNORMAL LOW (ref 4.22–5.81)
RDW: 13.6 % (ref 11.5–15.5)
WBC: 7.8 10*3/uL (ref 4.0–10.5)

## 2019-11-24 LAB — COMPREHENSIVE METABOLIC PANEL
ALT: 8 U/L (ref 0–53)
AST: 15 U/L (ref 0–37)
Albumin: 4.5 g/dL (ref 3.5–5.2)
Alkaline Phosphatase: 63 U/L (ref 39–117)
BUN: 37 mg/dL — ABNORMAL HIGH (ref 6–23)
CO2: 30 mEq/L (ref 19–32)
Calcium: 10.6 mg/dL — ABNORMAL HIGH (ref 8.4–10.5)
Chloride: 97 mEq/L (ref 96–112)
Creatinine, Ser: 1.88 mg/dL — ABNORMAL HIGH (ref 0.40–1.50)
GFR: 34.92 mL/min — ABNORMAL LOW (ref >60.00)
Glucose, Bld: 89 mg/dL (ref 70–99)
Potassium: 4.6 mEq/L (ref 3.5–5.1)
Sodium: 137 mEq/L (ref 135–145)
Total Bilirubin: 0.8 mg/dL (ref 0.2–1.2)
Total Protein: 7.1 g/dL (ref 6.0–8.3)

## 2019-11-24 LAB — LIPID PANEL
Cholesterol: 123 mg/dL (ref 0–200)
HDL: 44.1 mg/dL (ref >39.00)
LDL Cholesterol: 54 mg/dL (ref 0–99)
NonHDL: 78.59
Total CHOL/HDL Ratio: 3
Triglycerides: 124 mg/dL (ref 0.0–149.0)
VLDL: 24.8 mg/dL (ref 0.0–40.0)

## 2019-11-24 MED ORDER — TAMSULOSIN HCL 0.4 MG PO CAPS: 0.4000 mg | ORAL_CAPSULE | Freq: Every day | ORAL | 3 refills | Status: DC
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
## 2019-11-28 LAB — IRON,TIBC AND FERRITIN PANEL
%SAT: 19 % (calc) — ABNORMAL LOW (ref 20–48)
Ferritin: 159 ng/mL (ref 24–380)
Iron: 66 ug/dL (ref 50–180)
TIBC: 346 mcg/dL (calc) (ref 250–425)

## 2019-11-29 ENCOUNTER — Encounter: Payer: Self-pay | Admitting: Family Medicine

## 2019-12-10 ENCOUNTER — Other Ambulatory Visit: Payer: Self-pay

## 2019-12-10 ENCOUNTER — Ambulatory Visit (INDEPENDENT_AMBULATORY_CARE_PROVIDER_SITE_OTHER): Payer: Medicare Other | Admitting: Otolaryngology

## 2019-12-10 ENCOUNTER — Encounter (INDEPENDENT_AMBULATORY_CARE_PROVIDER_SITE_OTHER): Payer: Self-pay | Admitting: Otolaryngology

## 2019-12-10 VITALS — Temp 97.3°F

## 2019-12-10 DIAGNOSIS — H6123 Impacted cerumen, bilateral: Secondary | ICD-10-CM | POA: Diagnosis not present
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
## 2019-12-11 ENCOUNTER — Other Ambulatory Visit: Payer: Self-pay

## 2019-12-11 ENCOUNTER — Ambulatory Visit (INDEPENDENT_AMBULATORY_CARE_PROVIDER_SITE_OTHER): Payer: Medicare Other | Admitting: Pharmacist

## 2019-12-11 DIAGNOSIS — I4821 Permanent atrial fibrillation: Secondary | ICD-10-CM | POA: Diagnosis not present

## 2019-12-11 DIAGNOSIS — Z7901 Long term (current) use of anticoagulants: Secondary | ICD-10-CM

## 2019-12-11 LAB — POCT INR: INR: 1.7 — AB (ref 2.0–3.0)

## 2020-01-01 ENCOUNTER — Ambulatory Visit (INDEPENDENT_AMBULATORY_CARE_PROVIDER_SITE_OTHER): Payer: Medicare Other

## 2020-01-01 ENCOUNTER — Other Ambulatory Visit: Payer: Self-pay

## 2020-01-01 DIAGNOSIS — Z7901 Long term (current) use of anticoagulants: Secondary | ICD-10-CM

## 2020-01-01 DIAGNOSIS — I4821 Permanent atrial fibrillation: Secondary | ICD-10-CM

## 2020-01-01 LAB — POCT INR: INR: 2.1 (ref 2.0–3.0)

## 2020-01-01 NOTE — Patient Instructions (Signed)
Continue taking 1 tablet each Monday, Wednesdays and Friday, 1/2 tablet all other days. Repeat INR in 5 weeks

## 2020-01-26 ENCOUNTER — Ambulatory Visit (INDEPENDENT_AMBULATORY_CARE_PROVIDER_SITE_OTHER): Payer: Medicare Other | Admitting: *Deleted

## 2020-01-26 DIAGNOSIS — I442 Atrioventricular block, complete: Secondary | ICD-10-CM

## 2020-01-26 LAB — CUP PACEART REMOTE DEVICE CHECK
Battery Remaining Longevity: 138 mo
Battery Remaining Percentage: 100 %
Brady Statistic RV Percent Paced: 100 %
Date Time Interrogation Session: 20210907045100
HighPow Impedance: 40 Ohm
Implantable Lead Implant Date: 20000223
Implantable Lead Location: 753860
Implantable Lead Model: 144
Implantable Lead Serial Number: 331438
Implantable Pulse Generator Implant Date: 20180115
Lead Channel Impedance Value: 1059 Ohm
Lead Channel Pacing Threshold Amplitude: 0.4 V
Lead Channel Pacing Threshold Pulse Width: 0.4 ms
Lead Channel Setting Pacing Amplitude: 2.5 V
Lead Channel Setting Pacing Pulse Width: 0.4 ms
Lead Channel Setting Sensing Sensitivity: 0.5 mV
Pulse Gen Serial Number: 195683

## 2020-01-27 NOTE — Progress Notes (Signed)
Remote ICD transmission.   

## 2020-02-05 ENCOUNTER — Ambulatory Visit (INDEPENDENT_AMBULATORY_CARE_PROVIDER_SITE_OTHER): Payer: Medicare Other

## 2020-02-05 ENCOUNTER — Other Ambulatory Visit: Payer: Self-pay

## 2020-02-05 DIAGNOSIS — Z5181 Encounter for therapeutic drug level monitoring: Secondary | ICD-10-CM

## 2020-02-05 DIAGNOSIS — I4821 Permanent atrial fibrillation: Secondary | ICD-10-CM

## 2020-02-05 DIAGNOSIS — Z7901 Long term (current) use of anticoagulants: Secondary | ICD-10-CM | POA: Diagnosis not present

## 2020-02-05 LAB — POCT INR: INR: 2 (ref 2.0–3.0)

## 2020-02-05 NOTE — Patient Instructions (Signed)
Continue taking 1 tablet each Monday, Wednesdays and Friday, 1/2 tablet all other days. Repeat INR in 6 weeks

## 2020-02-13 ENCOUNTER — Other Ambulatory Visit: Payer: Self-pay | Admitting: Cardiovascular Disease

## 2020-02-15 ENCOUNTER — Other Ambulatory Visit: Payer: Self-pay

## 2020-02-15 ENCOUNTER — Ambulatory Visit (INDEPENDENT_AMBULATORY_CARE_PROVIDER_SITE_OTHER): Payer: Medicare Other | Admitting: Cardiovascular Disease

## 2020-02-15 ENCOUNTER — Encounter: Payer: Self-pay | Admitting: Cardiovascular Disease

## 2020-02-15 VITALS — BP 116/72 | HR 71 | Ht 62.0 in | Wt 188.0 lb

## 2020-02-15 DIAGNOSIS — I5032 Chronic diastolic (congestive) heart failure: Secondary | ICD-10-CM

## 2020-02-15 DIAGNOSIS — I10 Essential (primary) hypertension: Secondary | ICD-10-CM | POA: Diagnosis not present

## 2020-02-15 DIAGNOSIS — I442 Atrioventricular block, complete: Secondary | ICD-10-CM | POA: Diagnosis not present

## 2020-02-15 DIAGNOSIS — I4821 Permanent atrial fibrillation: Secondary | ICD-10-CM

## 2020-02-15 NOTE — Patient Instructions (Signed)
Medication Instructions:  Your physician recommends that you continue on your current medications as directed. Please refer to the Current Medication list given to you today.  *If you need a refill on your cardiac medications before your next appointment, please call your pharmacy*  Lab Work: NONE  Testing/Procedures: NONE  Follow-Up: At [facility], you and your health needs are our priority.  As part of our continuing mission to provide you with exceptional heart care, we have created designated Provider Care Teams.  These Care Teams include your primary Cardiologist (physician) and Advanced Practice Providers (APPs -  Physician Assistants and Nurse Practitioners) who all work together to provide you with the care you need, when you need it.  We recommend signing up for the patient portal called "MyChart".  Sign up information is provided on this After Visit Summary.  MyChart is used to connect with patients for Virtual Visits (Telemedicine).  Patients are able to view lab/test results, encounter notes, upcoming appointments, etc.  Non-urgent messages can be sent to your provider as well.   To learn more about what you can do with MyChart, go to [email or web address].    Your next appointment:   6 month(s)  The format for your next appointment:   In Person  Provider:   You may see DR [facility]  or one of the following Advanced Practice Providers on your designated Care Team:    Corine Shelter, PA-C  [place], [place]  Edd Fabian, [place]  Other Instructions  WORK ON YOUR EXERCISE
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
## 2020-02-22 ENCOUNTER — Encounter: Payer: Self-pay | Admitting: Internal Medicine

## 2020-02-22 ENCOUNTER — Ambulatory Visit (INDEPENDENT_AMBULATORY_CARE_PROVIDER_SITE_OTHER): Payer: Medicare Other | Admitting: Internal Medicine

## 2020-02-22 ENCOUNTER — Other Ambulatory Visit: Payer: Self-pay

## 2020-02-22 VITALS — BP 124/64 | HR 71 | Ht 62.0 in | Wt 192.8 lb

## 2020-02-22 DIAGNOSIS — I4821 Permanent atrial fibrillation: Secondary | ICD-10-CM | POA: Diagnosis not present

## 2020-02-22 DIAGNOSIS — Z95810 Presence of automatic (implantable) cardiac defibrillator: Secondary | ICD-10-CM

## 2020-02-22 DIAGNOSIS — I469 Cardiac arrest, cause unspecified: Secondary | ICD-10-CM | POA: Diagnosis not present

## 2020-02-22 DIAGNOSIS — I5032 Chronic diastolic (congestive) heart failure: Secondary | ICD-10-CM | POA: Diagnosis not present

## 2020-02-22 LAB — CUP PACEART INCLINIC DEVICE CHECK
Date Time Interrogation Session: 20211004172042
HighPow Impedance: 44 Ohm
Implantable Lead Implant Date: 20000223
Implantable Lead Location: 753860
Implantable Lead Model: 144
Implantable Lead Serial Number: 331438
Implantable Pulse Generator Implant Date: 20180115
Lead Channel Impedance Value: 1175 Ohm
Lead Channel Pacing Threshold Amplitude: 1.2 V
Lead Channel Pacing Threshold Pulse Width: 0.4 ms
Lead Channel Setting Pacing Amplitude: 2.5 V
Lead Channel Setting Pacing Pulse Width: 0.4 ms
Lead Channel Setting Sensing Sensitivity: 0.5 mV
Pulse Gen Serial Number: 195683

## 2020-02-22 NOTE — Patient Instructions (Signed)
Medication Instructions:  Your physician recommends that you continue on your current medications as directed. Please refer to the Current Medication list given to you today.  Please review your medication bottles at home and write down your current medications and dosages.   *If you need a refill on your cardiac medications before your next appointment, please call your pharmacy*   Lab Work: None ordered.  If you have labs (blood work) drawn today and your tests are completely normal, you will receive your results only by: [name] MyChart Message (if you have MyChart) OR . A paper copy in the mail If you have any lab test that is abnormal or we need to change your treatment, we will call you to review the results.   Testing/Procedures: None ordered.    Follow-Up: At [facility], you and your health needs are our priority.  As part of our continuing mission to provide you with exceptional heart care, we have created designated Provider Care Teams.  These Care Teams include your primary Cardiologist (physician) and Advanced Practice Providers (APPs -  Physician Assistants and Nurse Practitioners) who all work together to provide you with the care you need, when you need it.  We recommend signing up for the patient portal called "MyChart".  Sign up information is provided on this After Visit Summary.  MyChart is used to connect with patients for Virtual Visits (Telemedicine).  Patients are able to view lab/test results, encounter notes, upcoming appointments, etc.  Non-urgent messages can be sent to your provider as well.   To learn more about what you can do with MyChart, go to [email or web address].    Your next appointment:   12 month(s)  The format for your next appointment:   In Person  Provider:   Sherryl Manges, MD or APP
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
## 2020-02-26 ENCOUNTER — Other Ambulatory Visit: Payer: Self-pay | Admitting: Family Medicine

## 2020-03-14 ENCOUNTER — Other Ambulatory Visit: Payer: Self-pay | Admitting: Cardiovascular Disease

## 2020-03-18 ENCOUNTER — Ambulatory Visit (INDEPENDENT_AMBULATORY_CARE_PROVIDER_SITE_OTHER): Payer: Medicare Other | Admitting: Pharmacist Clinician (PhC)/ Clinical Pharmacy Specialist

## 2020-03-18 ENCOUNTER — Other Ambulatory Visit: Payer: Self-pay

## 2020-03-18 DIAGNOSIS — I4821 Permanent atrial fibrillation: Secondary | ICD-10-CM | POA: Diagnosis not present

## 2020-03-18 DIAGNOSIS — Z7901 Long term (current) use of anticoagulants: Secondary | ICD-10-CM

## 2020-03-18 LAB — POCT INR: INR: 2.4 (ref 2.0–3.0)

## 2020-03-28 ENCOUNTER — Ambulatory Visit (INDEPENDENT_AMBULATORY_CARE_PROVIDER_SITE_OTHER): Payer: Medicare Other | Admitting: Family Medicine

## 2020-03-28 ENCOUNTER — Encounter: Payer: Self-pay | Admitting: Family Medicine

## 2020-03-28 ENCOUNTER — Other Ambulatory Visit: Payer: Self-pay

## 2020-03-28 VITALS — BP 131/75 | HR 69 | Temp 97.6°F | Wt 192.0 lb

## 2020-03-28 DIAGNOSIS — E7800 Pure hypercholesterolemia, unspecified: Secondary | ICD-10-CM | POA: Diagnosis not present

## 2020-03-28 DIAGNOSIS — N183 Chronic kidney disease, stage 3 unspecified: Secondary | ICD-10-CM

## 2020-03-28 DIAGNOSIS — M170 Bilateral primary osteoarthritis of knee: Secondary | ICD-10-CM | POA: Diagnosis not present

## 2020-03-28 DIAGNOSIS — I10 Essential (primary) hypertension: Secondary | ICD-10-CM | POA: Diagnosis not present

## 2020-03-28 DIAGNOSIS — R609 Edema, unspecified: Secondary | ICD-10-CM | POA: Diagnosis not present

## 2020-03-28 DIAGNOSIS — K5909 Other constipation: Secondary | ICD-10-CM | POA: Diagnosis not present

## 2020-03-28 DIAGNOSIS — R2681 Unsteadiness on feet: Secondary | ICD-10-CM | POA: Diagnosis not present

## 2020-03-28 LAB — BASIC METABOLIC PANEL
BUN: 38 mg/dL — ABNORMAL HIGH (ref 6–23)
CO2: 28 mEq/L (ref 19–32)
Calcium: 10 mg/dL (ref 8.4–10.5)
Chloride: 96 mEq/L (ref 96–112)
Creatinine, Ser: 1.89 mg/dL — ABNORMAL HIGH (ref 0.40–1.50)
GFR: 33.72 mL/min — ABNORMAL LOW (ref >60.00)
Glucose, Bld: 53 mg/dL — ABNORMAL LOW (ref 70–99)
Potassium: 5.2 mEq/L — ABNORMAL HIGH (ref 3.5–5.1)
Sodium: 135 mEq/L (ref 135–145)
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
## 2020-03-28 NOTE — Progress Notes (Signed)
See med student note above. Agree---all chronic issues stable, fluid balance good, Pt REALLY needs to try to stay active to keep risk of falls to a minimum. Senakot S generic, 2 tabs qd for constipation.  Signed:  Santiago Bumpers, MD           03/28/2020

## 2020-03-29 ENCOUNTER — Other Ambulatory Visit: Payer: Self-pay

## 2020-03-29 DIAGNOSIS — E875 Hyperkalemia: Secondary | ICD-10-CM

## 2020-03-30 ENCOUNTER — Telehealth: Payer: Self-pay

## 2020-03-30 NOTE — Telephone Encounter (Signed)
Verified pt understanding and scheduled lab visit  

## 2020-03-30 NOTE — Telephone Encounter (Signed)
Returning call from yesterday.  Patient will be available today to speak with.  Patients number is [phone]

## 2020-04-13 ENCOUNTER — Other Ambulatory Visit: Payer: Self-pay

## 2020-04-13 ENCOUNTER — Ambulatory Visit (INDEPENDENT_AMBULATORY_CARE_PROVIDER_SITE_OTHER): Payer: Medicare Other

## 2020-04-13 DIAGNOSIS — E875 Hyperkalemia: Secondary | ICD-10-CM | POA: Diagnosis not present

## 2020-04-13 LAB — BASIC METABOLIC PANEL
BUN: 51 mg/dL — ABNORMAL HIGH (ref 6–23)
CO2: 30 mEq/L (ref 19–32)
Calcium: 9.7 mg/dL (ref 8.4–10.5)
Chloride: 98 mEq/L (ref 96–112)
Creatinine, Ser: 2.2 mg/dL — ABNORMAL HIGH (ref 0.40–1.50)
GFR: 28.09 mL/min — ABNORMAL LOW (ref >60.00)
Glucose, Bld: 81 mg/dL (ref 70–99)
Potassium: 4.6 mEq/L (ref 3.5–5.1)
Sodium: 137 mEq/L (ref 135–145)

## 2020-04-24 ENCOUNTER — Other Ambulatory Visit: Payer: Self-pay | Admitting: Cardiovascular Disease

## 2020-04-26 ENCOUNTER — Ambulatory Visit (INDEPENDENT_AMBULATORY_CARE_PROVIDER_SITE_OTHER): Payer: Medicare Other

## 2020-04-26 DIAGNOSIS — I442 Atrioventricular block, complete: Secondary | ICD-10-CM | POA: Diagnosis not present

## 2020-04-26 LAB — CUP PACEART REMOTE DEVICE CHECK
Battery Remaining Longevity: 138 mo
Battery Remaining Percentage: 100 %
Brady Statistic RV Percent Paced: 99 %
Date Time Interrogation Session: 20211207045100
HighPow Impedance: 37 Ohm
Implantable Lead Implant Date: 20000223
Implantable Lead Location: 753860
Implantable Lead Model: 144
Implantable Lead Serial Number: 331438
Implantable Pulse Generator Implant Date: 20180115
Lead Channel Impedance Value: 999 Ohm
Lead Channel Pacing Threshold Amplitude: 1.2 V
Lead Channel Pacing Threshold Pulse Width: 0.4 ms
Lead Channel Setting Pacing Amplitude: 2.5 V
Lead Channel Setting Pacing Pulse Width: 0.4 ms
Lead Channel Setting Sensing Sensitivity: 0.5 mV
Pulse Gen Serial Number: 195683

## 2020-04-29 ENCOUNTER — Ambulatory Visit (INDEPENDENT_AMBULATORY_CARE_PROVIDER_SITE_OTHER): Payer: Medicare Other

## 2020-04-29 ENCOUNTER — Other Ambulatory Visit: Payer: Self-pay

## 2020-04-29 DIAGNOSIS — I4821 Permanent atrial fibrillation: Secondary | ICD-10-CM

## 2020-04-29 DIAGNOSIS — Z7901 Long term (current) use of anticoagulants: Secondary | ICD-10-CM | POA: Diagnosis not present

## 2020-04-29 DIAGNOSIS — Z5181 Encounter for therapeutic drug level monitoring: Secondary | ICD-10-CM | POA: Diagnosis not present

## 2020-04-29 LAB — POCT INR: INR: 3.4 — AB (ref 2.0–3.0)

## 2020-04-29 NOTE — Patient Instructions (Signed)
Hold tonight and Continue taking 1 tablet each Monday, Wednesdays and Friday, 1/2 tablet all other days. Repeat INR in 6 weeks

## 2020-05-06 NOTE — Progress Notes (Signed)
Remote ICD transmission.   

## 2020-05-24 ENCOUNTER — Emergency Department (HOSPITAL_COMMUNITY)
Admission: EM | Admit: 2020-05-24 | Discharge: 2020-05-24 | Disposition: A | Discharge status: Home / Self Care | Payer: Medicare Other | Attending: Emergency Medicine | Admitting: Emergency Medicine

## 2020-05-24 ENCOUNTER — Other Ambulatory Visit: Payer: Self-pay

## 2020-05-24 ENCOUNTER — Emergency Department (HOSPITAL_COMMUNITY): Payer: Medicare Other

## 2020-05-24 DIAGNOSIS — N183 Chronic kidney disease, stage 3 unspecified: Secondary | ICD-10-CM | POA: Insufficient documentation

## 2020-05-24 DIAGNOSIS — I959 Hypotension, unspecified: Secondary | ICD-10-CM | POA: Diagnosis not present

## 2020-05-24 DIAGNOSIS — R4182 Altered mental status, unspecified: Secondary | ICD-10-CM | POA: Diagnosis not present

## 2020-05-24 DIAGNOSIS — Y92410 Unspecified street and highway as the place of occurrence of the external cause: Secondary | ICD-10-CM | POA: Diagnosis not present

## 2020-05-24 DIAGNOSIS — S0990XA Unspecified injury of head, initial encounter: Secondary | ICD-10-CM | POA: Diagnosis not present

## 2020-05-24 DIAGNOSIS — I5032 Chronic diastolic (congestive) heart failure: Secondary | ICD-10-CM | POA: Insufficient documentation

## 2020-05-24 DIAGNOSIS — G9389 Other specified disorders of brain: Secondary | ICD-10-CM | POA: Diagnosis not present

## 2020-05-24 DIAGNOSIS — Z7901 Long term (current) use of anticoagulants: Secondary | ICD-10-CM | POA: Diagnosis not present

## 2020-05-24 DIAGNOSIS — I10 Essential (primary) hypertension: Secondary | ICD-10-CM | POA: Diagnosis not present

## 2020-05-24 DIAGNOSIS — I6529 Occlusion and stenosis of unspecified carotid artery: Secondary | ICD-10-CM | POA: Diagnosis not present

## 2020-05-24 DIAGNOSIS — G319 Degenerative disease of nervous system, unspecified: Secondary | ICD-10-CM | POA: Diagnosis not present

## 2020-05-24 DIAGNOSIS — Z79899 Other long term (current) drug therapy: Secondary | ICD-10-CM | POA: Diagnosis not present

## 2020-05-24 DIAGNOSIS — I129 Hypertensive chronic kidney disease with stage 1 through stage 4 chronic kidney disease, or unspecified chronic kidney disease: Secondary | ICD-10-CM | POA: Insufficient documentation

## 2020-05-24 DIAGNOSIS — R410 Disorientation, unspecified: Secondary | ICD-10-CM | POA: Diagnosis not present

## 2020-05-24 DIAGNOSIS — Z0000 Encounter for general adult medical examination without abnormal findings: Secondary | ICD-10-CM | POA: Diagnosis not present

## 2020-05-24 LAB — COMPREHENSIVE METABOLIC PANEL
ALT: 14 U/L (ref 0–44)
AST: 18 U/L (ref 15–41)
Albumin: 4.1 g/dL (ref 3.5–5.0)
Alkaline Phosphatase: 60 U/L (ref 38–126)
Anion gap: 12 (ref 5–15)
BUN: 40 mg/dL — ABNORMAL HIGH (ref 8–23)
CO2: 26 mmol/L (ref 22–32)
Calcium: 10.2 mg/dL (ref 8.9–10.3)
Chloride: 97 mmol/L — ABNORMAL LOW (ref 98–111)
Creatinine, Ser: 1.79 mg/dL — ABNORMAL HIGH (ref 0.61–1.24)
GFR, Estimated: 38 mL/min — ABNORMAL LOW (ref >60)
Glucose, Bld: 104 mg/dL — ABNORMAL HIGH (ref 70–99)
Potassium: 4.2 mmol/L (ref 3.5–5.1)
Sodium: 135 mmol/L (ref 135–145)
Total Bilirubin: 1.1 mg/dL (ref 0.3–1.2)
Total Protein: 7.4 g/dL (ref 6.5–8.1)

## 2020-05-24 LAB — CBC
HCT: 36.7 % — ABNORMAL LOW (ref 39.0–52.0)
Hemoglobin: 11.4 g/dL — ABNORMAL LOW (ref 13.0–17.0)
MCH: 28.9 pg (ref 26.0–34.0)
MCHC: 31.1 g/dL (ref 30.0–36.0)
MCV: 92.9 fL (ref 80.0–100.0)
Platelets: 269 10*3/uL (ref 150–400)
RBC: 3.95 MIL/uL — ABNORMAL LOW (ref 4.22–5.81)
RDW: 13.5 % (ref 11.5–15.5)
WBC: 8.1 10*3/uL (ref 4.0–10.5)
nRBC: 0 % (ref 0.0–0.2)

## 2020-05-24 LAB — PROTIME-INR
INR: 2.9 — ABNORMAL HIGH (ref 0.8–1.2)
Prothrombin Time: 29 seconds — ABNORMAL HIGH (ref 11.4–15.2)
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
## 2020-05-24 NOTE — ED Notes (Signed)
Pt transported to CT ?
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
## 2020-06-14 ENCOUNTER — Emergency Department (HOSPITAL_COMMUNITY): Payer: Medicare Other

## 2020-06-14 ENCOUNTER — Inpatient Hospital Stay (HOSPITAL_COMMUNITY)
Admission: EM | Admit: 2020-06-14 | Discharge: 2020-06-24 | DRG: 564 | Disposition: A | Discharge status: Skilled Nursing Facility | Payer: Medicare Other | Attending: Internal Medicine | Admitting: Internal Medicine

## 2020-06-14 ENCOUNTER — Encounter (HOSPITAL_COMMUNITY): Payer: Self-pay | Admitting: Emergency Medicine

## 2020-06-14 DIAGNOSIS — N183 Chronic kidney disease, stage 3 unspecified: Secondary | ICD-10-CM | POA: Diagnosis present

## 2020-06-14 DIAGNOSIS — Z8249 Family history of ischemic heart disease and other diseases of the circulatory system: Secondary | ICD-10-CM

## 2020-06-14 DIAGNOSIS — R748 Abnormal levels of other serum enzymes: Secondary | ICD-10-CM | POA: Diagnosis present

## 2020-06-14 DIAGNOSIS — Z79899 Other long term (current) drug therapy: Secondary | ICD-10-CM

## 2020-06-14 DIAGNOSIS — U071 COVID-19: Secondary | ICD-10-CM | POA: Diagnosis present

## 2020-06-14 DIAGNOSIS — E860 Dehydration: Secondary | ICD-10-CM | POA: Diagnosis present

## 2020-06-14 DIAGNOSIS — M170 Bilateral primary osteoarthritis of knee: Secondary | ICD-10-CM | POA: Diagnosis present

## 2020-06-14 DIAGNOSIS — Z8679 Personal history of other diseases of the circulatory system: Secondary | ICD-10-CM

## 2020-06-14 DIAGNOSIS — T796XXA Traumatic ischemia of muscle, initial encounter: Secondary | ICD-10-CM | POA: Diagnosis not present

## 2020-06-14 DIAGNOSIS — J9601 Acute respiratory failure with hypoxia: Secondary | ICD-10-CM | POA: Diagnosis not present

## 2020-06-14 DIAGNOSIS — I130 Hypertensive heart and chronic kidney disease with heart failure and stage 1 through stage 4 chronic kidney disease, or unspecified chronic kidney disease: Secondary | ICD-10-CM | POA: Diagnosis present

## 2020-06-14 DIAGNOSIS — Z8674 Personal history of sudden cardiac arrest: Secondary | ICD-10-CM

## 2020-06-14 DIAGNOSIS — Z66 Do not resuscitate: Secondary | ICD-10-CM | POA: Diagnosis not present

## 2020-06-14 DIAGNOSIS — I10 Essential (primary) hypertension: Secondary | ICD-10-CM | POA: Diagnosis not present

## 2020-06-14 DIAGNOSIS — E43 Unspecified severe protein-calorie malnutrition: Secondary | ICD-10-CM | POA: Insufficient documentation

## 2020-06-14 DIAGNOSIS — S0990XA Unspecified injury of head, initial encounter: Secondary | ICD-10-CM | POA: Diagnosis not present

## 2020-06-14 DIAGNOSIS — R531 Weakness: Secondary | ICD-10-CM

## 2020-06-14 DIAGNOSIS — N179 Acute kidney failure, unspecified: Secondary | ICD-10-CM | POA: Diagnosis not present

## 2020-06-14 DIAGNOSIS — G4733 Obstructive sleep apnea (adult) (pediatric): Secondary | ICD-10-CM | POA: Diagnosis present

## 2020-06-14 DIAGNOSIS — R0902 Hypoxemia: Secondary | ICD-10-CM

## 2020-06-14 DIAGNOSIS — N1832 Chronic kidney disease, stage 3b: Secondary | ICD-10-CM | POA: Diagnosis present

## 2020-06-14 DIAGNOSIS — R5381 Other malaise: Secondary | ICD-10-CM | POA: Diagnosis present

## 2020-06-14 DIAGNOSIS — E875 Hyperkalemia: Secondary | ICD-10-CM | POA: Diagnosis present

## 2020-06-14 DIAGNOSIS — G319 Degenerative disease of nervous system, unspecified: Secondary | ICD-10-CM | POA: Diagnosis not present

## 2020-06-14 DIAGNOSIS — I5033 Acute on chronic diastolic (congestive) heart failure: Secondary | ICD-10-CM | POA: Diagnosis present

## 2020-06-14 DIAGNOSIS — I5032 Chronic diastolic (congestive) heart failure: Secondary | ICD-10-CM | POA: Diagnosis present

## 2020-06-14 DIAGNOSIS — Z9114 Patient's other noncompliance with medication regimen: Secondary | ICD-10-CM

## 2020-06-14 DIAGNOSIS — J0100 Acute maxillary sinusitis, unspecified: Secondary | ICD-10-CM | POA: Diagnosis not present

## 2020-06-14 DIAGNOSIS — R296 Repeated falls: Secondary | ICD-10-CM

## 2020-06-14 DIAGNOSIS — L89156 Pressure-induced deep tissue damage of sacral region: Secondary | ICD-10-CM | POA: Diagnosis present

## 2020-06-14 DIAGNOSIS — Z7901 Long term (current) use of anticoagulants: Secondary | ICD-10-CM

## 2020-06-14 DIAGNOSIS — J0120 Acute ethmoidal sinusitis, unspecified: Secondary | ICD-10-CM | POA: Diagnosis not present

## 2020-06-14 DIAGNOSIS — W19XXXA Unspecified fall, initial encounter: Secondary | ICD-10-CM | POA: Diagnosis present

## 2020-06-14 DIAGNOSIS — Z9181 History of falling: Secondary | ICD-10-CM

## 2020-06-14 DIAGNOSIS — I4821 Permanent atrial fibrillation: Secondary | ICD-10-CM | POA: Diagnosis present

## 2020-06-14 DIAGNOSIS — E785 Hyperlipidemia, unspecified: Secondary | ICD-10-CM | POA: Diagnosis present

## 2020-06-14 DIAGNOSIS — K219 Gastro-esophageal reflux disease without esophagitis: Secondary | ICD-10-CM | POA: Diagnosis present

## 2020-06-14 DIAGNOSIS — E440 Moderate protein-calorie malnutrition: Secondary | ICD-10-CM | POA: Diagnosis present

## 2020-06-14 DIAGNOSIS — R627 Adult failure to thrive: Secondary | ICD-10-CM | POA: Diagnosis present

## 2020-06-14 DIAGNOSIS — Z95810 Presence of automatic (implantable) cardiac defibrillator: Secondary | ICD-10-CM | POA: Diagnosis present

## 2020-06-14 DIAGNOSIS — G4730 Sleep apnea, unspecified: Secondary | ICD-10-CM | POA: Diagnosis present

## 2020-06-14 LAB — URINALYSIS, ROUTINE W REFLEX MICROSCOPIC
Bilirubin Urine: NEGATIVE
Glucose, UA: NEGATIVE mg/dL
Hgb urine dipstick: NEGATIVE
Ketones, ur: NEGATIVE mg/dL
Leukocytes,Ua: NEGATIVE
Nitrite: NEGATIVE
Protein, ur: NEGATIVE mg/dL
Specific Gravity, Urine: 1.011 (ref 1.005–1.030)
pH: 5 (ref 5.0–8.0)

## 2020-06-14 LAB — COMPREHENSIVE METABOLIC PANEL
ALT: 18 U/L (ref 0–44)
AST: 58 U/L — ABNORMAL HIGH (ref 15–41)
Albumin: 3.4 g/dL — ABNORMAL LOW (ref 3.5–5.0)
Alkaline Phosphatase: 51 U/L (ref 38–126)
Anion gap: 18 — ABNORMAL HIGH (ref 5–15)
BUN: 63 mg/dL — ABNORMAL HIGH (ref 8–23)
CO2: 20 mmol/L — ABNORMAL LOW (ref 22–32)
Calcium: 10.1 mg/dL (ref 8.9–10.3)
Chloride: 96 mmol/L — ABNORMAL LOW (ref 98–111)
Creatinine, Ser: 2.86 mg/dL — ABNORMAL HIGH (ref 0.61–1.24)
GFR, Estimated: 22 mL/min — ABNORMAL LOW (ref >60)
Glucose, Bld: 90 mg/dL (ref 70–99)
Potassium: 4.7 mmol/L (ref 3.5–5.1)
Sodium: 134 mmol/L — ABNORMAL LOW (ref 135–145)
Total Bilirubin: 1.7 mg/dL — ABNORMAL HIGH (ref 0.3–1.2)
Total Protein: 6.7 g/dL (ref 6.5–8.1)

## 2020-06-14 LAB — CBC WITH DIFFERENTIAL/PLATELET
Abs Immature Granulocytes: 0.12 10*3/uL — ABNORMAL HIGH (ref 0.00–0.07)
Basophils Absolute: 0 10*3/uL (ref 0.0–0.1)
Basophils Relative: 0 %
Eosinophils Absolute: 0 10*3/uL (ref 0.0–0.5)
Eosinophils Relative: 0 %
HCT: 35.3 % — ABNORMAL LOW (ref 39.0–52.0)
Hemoglobin: 11.1 g/dL — ABNORMAL LOW (ref 13.0–17.0)
Immature Granulocytes: 1 %
Lymphocytes Relative: 9 %
Lymphs Abs: 1.2 10*3/uL (ref 0.7–4.0)
MCH: 28 pg (ref 26.0–34.0)
MCHC: 31.4 g/dL (ref 30.0–36.0)
MCV: 89.1 fL (ref 80.0–100.0)
Monocytes Absolute: 1.5 10*3/uL — ABNORMAL HIGH (ref 0.1–1.0)
Monocytes Relative: 12 %
Neutro Abs: 10.3 10*3/uL — ABNORMAL HIGH (ref 1.7–7.7)
Neutrophils Relative %: 78 %
Platelets: 326 10*3/uL (ref 150–400)
RBC: 3.96 MIL/uL — ABNORMAL LOW (ref 4.22–5.81)
RDW: 13.3 % (ref 11.5–15.5)
WBC: 13.2 10*3/uL — ABNORMAL HIGH (ref 4.0–10.5)
nRBC: 0 % (ref 0.0–0.2)

## 2020-06-14 LAB — PROTIME-INR
INR: 4.6 (ref 0.8–1.2)
Prothrombin Time: 42.3 seconds — ABNORMAL HIGH (ref 11.4–15.2)

## 2020-06-14 LAB — CK: Total CK: 2236 U/L — ABNORMAL HIGH (ref 49–397)

## 2020-06-14 LAB — SARS CORONAVIRUS 2 (TAT 6-24 HRS): SARS Coronavirus 2: POSITIVE — AB

## 2020-06-14 MED ORDER — HYDRALAZINE HCL 20 MG/ML IJ SOLN: 5.0000 mg | INTRAMUSCULAR | Status: DC | PRN

## 2020-06-14 MED ORDER — SODIUM CHLORIDE 0.9 % IV BOLUS
1000.0000 mL | Freq: Once | INTRAVENOUS | Status: AC
  Administered 2020-06-14: 1000 mL via INTRAVENOUS

## 2020-06-14 MED ORDER — LORATADINE 10 MG PO TABS
10.0000 mg | ORAL_TABLET | Freq: Every evening | ORAL | Status: DC
  Administered 2020-06-14 – 2020-06-24 (×10): 10 mg via ORAL
  Filled 2020-06-14 (×10): qty 1

## 2020-06-14 MED ORDER — ONDANSETRON HCL 4 MG PO TABS: 4.0000 mg | ORAL_TABLET | Freq: Four times a day (QID) | ORAL | Status: DC | PRN

## 2020-06-14 MED ORDER — DOCUSATE SODIUM 100 MG PO CAPS
100.0000 mg | ORAL_CAPSULE | Freq: Two times a day (BID) | ORAL | Status: DC
  Administered 2020-06-14 – 2020-06-24 (×20): 100 mg via ORAL
  Filled 2020-06-14 (×21): qty 1

## 2020-06-14 MED ORDER — MORPHINE SULFATE (PF) 2 MG/ML IV SOLN
2.0000 mg | INTRAVENOUS | Status: DC | PRN
Start: 2020-06-14 — End: 2020-06-20

## 2020-06-14 MED ORDER — PANTOPRAZOLE SODIUM 40 MG PO TBEC
40.0000 mg | DELAYED_RELEASE_TABLET | Freq: Two times a day (BID) | ORAL | Status: DC
  Administered 2020-06-14 – 2020-06-24 (×21): 40 mg via ORAL
  Filled 2020-06-14 (×21): qty 1

## 2020-06-14 MED ORDER — SIMVASTATIN 20 MG PO TABS: 20.0000 mg | ORAL_TABLET | Freq: Every day | ORAL | Status: DC

## 2020-06-14 MED ORDER — ACETAMINOPHEN 325 MG PO TABS
650.0000 mg | ORAL_TABLET | Freq: Four times a day (QID) | ORAL | Status: DC | PRN
  Administered 2020-06-15 – 2020-06-24 (×10): 650 mg via ORAL
  Filled 2020-06-14 (×11): qty 2

## 2020-06-14 MED ORDER — HYDROCODONE-ACETAMINOPHEN 5-325 MG PO TABS
1.0000 | ORAL_TABLET | ORAL | Status: DC | PRN
  Administered 2020-06-16 – 2020-06-19 (×4): 1 via ORAL
  Administered 2020-06-20: 2 via ORAL
  Filled 2020-06-14: qty 1
  Filled 2020-06-14: qty 2
  Filled 2020-06-14 (×3): qty 1

## 2020-06-14 MED ORDER — LEVOCETIRIZINE DIHYDROCHLORIDE 5 MG PO TABS: 5.0000 mg | ORAL_TABLET | Freq: Every evening | ORAL | Status: DC

## 2020-06-14 MED ORDER — BISACODYL 5 MG PO TBEC
5.0000 mg | DELAYED_RELEASE_TABLET | Freq: Every day | ORAL | Status: DC | PRN
Start: 2020-06-14 — End: 2020-06-25

## 2020-06-14 MED ORDER — ACETAMINOPHEN 650 MG RE SUPP: 650.0000 mg | Freq: Four times a day (QID) | RECTAL | Status: DC | PRN

## 2020-06-14 MED ORDER — LACTATED RINGERS IV SOLN: INTRAVENOUS | Status: DC

## 2020-06-14 MED ORDER — ONDANSETRON HCL 4 MG/2ML IJ SOLN: 4.0000 mg | Freq: Four times a day (QID) | INTRAMUSCULAR | Status: DC | PRN

## 2020-06-14 MED ORDER — POLYETHYLENE GLYCOL 3350 17 G PO PACK: 17.0000 g | PACK | Freq: Every day | ORAL | Status: DC | PRN

## 2020-06-14 MED ORDER — WARFARIN - PHARMACIST DOSING INPATIENT: Freq: Every day | Status: DC
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
## 2020-06-14 NOTE — ED Triage Notes (Signed)
Pt arrives via ems from home with c/o multiple falls, states that patient laid on his floor all night after last fall. Ems was called out last night but patient refused to go to ED last night. EMS VSS 138/80, HR 74, 100%ra. Pt does have defibrillator in place r/t cardiac arrest in 2000. A/ox4. Community paramedic is familiar with patient and requests to be called upon patient's dc.

## 2020-06-14 NOTE — ED Notes (Signed)
Patient assisted with bed pan use, area of skin breakdown noted to sacral region. Patient reports a large amount of sitting at home, education provided regarding same. Patient's linen changed and assisted with repositioning. Denies further needs.
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
## 2020-06-14 NOTE — ED Notes (Signed)
Pt accidentally urinated a little on his pants. Asked pt if we could put him in a gown and take his pants off. Pt refused states he does not want to put a gown on or take off his pants.

## 2020-06-14 NOTE — ED Notes (Addendum)
x2 unsuccessful attempts to draw labs  Pt taken to CT scan

## 2020-06-14 NOTE — ED Notes (Signed)
Patient assisted with repositioning for eating dinner. Patient feeding self at this time.

## 2020-06-14 NOTE — ED Notes (Signed)
Pt resting comfortably in bed, continues to deny pain at this time Respirations are even and non-labored  Skin is warm, dry and intact  Pt provided water and orange juice
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
## 2020-06-15 DIAGNOSIS — E440 Moderate protein-calorie malnutrition: Secondary | ICD-10-CM | POA: Diagnosis present

## 2020-06-15 DIAGNOSIS — R296 Repeated falls: Secondary | ICD-10-CM | POA: Diagnosis present

## 2020-06-15 DIAGNOSIS — N1832 Chronic kidney disease, stage 3b: Secondary | ICD-10-CM | POA: Diagnosis present

## 2020-06-15 DIAGNOSIS — Z7401 Bed confinement status: Secondary | ICD-10-CM | POA: Diagnosis not present

## 2020-06-15 DIAGNOSIS — R5381 Other malaise: Secondary | ICD-10-CM | POA: Diagnosis present

## 2020-06-15 DIAGNOSIS — K219 Gastro-esophageal reflux disease without esophagitis: Secondary | ICD-10-CM | POA: Diagnosis present

## 2020-06-15 DIAGNOSIS — R0902 Hypoxemia: Secondary | ICD-10-CM | POA: Diagnosis not present

## 2020-06-15 DIAGNOSIS — I517 Cardiomegaly: Secondary | ICD-10-CM | POA: Diagnosis not present

## 2020-06-15 DIAGNOSIS — Z95810 Presence of automatic (implantable) cardiac defibrillator: Secondary | ICD-10-CM | POA: Diagnosis not present

## 2020-06-15 DIAGNOSIS — R748 Abnormal levels of other serum enzymes: Secondary | ICD-10-CM | POA: Diagnosis present

## 2020-06-15 DIAGNOSIS — U071 COVID-19: Secondary | ICD-10-CM | POA: Diagnosis present

## 2020-06-15 DIAGNOSIS — I10 Essential (primary) hypertension: Secondary | ICD-10-CM | POA: Diagnosis present

## 2020-06-15 DIAGNOSIS — N179 Acute kidney failure, unspecified: Secondary | ICD-10-CM | POA: Diagnosis present

## 2020-06-15 DIAGNOSIS — I130 Hypertensive heart and chronic kidney disease with heart failure and stage 1 through stage 4 chronic kidney disease, or unspecified chronic kidney disease: Secondary | ICD-10-CM | POA: Diagnosis present

## 2020-06-15 DIAGNOSIS — J811 Chronic pulmonary edema: Secondary | ICD-10-CM | POA: Diagnosis not present

## 2020-06-15 DIAGNOSIS — M6281 Muscle weakness (generalized): Secondary | ICD-10-CM | POA: Diagnosis present

## 2020-06-15 DIAGNOSIS — M170 Bilateral primary osteoarthritis of knee: Secondary | ICD-10-CM | POA: Diagnosis present

## 2020-06-15 DIAGNOSIS — R627 Adult failure to thrive: Secondary | ICD-10-CM | POA: Diagnosis present

## 2020-06-15 DIAGNOSIS — Z79899 Other long term (current) drug therapy: Secondary | ICD-10-CM | POA: Diagnosis not present

## 2020-06-15 DIAGNOSIS — G4730 Sleep apnea, unspecified: Secondary | ICD-10-CM | POA: Diagnosis present

## 2020-06-15 DIAGNOSIS — R531 Weakness: Secondary | ICD-10-CM | POA: Diagnosis not present

## 2020-06-15 DIAGNOSIS — R2681 Unsteadiness on feet: Secondary | ICD-10-CM | POA: Diagnosis present

## 2020-06-15 DIAGNOSIS — E860 Dehydration: Secondary | ICD-10-CM | POA: Diagnosis present

## 2020-06-15 DIAGNOSIS — W19XXXA Unspecified fall, initial encounter: Secondary | ICD-10-CM | POA: Diagnosis present

## 2020-06-15 DIAGNOSIS — I5032 Chronic diastolic (congestive) heart failure: Secondary | ICD-10-CM | POA: Diagnosis present

## 2020-06-15 DIAGNOSIS — T796XXA Traumatic ischemia of muscle, initial encounter: Secondary | ICD-10-CM | POA: Diagnosis present

## 2020-06-15 DIAGNOSIS — J9601 Acute respiratory failure with hypoxia: Secondary | ICD-10-CM | POA: Diagnosis not present

## 2020-06-15 DIAGNOSIS — I5033 Acute on chronic diastolic (congestive) heart failure: Secondary | ICD-10-CM | POA: Diagnosis present

## 2020-06-15 DIAGNOSIS — N1831 Chronic kidney disease, stage 3a: Secondary | ICD-10-CM | POA: Diagnosis present

## 2020-06-15 DIAGNOSIS — I959 Hypotension, unspecified: Secondary | ICD-10-CM | POA: Diagnosis not present

## 2020-06-15 DIAGNOSIS — E785 Hyperlipidemia, unspecified: Secondary | ICD-10-CM | POA: Diagnosis present

## 2020-06-15 DIAGNOSIS — Z7901 Long term (current) use of anticoagulants: Secondary | ICD-10-CM | POA: Diagnosis not present

## 2020-06-15 DIAGNOSIS — M2550 Pain in unspecified joint: Secondary | ICD-10-CM | POA: Diagnosis not present

## 2020-06-15 DIAGNOSIS — R2689 Other abnormalities of gait and mobility: Secondary | ICD-10-CM | POA: Diagnosis present

## 2020-06-15 DIAGNOSIS — Z8249 Family history of ischemic heart disease and other diseases of the circulatory system: Secondary | ICD-10-CM | POA: Diagnosis not present

## 2020-06-15 DIAGNOSIS — Z8674 Personal history of sudden cardiac arrest: Secondary | ICD-10-CM | POA: Diagnosis not present

## 2020-06-15 DIAGNOSIS — Z66 Do not resuscitate: Secondary | ICD-10-CM | POA: Diagnosis present

## 2020-06-15 DIAGNOSIS — I4821 Permanent atrial fibrillation: Secondary | ICD-10-CM | POA: Diagnosis present

## 2020-06-15 DIAGNOSIS — G4733 Obstructive sleep apnea (adult) (pediatric): Secondary | ICD-10-CM | POA: Diagnosis present

## 2020-06-15 LAB — CBC
HCT: 28.7 % — ABNORMAL LOW (ref 39.0–52.0)
Hemoglobin: 9.6 g/dL — ABNORMAL LOW (ref 13.0–17.0)
MCH: 29.3 pg (ref 26.0–34.0)
MCHC: 33.4 g/dL (ref 30.0–36.0)
MCV: 87.5 fL (ref 80.0–100.0)
Platelets: 217 10*3/uL (ref 150–400)
RBC: 3.28 MIL/uL — ABNORMAL LOW (ref 4.22–5.81)
RDW: 13.8 % (ref 11.5–15.5)
WBC: 12.7 10*3/uL — ABNORMAL HIGH (ref 4.0–10.5)
nRBC: 0 % (ref 0.0–0.2)

## 2020-06-15 LAB — BASIC METABOLIC PANEL
Anion gap: 11 (ref 5–15)
BUN: 64 mg/dL — ABNORMAL HIGH (ref 8–23)
CO2: 20 mmol/L — ABNORMAL LOW (ref 22–32)
Calcium: 9.1 mg/dL (ref 8.9–10.3)
Chloride: 98 mmol/L (ref 98–111)
Creatinine, Ser: 2.29 mg/dL — ABNORMAL HIGH (ref 0.61–1.24)
GFR, Estimated: 29 mL/min — ABNORMAL LOW (ref >60)
Glucose, Bld: 108 mg/dL — ABNORMAL HIGH (ref 70–99)
Potassium: 5.2 mmol/L — ABNORMAL HIGH (ref 3.5–5.1)
Sodium: 129 mmol/L — ABNORMAL LOW (ref 135–145)

## 2020-06-15 LAB — PROTIME-INR
INR: 5 (ref 0.8–1.2)
Prothrombin Time: 45.1 seconds — ABNORMAL HIGH (ref 11.4–15.2)

## 2020-06-15 MED ORDER — LACTATED RINGERS IV SOLN: Freq: Once | INTRAVENOUS | Status: AC

## 2020-06-15 NOTE — ED Notes (Signed)
Lunch Tray Ordered @ 1032. 

## 2020-06-15 NOTE — ED Notes (Signed)
Dr Natale Milch paged by unit secretary

## 2020-06-15 NOTE — Progress Notes (Signed)
Physical Therapy Evaluation Patient Details Name: Ruben Walker MRN: [id number] DOB: 28-Jun-1940 Today's Date: 06/15/2020   History of Present Illness  Ruben LLIAM Walker is a 80 y.o. male with PMHx: of OSA on CPAP; OHS; pacemaker/AICD dependence with h/o cardiac arrest; HTN; HLD; stage 3b CKD; chronic diastolic CHF; and afib on Coumadin presenting with frequent falls and COVID-19.  Clinical Impression  Pt was found to have limited control of mobility and a great deal of impulsive behavior that is not safe, unrealistic.  Pt will need to work on resuming gait but with lower standing surface to accommodate his joints and weakness.  Follow along with goals of acute PT to recover function and will recommend SNF due to his debility with inability to be home alone safely currently.    Follow Up Recommendations SNF    Equipment Recommendations  None recommended by PT    Recommendations for Other Services       Precautions / Restrictions Precautions Precautions: Fall Precaution Comments: Covid+ Restrictions Weight Bearing Restrictions: No      Mobility  Bed Mobility Overal bed mobility: Needs Assistance Bed Mobility: Rolling;Supine to Sit;Sit to Supine Rolling: Mod assist   Supine to sit: Max assist Sit to supine: Max assist        Transfers                 General transfer comment: unsafe to try  Ambulation/Gait                Stairs            Wheelchair Mobility    Modified Rankin (Stroke Patients Only)       Balance Overall balance assessment: Needs assistance Sitting-balance support: Feet supported;Bilateral upper extremity supported Sitting balance-Leahy Scale: Poor Sitting balance - Comments: used LUE and LE's to hold onto sit posture                                     Pertinent Vitals/Pain Pain Assessment: Faces Pain Score: 7  Pain Location: ankles and knees Pain Descriptors / Indicators: Grimacing;Guarding Pain  Intervention(s): Limited activity within patient's tolerance;Repositioned    Home Living Family/patient expects to be discharged to:: Private residence Living Arrangements: Alone Available Help at Discharge: Family;Available PRN/intermittently Type of Home: House Home Access: Level entry       Home Equipment: Cane - single point Additional Comments: pt gives limited history    Prior Function Level of Independence: Independent with assistive device(s)         Comments: minor family help and using a cane to walk     Hand Dominance   Dominant Hand: Right    Extremity/Trunk Assessment   Upper Extremity Assessment Upper Extremity Assessment: Defer to OT evaluation    Lower Extremity Assessment Lower Extremity Assessment: Generalized weakness RLE Deficits / Details: B knee and ankle edema and stiffness       Communication   Communication: No difficulties  Cognition Arousal/Alertness: Awake/alert Behavior During Therapy: Impulsive Overall Cognitive Status: No family/caregiver present to determine baseline cognitive functioning                                 General Comments: tends to try to stand without demonstrating enough LE strength      General Comments General comments (skin integrity, edema, etc.): Pt was  up to side of bed with severely edematous knee joints, and ankles    Exercises     Assessment/Plan    PT Assessment Patient needs continued PT services  PT Problem List Decreased strength;Decreased range of motion;Decreased activity tolerance;Decreased balance;Decreased mobility;Decreased coordination;Decreased cognition;Decreased knowledge of use of DME;Decreased safety awareness;Cardiopulmonary status limiting activity;Decreased skin integrity;Pain       PT Treatment Interventions DME instruction;Gait training;Functional mobility training;Therapeutic activities;Therapeutic exercise;Neuromuscular re-education;Patient/family  education;Balance training    PT Goals (Current goals can be found in the Care Plan section)  Acute Rehab PT Goals Patient Stated Goal: get home PT Goal Formulation: With patient Time For Goal Achievement: 06/29/20 Potential to Achieve Goals: Fair    Frequency Min 3X/week   Barriers to discharge Decreased caregiver support has PRN family help    Co-evaluation               AM-PAC PT "6 Clicks" Mobility  Outcome Measure Help needed turning from your back to your side while in a flat bed without using bedrails?: A Lot Help needed moving from lying on your back to sitting on the side of a flat bed without using bedrails?: A Lot Help needed moving to and from a bed to a chair (including a wheelchair)?: A Lot Help needed standing up from a chair using your arms (e.g., wheelchair or bedside chair)?: A Lot Help needed to walk in hospital room?: A Lot Help needed climbing 3-5 steps with a railing? : Total 6 Click Score: 11    End of Session Equipment Utilized During Treatment: Oxygen Activity Tolerance: Patient tolerated treatment well;Treatment limited secondary to medical complications (Comment) Patient left: in bed;with call bell/phone within reach Nurse Communication: Mobility status PT Visit Diagnosis: Unsteadiness on feet (R26.81);Muscle weakness (generalized) (M62.81);Difficulty in walking, not elsewhere classified (R26.2)    Time: 0109-3235 PT Time Calculation (min) (ACUTE ONLY): 30 min   Charges:   PT Evaluation $PT Eval Moderate Complexity: 1 Mod PT Treatments $Therapeutic Activity: 8-22 mins       Ivar Drape 06/15/2020, 9:49 PM  Samul Dada, PT MS Acute Rehab Dept. Number: [facility] [id number] and [facility] [phone]
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
## 2020-06-15 NOTE — Progress Notes (Signed)
Pt unable to use cpap. He is covid+, but not In a negative pressure room.
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
## 2020-06-15 NOTE — Progress Notes (Signed)
Pt is tired and confused, unable to do therapy right now.  Reattempt later today.  06/15/20 1100  PT Visit Information  Reason Eval/Treat Not Completed Other (comment)    Samul Dada, PT MS Acute Rehab Dept. Number: [facility] [id number] and [facility] [phone]

## 2020-06-15 NOTE — ED Notes (Signed)
Date and time results received: 06/15/20 0758   Test: INR Critical Value: 5.0 Name of Provider Notified: Dr Natale Milch   No new orders received, will continue to monitor pt for changes

## 2020-06-15 NOTE — Evaluation (Signed)
Occupational Therapy Evaluation Patient Details Name: Ruben Walker MRN: [id number] DOB: 04/10/1941 Today's Date: 06/15/2020    History of Present Illness Ruben Walker is a 80 y.o. male with PMHx: of OSA on CPAP; OHS; pacemaker/AICD dependence with h/o cardiac arrest; HTN; HLD; stage 3b CKD; chronic diastolic CHF; and afib on Coumadin presenting with frequent falls and COVID-19.   Clinical Impression   Pt PTA: Pt was living home alone. Pt reports independence with ADL and mobility with SPC, but describes increased time for tasks. Pt currently limited by decreased strength, decreased mobility, decreased ability to care for self and decreased activity tolerance. Pt set-upA to maxA for ADL and maxA for bed mobility to EOB; pt unable to safely stand from stretcher due to tall height and weakness all over.  Pt's O2 >89-93% on RA with exertion. Pt would benefit from continued OT skilled services for ADL, mobility and safety. Pt unsafe to go home and unable to properly care for self and in need of SNF. OT following acutely.    Follow Up Recommendations  SNF    Equipment Recommendations  3 in 1 bedside commode;Wheelchair (measurements OT);Wheelchair cushion (measurements OT)    Recommendations for Other Services       Precautions / Restrictions Precautions Precautions: Fall;Other (comment) Precaution Comments: covid precs Restrictions Weight Bearing Restrictions: No      Mobility Bed Mobility Overal bed mobility: Needs Assistance Bed Mobility: Rolling;Supine to Sit;Sit to Supine Rolling: Mod assist   Supine to sit: Max assist Sit to supine: Max assist;HOB elevated   General bed mobility comments: Pt use of rails, but great difficulty sequencing and very SOB throughout tasks. Pt near totalA for LB maneuvering.    Transfers                 General transfer comment: pt unable to attempt at EOB: pt unable to near weight without RW and +2 physical assist    Balance  Overall balance assessment: Needs assistance Sitting-balance support: Bilateral upper extremity supported Sitting balance-Leahy Scale: Poor Sitting balance - Comments: pt requiring bed or chair in front to hold onto; rail when transitioning for trunk support                                   ADL either performed or assessed with clinical judgement   ADL Overall ADL's : Needs assistance/impaired Eating/Feeding: Set up;Sitting   Grooming: Set up;Sitting;Bed level Grooming Details (indicate cue type and reason): sitting up at EOB, requiring minguardA with BUE supports holding onto chair in front to wash face. Upper Body Bathing: Minimal assistance;Sitting   Lower Body Bathing: Maximal assistance;Sitting/lateral leans;Bed level   Upper Body Dressing : Minimal assistance;Sitting   Lower Body Dressing: Maximal assistance;Sitting/lateral leans;Bed level   Toilet Transfer: Total assistance;+2 for physical assistance;+2 for safety/equipment Toilet Transfer Details (indicate cue type and reason): unable; use of bed pain and urinal Toileting- Clothing Manipulation and Hygiene: Total assistance;Cueing for safety;Bed level;Sitting/lateral lean Toileting - Clothing Manipulation Details (indicate cue type and reason): Pt requiring assist to hold urinal, but later able to position self in bed without difficulty.     Functional mobility during ADLs: Maximal assistance;+2 for physical assistance;+2 for safety/equipment (MaxA nearly totalA for supine to edge of stretcher; pt unable to stand from extended high height and b/l ankles very swollen) General ADL Comments: Pt limited by decreased strength, decreased mobility, decreased ability to care for  self and decreased activity tolerance. Pt's O2 >89-93% on RA with exertion.     Vision Baseline Vision/History: No visual deficits Patient Visual Report: No change from baseline Vision Assessment?: No apparent visual deficits     Perception      Praxis      Pertinent Vitals/Pain Pain Assessment: 0-10 Pain Score: 5  Pain Location: ankles Pain Descriptors / Indicators: Discomfort;Grimacing;Nagging;Sore Pain Intervention(s): Repositioned;Monitored during session     Hand Dominance Right   Extremity/Trunk Assessment Upper Extremity Assessment Upper Extremity Assessment: Generalized weakness   Lower Extremity Assessment Lower Extremity Assessment: Generalized weakness;RLE deficits/detail;LLE deficits/detail;Defer to PT evaluation RLE Deficits / Details: swelling at ankles with decreased ROM LLE Deficits / Details: swelling at ankles with decreased ROM   Cervical / Trunk Assessment Cervical / Trunk Assessment: Kyphotic   Communication Communication Communication: No difficulties   Cognition Arousal/Alertness: Awake/alert Behavior During Therapy: WFL for tasks assessed/performed Overall Cognitive Status: No family/caregiver present to determine baseline cognitive functioning                                 General Comments: Pt with no safety awareness and decreased ability to care for self and inability to get to EOB without assist, but pt still thinks that it is appropriate to go home.   General Comments  O2 >89% on RA; BP stable at EOB.    Exercises     Shoulder Instructions      Home Living Family/patient expects to be discharged to:: Private residence Living Arrangements: Alone Available Help at Discharge: Family;Available PRN/intermittently Type of Home: House Home Access: Level entry                     Home Equipment: Cane - single point          Prior Functioning/Environment Level of Independence: Independent with assistive device(s)        Comments: Pt reports a niece that helps some, but his 2 sisters are not in great health either. Uses SPC for mobility.        OT Problem List: Decreased strength;Decreased activity tolerance;Impaired balance (sitting and/or  standing);Pain;Decreased cognition;Decreased safety awareness;Cardiopulmonary status limiting activity;Impaired UE functional use;Increased edema      OT Treatment/Interventions: Self-care/ADL training;Therapeutic exercise;Energy conservation;DME and/or AE instruction;Therapeutic activities;Patient/family education;Balance training    OT Goals(Current goals can be found in the care plan section) Acute Rehab OT Goals Patient Stated Goal: to go home to pay my bills and call my sisters OT Goal Formulation: With patient Time For Goal Achievement: 06/29/20 Potential to Achieve Goals: Good ADL Goals Pt Will Perform Grooming: with modified independence;sitting Pt Will Perform Lower Body Dressing: with min assist;sitting/lateral leans;sit to/from stand Pt Will Transfer to Toilet: with mod assist;stand pivot transfer;bedside commode Pt Will Perform Toileting - Clothing Manipulation and hygiene: with max assist;sit to/from stand Pt/caregiver will Perform Home Exercise Program: Increased strength;Both right and left upper extremity Additional ADL Goal #1: Pt will perform x10 mins of OOB ADL utilizing energy conservation techniques to increase independence  OT Frequency: Min 2X/week   Barriers to D/C: Decreased caregiver support  only living sisters are no help to him as they are disabled.       Co-evaluation              AM-PAC OT "6 Clicks" Daily Activity     Outcome Measure Help from another person eating meals?: None Help from another  person taking care of personal grooming?: A Little Help from another person toileting, which includes using toliet, bedpan, or urinal?: A Lot Help from another person bathing (including washing, rinsing, drying)?: A Lot Help from another person to put on and taking off regular upper body clothing?: A Little Help from another person to put on and taking off regular lower body clothing?: A Lot 6 Click Score: 16   End of Session Equipment Utilized During  Treatment: Oxygen Nurse Communication: Mobility status;Other (comment) (need to call family out of town)  Activity Tolerance: Patient tolerated treatment well Patient left: in bed;with call bell/phone within reach  OT Visit Diagnosis: Unsteadiness on feet (R26.81);Muscle weakness (generalized) (M62.81);Pain;Other symptoms and signs involving cognitive function Pain - Right/Left: Right Pain - part of body: Ankle and joints of foot (B/L)                Time: 1006-1110 OT Time Calculation (min): 64 min Charges:  OT General Charges $OT Visit: 1 Visit OT Evaluation $OT Eval Moderate Complexity: 1 Mod OT Treatments $Self Care/Home Management : 23-37 mins $Therapeutic Activity: 8-22 mins  Flora Lipps, OTR/L Acute Rehabilitation Services Pager: [phone] Office: [phone]   Placido Hangartner C 06/15/2020, 12:17 PM

## 2020-06-15 NOTE — ED Notes (Signed)
CBC drawn and sent to lab

## 2020-06-15 NOTE — ED Notes (Addendum)
Message sent to pharmacy regarding Warfarin per order in [facility].

## 2020-06-15 NOTE — Progress Notes (Signed)
   06/15/20 2009  Assess: MEWS Score  Temp (!) 100.5 F (38.1 C)  BP (!) 143/47  Pulse Rate 80  Resp (!) 21  SpO2 95 %  O2 Device Room Air  Assess: MEWS Score  MEWS Temp 1  MEWS Systolic 0  MEWS Pulse 0  MEWS RR 1  MEWS LOC 0  MEWS Score 2  MEWS Score Color Yellow  Treat  Pain Scale 0-10  Pain Score 0  Patient with temperature of 100.53F during initial vitals assessment along with tachypnea with a a RR 21.  Patient received ice packs and tylenol for treatment of low-grade temperature and was placed on con't pulse ox with 2L of O2 [place] for alleviation of SOB.  Pt's vitals re-assessed with temperature improved after an hour 98.95F with patient still tachypneic, but SOB much improved and patient currently resting comfortably with no s/s of acute distress and O2 sats at 100%.  All other vitals remain stable and will continue to monitor.

## 2020-06-16 ENCOUNTER — Telehealth: Payer: Self-pay | Admitting: Family Medicine

## 2020-06-16 DIAGNOSIS — N179 Acute kidney failure, unspecified: Secondary | ICD-10-CM | POA: Diagnosis not present

## 2020-06-16 LAB — COMPREHENSIVE METABOLIC PANEL
ALT: 16 U/L (ref 0–44)
AST: 32 U/L (ref 15–41)
Albumin: 2.3 g/dL — ABNORMAL LOW (ref 3.5–5.0)
Alkaline Phosphatase: 39 U/L (ref 38–126)
Anion gap: 8 (ref 5–15)
BUN: 60 mg/dL — ABNORMAL HIGH (ref 8–23)
CO2: 22 mmol/L (ref 22–32)
Calcium: 9.2 mg/dL (ref 8.9–10.3)
Chloride: 98 mmol/L (ref 98–111)
Creatinine, Ser: 2.31 mg/dL — ABNORMAL HIGH (ref 0.61–1.24)
GFR, Estimated: 28 mL/min — ABNORMAL LOW (ref >60)
Glucose, Bld: 119 mg/dL — ABNORMAL HIGH (ref 70–99)
Potassium: 4.9 mmol/L (ref 3.5–5.1)
Sodium: 128 mmol/L — ABNORMAL LOW (ref 135–145)
Total Bilirubin: 1 mg/dL (ref 0.3–1.2)
Total Protein: 5.5 g/dL — ABNORMAL LOW (ref 6.5–8.1)

## 2020-06-16 LAB — CBC
HCT: 26.2 % — ABNORMAL LOW (ref 39.0–52.0)
Hemoglobin: 8.8 g/dL — ABNORMAL LOW (ref 13.0–17.0)
MCH: 28.9 pg (ref 26.0–34.0)
MCHC: 33.6 g/dL (ref 30.0–36.0)
MCV: 86.2 fL (ref 80.0–100.0)
Platelets: 267 10*3/uL (ref 150–400)
RBC: 3.04 MIL/uL — ABNORMAL LOW (ref 4.22–5.81)
RDW: 13.9 % (ref 11.5–15.5)
WBC: 12.2 10*3/uL — ABNORMAL HIGH (ref 4.0–10.5)
nRBC: 0 % (ref 0.0–0.2)

## 2020-06-16 LAB — CK: Total CK: 981 U/L — ABNORMAL HIGH (ref 49–397)

## 2020-06-16 LAB — PROTIME-INR
INR: 4 — ABNORMAL HIGH (ref 0.8–1.2)
Prothrombin Time: 37.9 seconds — ABNORMAL HIGH (ref 11.4–15.2)

## 2020-06-16 MED ORDER — DICLOFENAC SODIUM 1 % EX GEL
2.0000 g | CUTANEOUS | Status: DC | PRN
  Administered 2020-06-18 – 2020-06-22 (×3): 2 g via TOPICAL
  Filled 2020-06-16: qty 100

## 2020-06-16 MED ORDER — ENSURE ENLIVE PO LIQD
237.0000 mL | Freq: Two times a day (BID) | ORAL | Status: DC
  Administered 2020-06-16 – 2020-06-24 (×14): 237 mL via ORAL

## 2020-06-16 MED ORDER — ADULT MULTIVITAMIN W/MINERALS CH
1.0000 | ORAL_TABLET | Freq: Every day | ORAL | Status: DC
  Administered 2020-06-16 – 2020-06-24 (×9): 1 via ORAL
  Filled 2020-06-16 (×9): qty 1

## 2020-06-16 NOTE — Progress Notes (Signed)
Patient with order for CPAP unfulfilled overnight due to the unavailability of a negative air pressure room.  Hospitalist MD made aware of clinical situation and instructed to transfer patient to appropriate room for CPAP usage.  0448: Patient set to transfer to room 5N32, which has the appropriate accommodations  for the use of CPAP. Respiratory Therapist made aware of transfer and will arrive shortly to set up equipment for patient use.

## 2020-06-16 NOTE — TOC Initial Note (Signed)
Transition of Care [facility]) - Initial/Assessment Note    Patient Details  Name: Ruben [name] MRN: [id number] Date of Birth: 1940/11/06  [facility]) CM/SW Contact:    Ruben Goldmann, LCSW Phone Number: 06/16/2020, 2:56 PM  Clinical Narrative:  Talked with patient by phone regarding the recommendation of SNF for ST rehab. Ruben [name] indicated that he does not want SNF and wants to go home. When asked Ruben [name] responded that he has never been to SNF for rehab. Patient talked about his family and reported that:he has 2 sisters in [place], [place]; 1 sister in [place], [place], a nephew in [place], and a niece in [place], [place] and when asked responded that the niece (in [place], [place]) told him once that she would come and help him. He added that one of his nephews may also be able to help him out.  Patient advised that TOC would be following through discharge.                   Expected Discharge Plan: Home w Home Health Services Barriers to Discharge: Continued Medical Work up   Patient Goals and CMS Choice Patient states their goals for this hospitalization and ongoing recovery are:: Patient plans to return home at discharge CMS Medicare.gov Compare Post Acute Care list provided to:: Other (Comment Required) (Not provided as patient declined SNF) Choice offered to / list presented to : Patient (Talked with patient re: SNF, but he is declining placement at discharge)  Expected Discharge Plan and Services Expected Discharge Plan: Home w Home Health Services In-house Referral: Clinical Social Work     Living arrangements for the past 2 months:  (Patient lives alone - not sure what type house patient lives in)                                      Prior Living Arrangements/Services Living arrangements for the past 2 months:  (Patient lives alone - not sure what type house patient lives in) Lives with:: Self Patient language and need for interpreter  reviewed:: No Do you feel safe going back to the place where you live?: Yes      Need for Family Participation in Patient Care: Yes (Comment) Care giver support system in place?: No (comment)   Criminal Activity/Legal Involvement Pertinent to Current Situation/Hospitalization: No - Comment as needed  Activities of Daily Living      Permission Sought/Granted Permission sought to share information with : Other (comment) (Permission not granted to speak with family) Permission granted to share information with : No              Emotional Assessment Appearance:: Other (Comment Required (Talked with patient by phone) Attitude/Demeanor/Rapport: Engaged Affect (typically observed): Appropriate Orientation: : Oriented to Self,Oriented to Place,Oriented to Situation Alcohol / Substance Use: Not Applicable Psych Involvement: No (comment)  Admission diagnosis:  Dehydration [E86.0] Elevated CK [R74.8] History of atrial fibrillation [Z86.79] Generalized weakness [R53.1] AKI (acute kidney injury) (HCC) [N17.9] Acute kidney injury (HCC) [N17.9] Frequent falls [R29.6] Warfarin anticoagulation [Z79.01] Patient Active Problem List   Diagnosis Date Noted  . Acute kidney injury (HCC) 06/15/2020  . Frequent falls 06/14/2020  . Bilateral knee pain 05/06/2019  . Pain in joint of left elbow 05/06/2019  . Pain of left hip joint 05/01/2019  . Pressure injury of skin 08/14/2016  . Acute respiratory failure with hypoxemia (HCC)  08/13/2016  . AKI (acute kidney injury) (HCC) 08/13/2016  . Chronic diastolic heart failure (HCC)   . Edema leg 02/11/2015  . Chronic renal insufficiency, stage III (moderate) (HCC) 11/04/2014  . Obesity 07/22/2014  . ICD (implantable cardioverter-defibrillator) in place 07/22/2014  . Skin lesion of right leg 06/03/2014  . Bronchitis, chronic obstructive, with exacerbation (HCC) 01/04/2014  . Complete heart block  s/p AV ablation 03/26/2013  . Cardiac arrest (HCC)  03/26/2013  . Sleep apnea 03/26/2013  . Permanent atrial fibrillation (HCC) 09/06/2012  . Long term current use of anticoagulant therapy 09/06/2012  . Hyperlipidemia 02/04/2009  . Essential hypertension 02/04/2009   PCP:  [name], MD Pharmacy:   [facility] - [place], [place] - [address] [place] [address] Phone: [phone] Fax: [phone]  [id number] - [place], [place] - [address] AT [facility] [address]  [place] [address] Phone: [phone] Fax: [phone]     Social Determinants of Health (SDOH) Interventions    Readmission Risk Interventions No flowsheet data found.

## 2020-06-16 NOTE — Telephone Encounter (Signed)
Spoke with Vernona Rieger, who stated overall concerns for pt wellbeing at home. EMS has been to his house 3 times within a 24 hour period for falls. Pt has stated at times he was on the floor all night. He has a cane with 4 points but with the floors in his trailer being so uneven it causes the cane to be wobbly. EMS states she is willing to help pt at home if [facility] do not admit him in a SNF since he do live alone. I asked paramedic about the encounter on the 4th. She stated that the EMS noted altered mental.   FYI
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
## 2020-06-16 NOTE — Progress Notes (Signed)
Pt refused to go on CPAP for the night. Pt stated he does not want to wear it. RN made aware.
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
## 2020-06-16 NOTE — Telephone Encounter (Signed)
Blondell Reveal, community paramedic with [facility], requests a call back regarding this patient, his 911 activity, calls for assistance and his overall situation at home. Please call Vernona Rieger at [phone]
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
## 2020-06-16 NOTE — Progress Notes (Signed)
Placed patient on CPAP via auto-mode with oxygen set at 2lpm.

## 2020-06-16 NOTE — Consult Note (Signed)
WOC Nurse Consult Note: Patient receiving care in [facility] [phone] Consult completed remotely after review of chart and [place] with bedside RN Junious Dresser.  Reason for Consult: Sacral wounds Wound type: Coccyx wound, dark purple with skin tear which has scant drainage. Right buttock wound pink/red which is draining moderate amount serosanguinous drainage.  Pressure Injury POA: Yes Measurement: See flow sheet. Bedside RN to document Dressing procedure/placement/frequency: Cleanse the coccyx and right buttock wound with soap and water, rinse and pat dry. Apply Xeroform Gauze [place] # 294) to the open areas and cover with foam dressing. Change daily.  Monitor the wound area(s) for worsening of condition such as: Signs/symptoms of infection, increase in size, development of or worsening of odor, development of pain, or increased pain at the affected locations.   Notify the medical team if any of these develop.  Thank you for the consult. WOC nurse will not follow at this time.   Please re-consult the WOC team if needed.  Renaldo Reel Katrinka Blazing, MSN, RN, CMSRN, Angus Seller, [facility] Wound Treatment Associate Pager [phone]

## 2020-06-17 ENCOUNTER — Inpatient Hospital Stay (HOSPITAL_COMMUNITY): Payer: Medicare Other

## 2020-06-17 DIAGNOSIS — N179 Acute kidney failure, unspecified: Secondary | ICD-10-CM | POA: Diagnosis not present

## 2020-06-17 LAB — CBC
HCT: 27 % — ABNORMAL LOW (ref 39.0–52.0)
Hemoglobin: 9.1 g/dL — ABNORMAL LOW (ref 13.0–17.0)
MCH: 29.1 pg (ref 26.0–34.0)
MCHC: 33.7 g/dL (ref 30.0–36.0)
MCV: 86.3 fL (ref 80.0–100.0)
Platelets: 272 10*3/uL (ref 150–400)
RBC: 3.13 MIL/uL — ABNORMAL LOW (ref 4.22–5.81)
RDW: 13.9 % (ref 11.5–15.5)
WBC: 12.8 10*3/uL — ABNORMAL HIGH (ref 4.0–10.5)
nRBC: 0 % (ref 0.0–0.2)

## 2020-06-17 LAB — COMPREHENSIVE METABOLIC PANEL
ALT: 19 U/L (ref 0–44)
AST: 29 U/L (ref 15–41)
Albumin: 2.3 g/dL — ABNORMAL LOW (ref 3.5–5.0)
Alkaline Phosphatase: 42 U/L (ref 38–126)
Anion gap: 8 (ref 5–15)
BUN: 65 mg/dL — ABNORMAL HIGH (ref 8–23)
CO2: 22 mmol/L (ref 22–32)
Calcium: 9.3 mg/dL (ref 8.9–10.3)
Chloride: 95 mmol/L — ABNORMAL LOW (ref 98–111)
Creatinine, Ser: 2.28 mg/dL — ABNORMAL HIGH (ref 0.61–1.24)
GFR, Estimated: 29 mL/min — ABNORMAL LOW (ref >60)
Glucose, Bld: 100 mg/dL — ABNORMAL HIGH (ref 70–99)
Potassium: 5.5 mmol/L — ABNORMAL HIGH (ref 3.5–5.1)
Sodium: 125 mmol/L — ABNORMAL LOW (ref 135–145)
Total Bilirubin: 1.1 mg/dL (ref 0.3–1.2)
Total Protein: 5.7 g/dL — ABNORMAL LOW (ref 6.5–8.1)

## 2020-06-17 LAB — CK: Total CK: 598 U/L — ABNORMAL HIGH (ref 49–397)

## 2020-06-17 LAB — PROTIME-INR
INR: 3.1 — ABNORMAL HIGH (ref 0.8–1.2)
Prothrombin Time: 31.2 seconds — ABNORMAL HIGH (ref 11.4–15.2)

## 2020-06-17 MED ORDER — WARFARIN SODIUM 2.5 MG PO TABS
2.5000 mg | ORAL_TABLET | Freq: Once | ORAL | Status: AC
  Administered 2020-06-17: 2.5 mg via ORAL
  Filled 2020-06-17: qty 1

## 2020-06-17 NOTE — Progress Notes (Signed)
Physical Therapy Treatment Patient Details Name: Ruben Walker MRN: [id number] DOB: December 07, 1940 Today's Date: 06/17/2020    History of Present Illness Ruben Walker is a 80 y.o. male with PMHx: of OSA on CPAP; OHS; pacemaker/AICD dependence with h/o cardiac arrest; HTN; HLD; stage 3b CKD; chronic diastolic CHF; and afib on Coumadin presenting with frequent falls, AKI, and COVID-19.    PT Comments    Patient requires heavy assistance for bed mobility this session. Patient with poor sitting balance requiring minA initially, progressing to min guard with B UE support. Performed fwd reaching outside BOS with min guard for safety. Patient presents with generalized weakness, impaired balance, decreased activity tolerance, and impaired functional mobility. Continue to recommend SNF for ongoing Physical Therapy as patient has unsafe d/c plan as he lives alone.       Follow Up Recommendations  SNF;Supervision/Assistance - 24 hour     Equipment Recommendations  Other (comment) (defer to post acute rehab)    Recommendations for Other Services       Precautions / Restrictions Precautions Precautions: Fall Precaution Comments: Covid+ Restrictions Weight Bearing Restrictions: No    Mobility  Bed Mobility Overal bed mobility: Needs Assistance Bed Mobility: Supine to Sit;Sit to Supine     Supine to sit: Max assist;+2 for physical assistance;+2 for safety/equipment;HOB elevated Sit to supine: Total assist;+2 for physical assistance;+2 for safety/equipment   General bed mobility comments: Cues for sequencing and use of bedrails to assist, noted SOB and needed rest break  Transfers                 General transfer comment: deferred due to increased pain noted by patient  Ambulation/Gait                 Stairs             Wheelchair Mobility    Modified Rankin (Stroke Patients Only)       Balance Overall balance assessment: Needs  assistance Sitting-balance support: Feet supported;Bilateral upper extremity supported Sitting balance-Leahy Scale: Poor Sitting balance - Comments: required minA to maintain sitting balance initially with B UE support, progressed to min guard Postural control: Posterior lean;Right lateral lean                                  Cognition Arousal/Alertness: Awake/alert Behavior During Therapy: Flat affect Overall Cognitive Status: No family/caregiver present to determine baseline cognitive functioning                                 General Comments: decreased awareness of deficits at times as patient wants to return home, however states he needs a lot of assistance to stand      Exercises General Exercises - Lower Extremity Quad Sets: Both;5 reps;Supine Long Arc Quad: Both;5 reps;Seated Hip ABduction/ADduction: AAROM;Both;Supine Hip Flexion/Marching: Both;5 reps Other Exercises Other Exercises: Seated fwd reaching outside BOS with B UEs. min guard for safety    General Comments General comments (skin integrity, edema, etc.): VSS stable      Pertinent Vitals/Pain Pain Assessment: Faces Faces Pain Scale: Hurts little more Pain Location: B LEs with movement Pain Descriptors / Indicators: Grimacing;Guarding Pain Intervention(s): Monitored during session;Repositioned    Home Living                      Prior  Function            PT Goals (current goals can now be found in the care plan section) Acute Rehab PT Goals Patient Stated Goal: to go home PT Goal Formulation: With patient Time For Goal Achievement: 06/29/20 Potential to Achieve Goals: Fair Progress towards PT goals: Progressing toward goals    Frequency    Min 2X/week      PT Plan Current plan remains appropriate    Co-evaluation              AM-PAC PT "6 Clicks" Mobility   Outcome Measure  Help needed turning from your back to your side while in a flat bed  without using bedrails?: A Lot Help needed moving from lying on your back to sitting on the side of a flat bed without using bedrails?: A Lot Help needed moving to and from a bed to a chair (including a wheelchair)?: A Lot Help needed standing up from a chair using your arms (e.g., wheelchair or bedside chair)?: A Lot Help needed to walk in hospital room?: A Lot Help needed climbing 3-5 steps with a railing? : Total 6 Click Score: 11    End of Session   Activity Tolerance: Patient limited by pain Patient left: in bed;with call bell/phone within reach;with bed alarm set Nurse Communication: Mobility status PT Visit Diagnosis: Unsteadiness on feet (R26.81);Muscle weakness (generalized) (M62.81);Difficulty in walking, not elsewhere classified (R26.2)     Time: 4782-9562 PT Time Calculation (min) (ACUTE ONLY): 38 min  Charges:  $Therapeutic Activity: 23-37 mins                     Aalyssa Elderkin A. Dan Humphreys PT, DPT Acute Rehabilitation Services Pager [phone] Office [phone]    Elissa Lovett 06/17/2020, 12:28 PM

## 2020-06-17 NOTE — Progress Notes (Signed)
Charge RN, Dr. Natale Milch, and RRN notified about pt's current respiratory status. Pt called out complaining of SOB, oxygen applied.

## 2020-06-17 NOTE — Progress Notes (Signed)
RRN assesed pt while on floor. Pt now back on RA with O2 of 95%. Dr. Natale Milch notified. Will continue to monitor.

## 2020-06-17 NOTE — Plan of Care (Signed)
  Problem: Safety: Goal: Ability to remain free from injury will improve Outcome: Progressing   

## 2020-06-17 NOTE — Plan of Care (Signed)

## 2020-06-17 NOTE — Significant Event (Signed)
Rapid Response Event Note   Reason for Call :  O2 desaturation  Initial Focused Assessment:  Patient sitting in bed no distress on NRB with 2L flow. Lung sounds with scattered crackles Skin warm and pink He is alert and Oriented speaking in complete sentences  BP 111/47  HR 70  RR 24  O2 sat 97%   Interventions:  O2 removed.  O2 sat 98% on RA  Repositioned patient: When he was flat he became wheezy, mostly upper airway.  Resolved when HOB elevated.  He states he normally sleeps on 4 pillows.   He states he does not normally were cpap at night  PCXR done  Plan of Care:     Event Summary:   MD Notified: Natale Milch Call Time:  1427 Arrival Time: 1427 End Time: 1500  Marcellina Millin, RN

## 2020-06-17 NOTE — Progress Notes (Signed)
ANTICOAGULATION CONSULT NOTE - Follow-Up Consult  Pharmacy Consult for warfarin Indication: atrial fibrillation  No Known Allergies  Patient Measurements:    Vital Signs: Temp: 98.1 F (36.7 C) (01/28 0400) Temp Source: Oral (01/28 0400) BP: 118/59 (01/28 0400) Pulse Rate: 70 (01/28 0400)  Labs: Recent Labs    06/14/20 1046 06/14/20 1215 06/15/20 0612 06/15/20 1329 06/16/20 0142 06/17/20 0510  HGB 11.1*  --   --  9.6* 8.8* 9.1*  HCT 35.3*  --   --  28.7* 26.2* 27.0*  PLT 326  --   --  217 267 272  LABPROT  --    < > 45.1*  --  37.9* 31.2*  INR  --    < > 5.0*  --  4.0* 3.1*  CREATININE 2.86*  --  2.29*  --  2.31* 2.28*  CKTOTAL 2,236*  --   --   --  981* 598*   < > = values in this interval not displayed.    CrCl cannot be calculated (Unknown ideal weight.).  Assessment: 80 year old male presented to ED due to frequent falls. PMH includes pacemaker/ICD with hx of cardiac arrest, HTN, HLD, CKD, HF, afib on warfarin.  INR today remains SUPRAtherapeutic at 3.1 but trending down nicely. Hgb stable at 9.1, plts wnl. Will resume dosing today to attempt to prevent another large drop.  No acute injuries including negative head CT from most recent fall. Denied abnormal bruising or bleeding. Reported nosebleeds on warfarin but patient not interested in DOAC.  Reported poor PO intake.  PTA warfarin 5 mg MWF and 2.5 mg all other days. Last dose 1/24 at 1800.   Warfarin managed by Jamison Neighbor, last visit on 04/29/20 INR was supratherapeutic at 3.4 on current regimen.  Goal of Therapy:  INR 2-3 Monitor platelets by anticoagulation protocol: Yes   Plan:  - Warfarin 2.5 mg x 1 dose at 1800 today - Daily PT/INR, CBC q72h - Will continue to monitor for any signs/symptoms of bleeding and will follow up with PT/INR in the a.m.    Thank you for allowing pharmacy to be a part of this patient's care.  Georgina Pillion, PharmD, BCPS Clinical Pharmacist Clinical phone for  06/17/2020: [phone] 06/17/2020 8:41 AM   **Pharmacist phone directory can now be found on [email or web address] ([email or web address]).  Listed under [facility].
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
## 2020-06-18 DIAGNOSIS — N179 Acute kidney failure, unspecified: Secondary | ICD-10-CM | POA: Diagnosis not present

## 2020-06-18 LAB — COMPREHENSIVE METABOLIC PANEL
ALT: 23 U/L (ref 0–44)
AST: 38 U/L (ref 15–41)
Albumin: 2.3 g/dL — ABNORMAL LOW (ref 3.5–5.0)
Alkaline Phosphatase: 57 U/L (ref 38–126)
Anion gap: 9 (ref 5–15)
BUN: 66 mg/dL — ABNORMAL HIGH (ref 8–23)
CO2: 22 mmol/L (ref 22–32)
Calcium: 9.7 mg/dL (ref 8.9–10.3)
Chloride: 97 mmol/L — ABNORMAL LOW (ref 98–111)
Creatinine, Ser: 1.89 mg/dL — ABNORMAL HIGH (ref 0.61–1.24)
GFR, Estimated: 36 mL/min — ABNORMAL LOW (ref >60)
Glucose, Bld: 97 mg/dL (ref 70–99)
Potassium: 6.1 mmol/L — ABNORMAL HIGH (ref 3.5–5.1)
Sodium: 128 mmol/L — ABNORMAL LOW (ref 135–145)
Total Bilirubin: 0.9 mg/dL (ref 0.3–1.2)
Total Protein: 6.1 g/dL — ABNORMAL LOW (ref 6.5–8.1)

## 2020-06-18 LAB — CBC
HCT: 29.2 % — ABNORMAL LOW (ref 39.0–52.0)
Hemoglobin: 9.3 g/dL — ABNORMAL LOW (ref 13.0–17.0)
MCH: 28.3 pg (ref 26.0–34.0)
MCHC: 31.8 g/dL (ref 30.0–36.0)
MCV: 88.8 fL (ref 80.0–100.0)
Platelets: 321 10*3/uL (ref 150–400)
RBC: 3.29 MIL/uL — ABNORMAL LOW (ref 4.22–5.81)
RDW: 13.7 % (ref 11.5–15.5)
WBC: 15.1 10*3/uL — ABNORMAL HIGH (ref 4.0–10.5)
nRBC: 0 % (ref 0.0–0.2)

## 2020-06-18 LAB — PROTIME-INR
INR: 2.2 — ABNORMAL HIGH (ref 0.8–1.2)
Prothrombin Time: 23.8 seconds — ABNORMAL HIGH (ref 11.4–15.2)

## 2020-06-18 LAB — CK: Total CK: 545 U/L — ABNORMAL HIGH (ref 49–397)

## 2020-06-18 MED ORDER — WARFARIN SODIUM 2.5 MG PO TABS
2.5000 mg | ORAL_TABLET | Freq: Once | ORAL | Status: AC
  Administered 2020-06-18: 2.5 mg via ORAL
  Filled 2020-06-18: qty 1

## 2020-06-18 MED ORDER — TORSEMIDE 20 MG PO TABS
20.0000 mg | ORAL_TABLET | Freq: Every day | ORAL | Status: DC
  Administered 2020-06-18 – 2020-06-20 (×3): 20 mg via ORAL
  Filled 2020-06-18 (×3): qty 1

## 2020-06-18 MED ORDER — SODIUM ZIRCONIUM CYCLOSILICATE 5 G PO PACK
5.0000 g | PACK | Freq: Once | ORAL | Status: AC
  Administered 2020-06-18: 5 g via ORAL
  Filled 2020-06-18: qty 1

## 2020-06-18 NOTE — Progress Notes (Addendum)
Respiratory just came in to place patient on CPAP.  She stated that patient is sating 96 % but he is having some labored breathing.  Will have Rapid Response to come and see patient.  Patient stated that he is fine and he is refusing CPAP.  Tried to encourage him to keep it on.  His breathing does appear labored but his abdomin is distended.

## 2020-06-18 NOTE — Progress Notes (Signed)
ANTICOAGULATION CONSULT NOTE - Follow-Up Consult  Pharmacy Consult for warfarin Indication: atrial fibrillation  No Known Allergies  Patient Measurements:    Vital Signs: Temp: 98 F (36.7 C) (01/29 0300) Temp Source: Oral (01/29 0300) BP: 117/62 (01/29 0300) Pulse Rate: 66 (01/29 0300)  Labs: Recent Labs    06/16/20 0142 06/17/20 0510 06/18/20 0332  HGB 8.8* 9.1* 9.3*  HCT 26.2* 27.0* 29.2*  PLT 267 272 321  LABPROT 37.9* 31.2* 23.8*  INR 4.0* 3.1* 2.2*  CREATININE 2.31* 2.28* 1.89*  CKTOTAL 981* 598* 545*    CrCl cannot be calculated (Unknown ideal weight.).  Assessment: 80 year old male presented to ED due to frequent falls. PMH includes pacemaker/ICD with hx of cardiac arrest, HTN, HLD, CKD, HF, afib on warfarin.  INR today is therapeutic at 2.2 and has trended down from 5 since 1/26. Hgb stable ~9, plts wnl. Resumed dosing yesterday in order to prevent further significant INR drop; will continue warfarin dosing today.   No acute injuries including negative head CT from most recent fall. Denied abnormal bruising or bleeding. Reported nosebleeds on warfarin but patient not interested in DOAC.  Reported poor PO intake.  PTA warfarin 5 mg MWF and 2.5 mg all other days. Last dose 1/24 at 1800.   Warfarin managed by Jamison Neighbor, last visit on 04/29/20 INR was supratherapeutic at 3.4 on current regimen.  Goal of Therapy:  INR 2-3 Monitor platelets by anticoagulation protocol: Yes   Plan:  - Warfarin 2.5 mg x 1 dose at 1800 today - Daily PT/INR, CBC q72h - Will continue to monitor for any signs/symptoms of bleeding and will follow up with PT/INR in the a.m.    Thank you for allowing pharmacy to be a part of this patient's care.  Margarite Gouge, PharmD PGY2 ID Pharmacy Resident Phone between 7 am - 3:30 pm: [phone]  Please check AMION for all [facility] Pharm[facility] phone numbers After 10:00 PM, call Main Pharmacy [phone]
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
## 2020-06-18 NOTE — Plan of Care (Signed)
  Problem: Pain Managment: Goal: General experience of comfort will improve Outcome: Progressing   Problem: Education: Goal: Knowledge of General Education information will improve Description: Including pain rating scale, medication(s)/side effects and non-pharmacologic comfort measures Outcome: Progressing   

## 2020-06-19 DIAGNOSIS — N179 Acute kidney failure, unspecified: Secondary | ICD-10-CM | POA: Diagnosis not present

## 2020-06-19 LAB — CBC
HCT: 27.3 % — ABNORMAL LOW (ref 39.0–52.0)
Hemoglobin: 8.7 g/dL — ABNORMAL LOW (ref 13.0–17.0)
MCH: 28.1 pg (ref 26.0–34.0)
MCHC: 31.9 g/dL (ref 30.0–36.0)
MCV: 88.1 fL (ref 80.0–100.0)
Platelets: 347 10*3/uL (ref 150–400)
RBC: 3.1 MIL/uL — ABNORMAL LOW (ref 4.22–5.81)
RDW: 14.1 % (ref 11.5–15.5)
WBC: 13.8 10*3/uL — ABNORMAL HIGH (ref 4.0–10.5)
nRBC: 0 % (ref 0.0–0.2)

## 2020-06-19 LAB — COMPREHENSIVE METABOLIC PANEL
ALT: 21 U/L (ref 0–44)
AST: 28 U/L (ref 15–41)
Albumin: 2.1 g/dL — ABNORMAL LOW (ref 3.5–5.0)
Alkaline Phosphatase: 66 U/L (ref 38–126)
Anion gap: 10 (ref 5–15)
BUN: 77 mg/dL — ABNORMAL HIGH (ref 8–23)
CO2: 21 mmol/L — ABNORMAL LOW (ref 22–32)
Calcium: 9.5 mg/dL (ref 8.9–10.3)
Chloride: 99 mmol/L (ref 98–111)
Creatinine, Ser: 2.32 mg/dL — ABNORMAL HIGH (ref 0.61–1.24)
GFR, Estimated: 28 mL/min — ABNORMAL LOW (ref >60)
Glucose, Bld: 103 mg/dL — ABNORMAL HIGH (ref 70–99)
Potassium: 6.1 mmol/L — ABNORMAL HIGH (ref 3.5–5.1)
Sodium: 130 mmol/L — ABNORMAL LOW (ref 135–145)
Total Bilirubin: 0.7 mg/dL (ref 0.3–1.2)
Total Protein: 6 g/dL — ABNORMAL LOW (ref 6.5–8.1)

## 2020-06-19 LAB — PROTIME-INR
INR: 1.8 — ABNORMAL HIGH (ref 0.8–1.2)
Prothrombin Time: 19.9 seconds — ABNORMAL HIGH (ref 11.4–15.2)

## 2020-06-19 LAB — CK: Total CK: 307 U/L (ref 49–397)

## 2020-06-19 MED ORDER — CHLORHEXIDINE GLUCONATE CLOTH 2 % EX PADS
6.0000 | MEDICATED_PAD | Freq: Every day | CUTANEOUS | Status: DC
  Administered 2020-06-19 – 2020-06-24 (×6): 6 via TOPICAL

## 2020-06-19 MED ORDER — WARFARIN SODIUM 2.5 MG PO TABS
2.5000 mg | ORAL_TABLET | Freq: Once | ORAL | Status: AC
  Administered 2020-06-19: 2.5 mg via ORAL
  Filled 2020-06-19 (×2): qty 1

## 2020-06-19 NOTE — Progress Notes (Signed)
Rapid Response came to see patient.  There are no immediate concerns at this time.  Will continue to monitor.
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
## 2020-06-19 NOTE — Progress Notes (Signed)
ANTICOAGULATION CONSULT NOTE - Follow-Up Consult  Pharmacy Consult for warfarin Indication: atrial fibrillation  No Known Allergies  Patient Measurements:    Vital Signs: Temp: 98.6 F (37 C) (01/30 0533) Temp Source: Oral (01/30 0533) BP: 97/48 (01/30 0533) Pulse Rate: 73 (01/30 0533)  Labs: Recent Labs    06/17/20 0510 06/18/20 0332 06/19/20 0217  HGB 9.1* 9.3* 8.7*  HCT 27.0* 29.2* 27.3*  PLT 272 321 347  LABPROT 31.2* 23.8* 19.9*  INR 3.1* 2.2* 1.8*  CREATININE 2.28* 1.89* 2.32*  CKTOTAL 598* 545* 307    CrCl cannot be calculated (Unknown ideal weight.).  Assessment: 80 year old male presented to ED due to frequent falls. PMH includes pacemaker/ICD with hx of cardiac arrest, HTN, HLD, CKD, HF, afib on warfarin.  INR today slightly SUBtherapeutic at 1.8 and has trended down from 5 since 1/26. Hgb stable ~9, plts wnl. Resumed dosing 1/28 in order to prevent further significant INR drop; will continue warfarin 2.5mg  dosing today. No acute injuries including negative head CT from most recent fall. Denied abnormal bruising or bleeding. Reported nosebleeds on warfarin but patient not interested in DOAC.  Reported poor PO intake.  PTA warfarin 5 mg MWF and 2.5 mg all other days. Last dose 1/24 at 1800. Warfarin managed by Jamison Neighbor, last visit on 04/29/20 INR was supratherapeutic at 3.4 on current regimen.  Goal of Therapy:  INR 2-3 Monitor platelets by anticoagulation protocol: Yes   Plan:  - Warfarin 2.5 mg x 1 dose at 1800 today - Daily PT/INR, CBC q72h - Will continue to monitor for any signs/symptoms of bleeding and will follow up with PT/INR in the a.m.    Thank you for allowing pharmacy to be a part of this patient's care.  Margarite Gouge, PharmD PGY2 ID Pharmacy Resident Phone between 7 am - 3:30 pm: [phone]  Please check AMION for all [facility] phone numbers After 10:00 PM, call Main Pharmacy [phone]

## 2020-06-20 DIAGNOSIS — N179 Acute kidney failure, unspecified: Secondary | ICD-10-CM | POA: Diagnosis not present

## 2020-06-20 LAB — COMPREHENSIVE METABOLIC PANEL
ALT: 20 U/L (ref 0–44)
AST: 22 U/L (ref 15–41)
Albumin: 2 g/dL — ABNORMAL LOW (ref 3.5–5.0)
Alkaline Phosphatase: 60 U/L (ref 38–126)
Anion gap: 9 (ref 5–15)
BUN: 77 mg/dL — ABNORMAL HIGH (ref 8–23)
CO2: 22 mmol/L (ref 22–32)
Calcium: 9.3 mg/dL (ref 8.9–10.3)
Chloride: 96 mmol/L — ABNORMAL LOW (ref 98–111)
Creatinine, Ser: 1.87 mg/dL — ABNORMAL HIGH (ref 0.61–1.24)
GFR, Estimated: 36 mL/min — ABNORMAL LOW (ref >60)
Glucose, Bld: 92 mg/dL (ref 70–99)
Potassium: 5.6 mmol/L — ABNORMAL HIGH (ref 3.5–5.1)
Sodium: 127 mmol/L — ABNORMAL LOW (ref 135–145)
Total Bilirubin: 0.8 mg/dL (ref 0.3–1.2)
Total Protein: 5.9 g/dL — ABNORMAL LOW (ref 6.5–8.1)

## 2020-06-20 LAB — CBC
HCT: 26.5 % — ABNORMAL LOW (ref 39.0–52.0)
Hemoglobin: 8.4 g/dL — ABNORMAL LOW (ref 13.0–17.0)
MCH: 28 pg (ref 26.0–34.0)
MCHC: 31.7 g/dL (ref 30.0–36.0)
MCV: 88.3 fL (ref 80.0–100.0)
Platelets: 354 10*3/uL (ref 150–400)
RBC: 3 MIL/uL — ABNORMAL LOW (ref 4.22–5.81)
RDW: 13.9 % (ref 11.5–15.5)
WBC: 12.6 10*3/uL — ABNORMAL HIGH (ref 4.0–10.5)
nRBC: 0 % (ref 0.0–0.2)

## 2020-06-20 LAB — CK: Total CK: 142 U/L (ref 49–397)

## 2020-06-20 LAB — PROTIME-INR
INR: 2 — ABNORMAL HIGH (ref 0.8–1.2)
Prothrombin Time: 22 seconds — ABNORMAL HIGH (ref 11.4–15.2)

## 2020-06-20 MED ORDER — WARFARIN SODIUM 2.5 MG PO TABS
2.5000 mg | ORAL_TABLET | Freq: Once | ORAL | Status: AC
  Administered 2020-06-20: 2.5 mg via ORAL
  Filled 2020-06-20: qty 1

## 2020-06-20 MED ORDER — FUROSEMIDE 10 MG/ML IJ SOLN
40.0000 mg | Freq: Two times a day (BID) | INTRAMUSCULAR | Status: DC
  Administered 2020-06-20 – 2020-06-24 (×9): 40 mg via INTRAVENOUS
  Filled 2020-06-20 (×9): qty 4

## 2020-06-20 NOTE — Progress Notes (Signed)
ANTICOAGULATION CONSULT NOTE - Follow-Up Consult  Pharmacy Consult for warfarin Indication: atrial fibrillation  No Known Allergies  Patient Measurements:    Vital Signs: Temp: 98.2 F (36.8 C) (01/31 0419) Temp Source: Oral (01/31 0419) BP: 115/49 (01/31 0419) Pulse Rate: 70 (01/31 0419)  Labs: Recent Labs    06/18/20 0332 06/19/20 0217 06/20/20 0748  HGB 9.3* 8.7* 8.4*  HCT 29.2* 27.3* 26.5*  PLT 321 347 354  LABPROT 23.8* 19.9* 22.0*  INR 2.2* 1.8* 2.0*  CREATININE 1.89* 2.32* 1.87*  CKTOTAL 545* 307 142    CrCl cannot be calculated (Unknown ideal weight.).  Assessment: 80 year old male presented to ED due to frequent falls. PMH includes pacemaker/ICD with hx of cardiac arrest, HTN, HLD, CKD, HF, afib on warfarin.  INR supratherapeutic on admission. Today, INR 2.0 is therapeutic. Hgb 8.4. Plt wnl. No reported bleeding.  Reported nosebleeds on warfarin but patient not interested in DOAC.  PTA warfarin 5 mg MWF and 2.5 mg all other days. Last dose 1/24 at 1800. Warfarin managed by Jamison Neighbor, last visit on 04/29/20 INR was supratherapeutic at 3.4 on current regimen.  Goal of Therapy:  INR 2-3 Monitor platelets by anticoagulation protocol: Yes   Plan:  - Warfarin 2.5 mg x 1 dose today - Daily PT/INR, CBC q72h - Will continue to monitor for any signs/symptoms of bleeding and will follow up with PT/INR in the a.m.    Thank you for allowing pharmacy to be a part of this patient's care.  Gerrit Halls, PharmD Clinical Pharmacist

## 2020-06-20 NOTE — Progress Notes (Signed)
Per RN, patient refused CPAP for HS. Aware to call if patient changes his mind and wants to wear. Equipment at bedside.

## 2020-06-20 NOTE — Care Management Important Message (Signed)
Important Message  Patient Details  Name: Ruben Walker MRN: [id number] Date of Birth: 11/28/1940   Medicare Important Message Given:  Yes - Important Message mailed due to current National Emergency   Verbal consent obtained due to current National Emergency  Relationship to patient: Self Contact Name: Beacher Every Call Date: 06/20/20  Time: 1420 Phone: [phone] Outcome: Spoke with contact Important Message mailed to: Other (must enter comment) (spoke with [name], he requested a copy of IM. Will give to unit secretary to have delivered to patient)    Orson Aloe 06/20/2020, 2:24 PM

## 2020-06-20 NOTE — Progress Notes (Signed)
Occupational Therapy Treatment Patient Details Name: Ruben Walker MRN: [id number] DOB: July 11, 1940 Today's Date: 06/20/2020    History of present illness Ruben Walker is a 80 y.o. male with PMHx: of OSA on CPAP; OHS; pacemaker/AICD dependence with h/o cardiac arrest; HTN; HLD; stage 3b CKD; chronic diastolic CHF; and afib on Coumadin presenting with frequent falls, AKI, and COVID-19.   OT comments  Pt agreeable to OOB transfer to recliner with Maximove. Pt required mod A +2 for rolling in bed to place sling for Maximove transfer.RN assisted OT rolling pt in bed to  place maximove sling in prep for transfer to recliner. Once seated in recliner pt participated in grooming and UB selfcatre tasks. OT will continue to follow acutely to maximize level of function and safety  Follow Up Recommendations  SNF    Equipment Recommendations  3 in 1 bedside commode;Wheelchair (measurements OT);Wheelchair cushion (measurements OT);Other (comment) (TBD at SNF)    Recommendations for Other Services      Precautions / Restrictions Precautions Precautions: Fall Precaution Comments: Covid+ Restrictions Weight Bearing Restrictions: No       Mobility Bed Mobility Overal bed mobility: Needs Assistance   Rolling: Mod assist;+2 for physical assistance         General bed mobility comments: RN assisted OT witl rolling pt in bed to  place maximozve sling in prep for transfer to recliner  Transfers Overall transfer level: Needs assistance               General transfer comment: pt transferred to recliner    Balance Overall balance assessment: Needs assistance Sitting-balance support: Feet supported;Bilateral upper extremity supported Sitting balance-Leahy Scale: Poor Sitting balance - Comments: pt able to maintain sitting balance supported in recliner                                   ADL either performed or assessed with clinical judgement   ADL Overall ADL's :  Needs assistance/impaired     Grooming: Wash/dry hands;Wash/dry face;Supervision/safety;Set up;Sitting   Upper Body Bathing: Minimal assistance;Sitting Upper Body Bathing Details (indicate cue type and reason): seated in recliner     Upper Body Dressing : Minimal assistance;Sitting Upper Body Dressing Details (indicate cue type and reason): donned clean gown seated in recliner     Toilet Transfer: Total assistance;+2 for physical assistance;+2 for safety/equipment (Maxiove transfer to recliner) Toilet Transfer Details (indicate cue type and reason): unable; use of bed pain and urinal Toileting- Clothing Manipulation and Hygiene: Total assistance;Cueing for safety;Bed level       Functional mobility during ADLs: Total assistance (Maximove) General ADL Comments: Pt limited by decreased strength, decreased mobility, decreased ability to care for self and decreased activity tolerance     Vision Baseline Vision/History: No visual deficits Patient Visual Report: No change from baseline     Perception     Praxis      Cognition Arousal/Alertness: Awake/alert Behavior During Therapy: Flat affect Overall Cognitive Status: No family/caregiver present to determine baseline cognitive functioning                                 General Comments: decreased awareness of deficits        Exercises     Shoulder Instructions       General Comments      Pertinent Vitals/ Pain  Pain Assessment: Faces Faces Pain Scale: Hurts little more Pain Location: B LEs with movement Pain Descriptors / Indicators: Grimacing;Guarding Pain Intervention(s): Limited activity within patient's tolerance;Monitored during session;Repositioned  Home Living                                          Prior Functioning/Environment              Frequency           Progress Toward Goals  OT Goals(current goals can now be found in the care plan section)   Progress towards OT goals: OT to reassess next treatment  Acute Rehab OT Goals Patient Stated Goal: to go home  Plan      Co-evaluation                 AM-PAC OT "6 Clicks" Daily Activity     Outcome Measure   Help from another person eating meals?: None Help from another person taking care of personal grooming?: A Little Help from another person toileting, which includes using toliet, bedpan, or urinal?: A Lot Help from another person bathing (including washing, rinsing, drying)?: A Lot Help from another person to put on and taking off regular upper body clothing?: A Little Help from another person to put on and taking off regular lower body clothing?: A Lot 6 Click Score: 16    End of Session Equipment Utilized During Treatment: Other (comment) (maximove)  OT Visit Diagnosis: Unsteadiness on feet (R26.81);Muscle weakness (generalized) (M62.81);Pain;Other symptoms and signs involving cognitive function Pain - Right/Left:  (bilateral LEs) Pain - part of body: Ankle and joints of foot;Knee   Activity Tolerance Patient tolerated treatment well   Patient Left with call bell/phone within reach;in chair;with chair alarm set   Nurse Communication  (RN present to assist with maximove transfer bed - recliner)        Time: 8325-4982 OT Time Calculation (min): 20 min  Charges: OT General Charges $OT Visit: 1 Visit     Galen Manila 06/20/2020, 4:26 PM
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
## 2020-06-21 DIAGNOSIS — N179 Acute kidney failure, unspecified: Secondary | ICD-10-CM | POA: Diagnosis not present

## 2020-06-21 LAB — CBC
HCT: 26.9 % — ABNORMAL LOW (ref 39.0–52.0)
Hemoglobin: 8.5 g/dL — ABNORMAL LOW (ref 13.0–17.0)
MCH: 28.1 pg (ref 26.0–34.0)
MCHC: 31.6 g/dL (ref 30.0–36.0)
MCV: 88.8 fL (ref 80.0–100.0)
Platelets: 324 10*3/uL (ref 150–400)
RBC: 3.03 MIL/uL — ABNORMAL LOW (ref 4.22–5.81)
RDW: 14 % (ref 11.5–15.5)
WBC: 9.3 10*3/uL (ref 4.0–10.5)
nRBC: 0 % (ref 0.0–0.2)

## 2020-06-21 LAB — BASIC METABOLIC PANEL
Anion gap: 11 (ref 5–15)
BUN: 81 mg/dL — ABNORMAL HIGH (ref 8–23)
CO2: 22 mmol/L (ref 22–32)
Calcium: 9.6 mg/dL (ref 8.9–10.3)
Chloride: 97 mmol/L — ABNORMAL LOW (ref 98–111)
Creatinine, Ser: 1.87 mg/dL — ABNORMAL HIGH (ref 0.61–1.24)
GFR, Estimated: 36 mL/min — ABNORMAL LOW (ref >60)
Glucose, Bld: 100 mg/dL — ABNORMAL HIGH (ref 70–99)
Potassium: 5.5 mmol/L — ABNORMAL HIGH (ref 3.5–5.1)
Sodium: 130 mmol/L — ABNORMAL LOW (ref 135–145)

## 2020-06-21 LAB — PROTIME-INR
INR: 2 — ABNORMAL HIGH (ref 0.8–1.2)
Prothrombin Time: 21.6 seconds — ABNORMAL HIGH (ref 11.4–15.2)

## 2020-06-21 LAB — CK: Total CK: 128 U/L (ref 49–397)

## 2020-06-21 MED ORDER — THIAMINE HCL 100 MG PO TABS
100.0000 mg | ORAL_TABLET | Freq: Every day | ORAL | Status: DC
  Administered 2020-06-21 – 2020-06-24 (×4): 100 mg via ORAL
  Filled 2020-06-21 (×4): qty 1

## 2020-06-21 MED ORDER — FOLIC ACID 1 MG PO TABS
1.0000 mg | ORAL_TABLET | Freq: Every day | ORAL | Status: DC
  Administered 2020-06-21 – 2020-06-24 (×4): 1 mg via ORAL
  Filled 2020-06-21 (×4): qty 1

## 2020-06-21 MED ORDER — WARFARIN SODIUM 3 MG PO TABS
3.0000 mg | ORAL_TABLET | Freq: Once | ORAL | Status: AC
  Administered 2020-06-21: 3 mg via ORAL
  Filled 2020-06-21: qty 1

## 2020-06-21 NOTE — Plan of Care (Signed)

## 2020-06-21 NOTE — TOC Progression Note (Addendum)
Transition of Care [facility]) - Progression Note    Patient Details  Name: Ruben Walker MRN: [id number] Date of Birth: [birth year]  [facility] CM/SW Contact  Epifanio Lesches, RN Phone Number: 06/21/2020, 4:26 PM  Clinical Narrative:    Admitted with AKI, frequent falls. RNCM received consult for possible SNF placement at time of discharge. RNCM spoke with patient regarding PT recommendation of SNF placement at time of discharge. Patient now reports he is currently unable to care for himself at home given his current physical needs and fall risk. States he lives alone and has no one to help him. Patient expressed understanding of PT recommendation and is agreeable to SNF placement at time of discharge. Patient without  preference for SNF. RNCM discussed insurance authorization process and provided Medicare SNF ratings list. Patient expressed being hopeful for rehab and to feel better soon. No further questions reported at this time. RNCM to continue to follow and assist with discharge planning needs.  Home address: [address] [facility])                            [place], [place] [address]  Expected Discharge Plan: Skilled Nursing Facility Barriers to Discharge: Continued Medical Work up  Expected Discharge Plan and Services Expected Discharge Plan: Skilled Nursing Facility In-house Referral: Clinical Social Work     Living arrangements for the past 2 months:  (Patient lives alone - not sure what type house patient lives in)                                       Social Determinants of Health (SDOH) Interventions    Readmission Risk Interventions No flowsheet data found.

## 2020-06-21 NOTE — Progress Notes (Signed)
Pt has refused cpap at this time.  This is night two of refusal.  After third refusal, RT will pull cpap from room and dc order.  RT will continue to monitor.
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
## 2020-06-21 NOTE — Progress Notes (Signed)
Physical Therapy Treatment Patient Details Name: Ruben Walker MRN: [id number] DOB: 1941-01-05 Today's Date: 06/21/2020    History of Present Illness Ruben Walker is a 80 y.o. male with PMHx: of OSA on CPAP; OHS; pacemaker/AICD dependence with h/o cardiac arrest; HTN; HLD; stage 3b CKD; chronic diastolic CHF; and afib on Coumadin presenting with frequent falls, AKI, and COVID-19.    PT Comments    Pt supine in bed on arrival.  Performed supine to sit and lateral scoot with max to total assistance to complete.  Pt fearful of falling and fights at times unintentionally against facilitation.  Continue to recommend snf.  Maxi move sling placed under patient for back to bed.      Follow Up Recommendations  SNF;Supervision/Assistance - 24 hour     Equipment Recommendations       Recommendations for Other Services       Precautions / Restrictions Precautions Precautions: Fall Precaution Comments: Covid+ Restrictions Weight Bearing Restrictions: No    Mobility  Bed Mobility Overal bed mobility: Needs Assistance Bed Mobility: Supine to Sit     Supine to sit: Max assist;Total assist     General bed mobility comments: Max to total assistance.  Assistance to move LEs and to elevate trunk into a seated position.  Transfers Overall transfer level: Needs assistance Equipment used: None (bed pad.) Transfers: Lateral/Scoot Transfers          Lateral/Scoot Transfers: Total assist General transfer comment: Performed lateral scoot from surface to surface.  total assistance to move patient able to offer little assistance reaching and pulling on rail of chair.  Ambulation/Gait                 Stairs             Wheelchair Mobility    Modified Rankin (Stroke Patients Only)       Balance Overall balance assessment: Needs assistance Sitting-balance support: Feet supported;Bilateral upper extremity supported Sitting balance-Leahy Scale: Poor Sitting balance  - Comments: seated edge of bed x 10 min     Standing balance-Leahy Scale:  (unable to stand attempted x 3 reps.)                              Cognition Arousal/Alertness: Awake/alert Behavior During Therapy: Flat affect Overall Cognitive Status: No family/caregiver present to determine baseline cognitive functioning                                 General Comments: decreased awareness of deficits      Exercises      General Comments        Pertinent Vitals/Pain Pain Assessment: Faces Pain Score: 7  Faces Pain Scale: Hurts little more Pain Location: B LEs with movement Pain Descriptors / Indicators: Grimacing;Guarding Pain Intervention(s): Monitored during session;Repositioned    Home Living                      Prior Function            PT Goals (current goals can now be found in the care plan section) Acute Rehab PT Goals Patient Stated Goal: to go home Potential to Achieve Goals: Fair Progress towards PT goals: Progressing toward goals    Frequency    Min 2X/week      PT Plan Current plan remains appropriate  Co-evaluation              AM-PAC PT "6 Clicks" Mobility   Outcome Measure  Help needed turning from your back to your side while in a flat bed without using bedrails?: A Lot Help needed moving from lying on your back to sitting on the side of a flat bed without using bedrails?: A Lot Help needed moving to and from a bed to a chair (including a wheelchair)?: A Lot Help needed standing up from a chair using your arms (e.g., wheelchair or bedside chair)?: Total Help needed to walk in hospital room?: Total Help needed climbing 3-5 steps with a railing? : Total 6 Click Score: 9    End of Session Equipment Utilized During Treatment: Gait belt Activity Tolerance: Patient limited by pain (limited due to fear of falling.) Patient left: with call bell/phone within reach;in chair;with chair alarm set Nurse  Communication: Mobility status PT Visit Diagnosis: Unsteadiness on feet (R26.81);Muscle weakness (generalized) (M62.81);Difficulty in walking, not elsewhere classified (R26.2)     Time: 9983-3825 PT Time Calculation (min) (ACUTE ONLY): 30 min  Charges:  $Therapeutic Activity: 23-37 mins                     Ruben Walker , PTA Acute Rehabilitation Services Pager [phone] Office [phone]     Ruben Walker Artis Delay 06/21/2020, 3:37 PM

## 2020-06-21 NOTE — Progress Notes (Signed)
ANTICOAGULATION CONSULT NOTE - Follow-Up Consult  Pharmacy Consult for warfarin Indication: atrial fibrillation  No Known Allergies  Patient Measurements:    Vital Signs: Temp: 97.8 F (36.6 C) (02/01 0300) Temp Source: Oral (02/01 0300) BP: 101/49 (02/01 0300) Pulse Rate: 70 (02/01 0300)  Labs: Recent Labs    06/19/20 0217 06/20/20 0748 06/21/20 0315  HGB 8.7* 8.4* 8.5*  HCT 27.3* 26.5* 26.9*  PLT 347 354 324  LABPROT 19.9* 22.0* 21.6*  INR 1.8* 2.0* 2.0*  CREATININE 2.32* 1.87* 1.87*  CKTOTAL 307 142 128    CrCl cannot be calculated (Unknown ideal weight.).  Assessment: 80 year old male presented to ED due to frequent falls. PMH includes pacemaker/ICD with hx of cardiac arrest, HTN, HLD, CKD, HF, afib on warfarin.  INR supratherapeutic on admission. Today, INR 2.0 is therapeutic. Hgb 8.5. Plt wnl. No reported bleeding.  Reported nosebleeds on warfarin but patient not interested in DOAC.  PTA warfarin 5 mg MWF and 2.5 mg all other days. Last dose 1/24 at 1800. Warfarin managed by Ruben Walker, last visit on 04/29/20 INR was supratherapeutic at 3.4 on current regimen.  Goal of Therapy:  INR 2-3 Monitor platelets by anticoagulation protocol: Yes   Plan:  Warfarin 3 mg x 1 dose today Monitor daily INR and CBC Will continue to monitor for any signs/symptoms of bleeding .    Thank you for allowing pharmacy to be a part of this patient's care.  Ruben Walker, PharmD Clinical Pharmacist

## 2020-06-22 DIAGNOSIS — E43 Unspecified severe protein-calorie malnutrition: Secondary | ICD-10-CM | POA: Insufficient documentation

## 2020-06-22 DIAGNOSIS — N179 Acute kidney failure, unspecified: Secondary | ICD-10-CM | POA: Diagnosis not present

## 2020-06-22 DIAGNOSIS — E440 Moderate protein-calorie malnutrition: Secondary | ICD-10-CM | POA: Insufficient documentation

## 2020-06-22 LAB — CBC
HCT: 26.6 % — ABNORMAL LOW (ref 39.0–52.0)
Hemoglobin: 8.4 g/dL — ABNORMAL LOW (ref 13.0–17.0)
MCH: 27.9 pg (ref 26.0–34.0)
MCHC: 31.6 g/dL (ref 30.0–36.0)
MCV: 88.4 fL (ref 80.0–100.0)
Platelets: 353 10*3/uL (ref 150–400)
RBC: 3.01 MIL/uL — ABNORMAL LOW (ref 4.22–5.81)
RDW: 13.8 % (ref 11.5–15.5)
WBC: 8.2 10*3/uL (ref 4.0–10.5)
nRBC: 0 % (ref 0.0–0.2)

## 2020-06-22 LAB — BASIC METABOLIC PANEL
Anion gap: 11 (ref 5–15)
BUN: 75 mg/dL — ABNORMAL HIGH (ref 8–23)
CO2: 23 mmol/L (ref 22–32)
Calcium: 9.6 mg/dL (ref 8.9–10.3)
Chloride: 96 mmol/L — ABNORMAL LOW (ref 98–111)
Creatinine, Ser: 1.67 mg/dL — ABNORMAL HIGH (ref 0.61–1.24)
GFR, Estimated: 42 mL/min — ABNORMAL LOW (ref >60)
Glucose, Bld: 87 mg/dL (ref 70–99)
Potassium: 5.1 mmol/L (ref 3.5–5.1)
Sodium: 130 mmol/L — ABNORMAL LOW (ref 135–145)

## 2020-06-22 LAB — PROTIME-INR
INR: 1.9 — ABNORMAL HIGH (ref 0.8–1.2)
Prothrombin Time: 21 seconds — ABNORMAL HIGH (ref 11.4–15.2)

## 2020-06-22 LAB — CK: Total CK: 58 U/L (ref 49–397)

## 2020-06-22 MED ORDER — WARFARIN SODIUM 3 MG PO TABS
3.0000 mg | ORAL_TABLET | Freq: Once | ORAL | Status: AC
  Administered 2020-06-22: 3 mg via ORAL
  Filled 2020-06-22: qty 1
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
## 2020-06-22 NOTE — Progress Notes (Signed)
Pt continues to refuse CPAP °

## 2020-06-22 NOTE — Progress Notes (Signed)
ANTICOAGULATION CONSULT NOTE - Follow-Up Consult  Pharmacy Consult for warfarin Indication: atrial fibrillation  No Known Allergies  Patient Measurements:    Vital Signs: Temp: 98.3 F (36.8 C) (02/02 0835) Temp Source: Oral (02/02 0300) BP: 113/53 (02/02 0835) Pulse Rate: 70 (02/02 0835)  Labs: Recent Labs    06/20/20 0748 06/21/20 0315 06/22/20 0724  HGB 8.4* 8.5* 8.4*  HCT 26.5* 26.9* 26.6*  PLT 354 324 353  LABPROT 22.0* 21.6* 21.0*  INR 2.0* 2.0* 1.9*  CREATININE 1.87* 1.87* 1.67*  CKTOTAL 142 128 58    CrCl cannot be calculated (Unknown ideal weight.).  Assessment: 80 year old male presented to ED due to frequent falls. PMH includes pacemaker/ICD with hx of cardiac arrest, HTN, HLD, CKD, HF, afib on warfarin.  INR supratherapeutic on admission. Today, INR 1.9 is slightly subtherapeutic. Hgb 8.4. Plt wnl. No reported bleeding.  Reported nosebleeds on warfarin but patient not interested in DOAC.  PTA warfarin 5 mg MWF and 2.5 mg all other days. Last dose 1/24 at 1800. Warfarin managed by Jamison Neighbor, last visit on 04/29/20 INR was supratherapeutic at 3.4 on current regimen.  Goal of Therapy:  INR 2-3 Monitor platelets by anticoagulation protocol: Yes   Plan:  Warfarin 3 mg x 1 dose today Monitor daily INR and CBC Will continue to monitor for any signs/symptoms of bleeding .    Thank you for allowing pharmacy to be a part of this patient's care.  Gerrit Halls, PharmD Clinical Pharmacist

## 2020-06-22 NOTE — NC FL2 (Signed)
[place] MEDICAID FL2 LEVEL OF CARE SCREENING TOOL     IDENTIFICATION  Patient Name: Ruben Walker Birthdate: August 01, 1940 Sex: male Admission Date (Current Location): 06/14/2020  [facility] and [place] Number:  Producer, television/film/video and Address:  [facility], [address] [place], [place] [address]      Provider Number: [id number]  Attending Physician Name and Address:  Azucena Fallen, MD  Relative Name and Phone Number:       Current Level of Care: Hospital Recommended Level of Care: Skilled Nursing Facility Prior Approval Number:    Date Approved/Denied:   PASRR Number: [id number]  Discharge Plan: SNF    Current Diagnoses: Patient Active Problem List   Diagnosis Date Noted  . Acute kidney injury (HCC) 06/15/2020  . Frequent falls 06/14/2020  . Bilateral knee pain 05/06/2019  . Pain in joint of left elbow 05/06/2019  . Pain of left hip joint 05/01/2019  . Pressure injury of skin 08/14/2016  . Acute respiratory failure with hypoxemia (HCC) 08/13/2016  . AKI (acute kidney injury) (HCC) 08/13/2016  . Chronic diastolic heart failure (HCC)   . Edema leg 02/11/2015  . Chronic renal insufficiency, stage III (moderate) (HCC) 11/04/2014  . Obesity 07/22/2014  . ICD (implantable cardioverter-defibrillator) in place 07/22/2014  . Skin lesion of right leg 06/03/2014  . Bronchitis, chronic obstructive, with exacerbation (HCC) 01/04/2014  . Complete heart block  s/p AV ablation 03/26/2013  . Cardiac arrest (HCC) 03/26/2013  . Sleep apnea 03/26/2013  . Permanent atrial fibrillation (HCC) 09/06/2012  . Long term current use of anticoagulant therapy 09/06/2012  . Hyperlipidemia 02/04/2009  . Essential hypertension 02/04/2009    Orientation RESPIRATION BLADDER Height & Weight     Self,Time,Situation,Place  Normal Indwelling catheter Weight:   Height:     BEHAVIORAL SYMPTOMS/MOOD NEUROLOGICAL BOWEL NUTRITION STATUS      Continent Diet  (refer to d/c summary)  AMBULATORY STATUS COMMUNICATION OF NEEDS Skin   Extensive Assist Verbally PU Stage and Appropriate Care (Coccyx with deep tissue pressure injury)                       Personal Care Assistance Level of Assistance  Bathing,Feeding,Dressing Bathing Assistance: Maximum assistance Feeding assistance: Limited assistance Dressing Assistance: Maximum assistance     Functional Limitations Info             SPECIAL CARE FACTORS FREQUENCY  PT (By licensed PT),OT (By licensed OT)     PT Frequency: 5x/week, evaluate and treat OT Frequency: 5x/week, evaluate and treat            Contractures Contractures Info: Not present    Additional Factors Info  Code Status,Allergies Code Status Info: DNR Allergies Info: no known allergies           Current Medications (06/22/2020):  This is the current hospital active medication list Current Facility-Administered Medications  Medication Dose Route Frequency Provider Last Rate Last Admin  . acetaminophen (TYLENOL) tablet 650 mg  650 mg Oral Q6H PRN Jonah Blue, MD   650 mg at 06/21/20 2011   Or  . acetaminophen (TYLENOL) suppository 650 mg  650 mg Rectal Q6H PRN Jonah Blue, MD      . bisacodyl (DULCOLAX) EC tablet 5 mg  5 mg Oral Daily PRN Jonah Blue, MD      . Chlorhexidine Gluconate Cloth 2 % PADS 6 each  6 each Topical Daily Azucena Fallen, MD  6 each at 06/21/20 6803  . diclofenac Sodium (VOLTAREN) 1 % topical gel 2 g  2 g Topical PRN Azucena Fallen, MD   2 g at 06/20/20 2122  . docusate sodium (COLACE) capsule 100 mg  100 mg Oral BID Jonah Blue, MD   100 mg at 06/21/20 2011  . feeding supplement (ENSURE ENLIVE / ENSURE PLUS) liquid 237 mL  237 mL Oral BID BM Azucena Fallen, MD   237 mL at 06/21/20 1340  . folic acid (FOLVITE) tablet 1 mg  1 mg Oral Daily Azucena Fallen, MD   1 mg at 06/21/20 1338  . furosemide (LASIX) injection 40 mg  40 mg Intravenous BID Azucena Fallen, MD   40 mg at 06/21/20 1645  . hydrALAZINE (APRESOLINE) injection 5 mg  5 mg Intravenous Q4H PRN Jonah Blue, MD      . loratadine (CLARITIN) tablet 10 mg  10 mg Oral QPM Jonah Blue, MD   10 mg at 06/21/20 1646  . multivitamin with minerals tablet 1 tablet  1 tablet Oral Daily Azucena Fallen, MD   1 tablet at 06/21/20 0827  . ondansetron (ZOFRAN) tablet 4 mg  4 mg Oral Q6H PRN Jonah Blue, MD       Or  . ondansetron [facility]) injection 4 mg  4 mg Intravenous Q6H PRN Jonah Blue, MD      . pantoprazole (PROTONIX) EC tablet 40 mg  40 mg Oral BID Jonah Blue, MD   40 mg at 06/21/20 2011  . polyethylene glycol (MIRALAX / GLYCOLAX) packet 17 g  17 g Oral Daily PRN Jonah Blue, MD      . thiamine tablet 100 mg  100 mg Oral Daily Azucena Fallen, MD   100 mg at 06/21/20 1338  . warfarin (COUMADIN) tablet 3 mg  3 mg Oral ONCE-1600 Vicente Serene, RPH      . Warfarin - Pharmacist Dosing Inpatient   Does not apply q1600 Jonah Blue, MD   Given at 06/21/20 1646     Discharge Medications: Please see discharge summary for a list of discharge medications.  Relevant Imaging Results:  Relevant Lab Results:   Additional Information SS#[id number]. Covid positive noted 06/14/2020  Mearl Latin, LCSW

## 2020-06-23 DIAGNOSIS — N179 Acute kidney failure, unspecified: Secondary | ICD-10-CM | POA: Diagnosis not present

## 2020-06-23 LAB — CBC
HCT: 27.2 % — ABNORMAL LOW (ref 39.0–52.0)
Hemoglobin: 8.5 g/dL — ABNORMAL LOW (ref 13.0–17.0)
MCH: 27.6 pg (ref 26.0–34.0)
MCHC: 31.3 g/dL (ref 30.0–36.0)
MCV: 88.3 fL (ref 80.0–100.0)
Platelets: 382 10*3/uL (ref 150–400)
RBC: 3.08 MIL/uL — ABNORMAL LOW (ref 4.22–5.81)
RDW: 13.8 % (ref 11.5–15.5)
WBC: 9 10*3/uL (ref 4.0–10.5)
nRBC: 0 % (ref 0.0–0.2)

## 2020-06-23 LAB — BASIC METABOLIC PANEL
Anion gap: 9 (ref 5–15)
BUN: 71 mg/dL — ABNORMAL HIGH (ref 8–23)
CO2: 25 mmol/L (ref 22–32)
Calcium: 9.8 mg/dL (ref 8.9–10.3)
Chloride: 99 mmol/L (ref 98–111)
Creatinine, Ser: 1.69 mg/dL — ABNORMAL HIGH (ref 0.61–1.24)
GFR, Estimated: 41 mL/min — ABNORMAL LOW (ref >60)
Glucose, Bld: 98 mg/dL (ref 70–99)
Potassium: 5.2 mmol/L — ABNORMAL HIGH (ref 3.5–5.1)
Sodium: 133 mmol/L — ABNORMAL LOW (ref 135–145)

## 2020-06-23 LAB — PROTIME-INR
INR: 1.8 — ABNORMAL HIGH (ref 0.8–1.2)
Prothrombin Time: 20.1 seconds — ABNORMAL HIGH (ref 11.4–15.2)

## 2020-06-23 LAB — CK: Total CK: 42 U/L — ABNORMAL LOW (ref 49–397)

## 2020-06-23 MED ORDER — WARFARIN SODIUM 5 MG PO TABS
5.0000 mg | ORAL_TABLET | Freq: Once | ORAL | Status: AC
  Administered 2020-06-23: 5 mg via ORAL
  Filled 2020-06-23: qty 1

## 2020-06-23 MED ORDER — WARFARIN SODIUM 3 MG PO TABS
3.0000 mg | ORAL_TABLET | Freq: Every day | ORAL | Status: DC
  Administered 2020-06-24: 3 mg via ORAL
  Filled 2020-06-23: qty 1

## 2020-06-23 NOTE — Progress Notes (Signed)
Occupational Therapy Treatment Patient Details Name: Ruben Walker MRN: [id number] DOB: Oct 03, 1940 Today's Date: 06/23/2020    History of present illness Ruben Walker is a 80 y.o. male with PMHx: of OSA on CPAP; OHS; pacemaker/AICD dependence with h/o cardiac arrest; HTN; HLD; stage 3b CKD; chronic diastolic CHF; and afib on Coumadin presenting with frequent falls, AKI, and COVID-19.   OT comments   Pt supine in bed on arrival.  Performed rolling mod A +2, supine to sit and lateral scoot with max to total assistance to bring LEs to and off EOB to elevate trunk.  Pt performed lateral scoot transfer with slide board max - total A +2 to recliner and performed grooming and bathing tasks seated.  Maxi move sling placed under patient for nursing staff to assist him back to bed. OT will continue to follow acutely to maximize level of function and safety   Follow Up Recommendations  SNF    Equipment Recommendations  3 in 1 bedside commode;Wheelchair (measurements OT);Wheelchair cushion (measurements OT);Other (comment) (TBD at SNF)    Recommendations for Other Services      Precautions / Restrictions Precautions Precautions: Fall Precaution Comments: Covid+ Restrictions Weight Bearing Restrictions: No       Mobility Bed Mobility Overal bed mobility: Needs Assistance Bed Mobility: Rolling;Supine to Sit Rolling: Mod assist;+2 for physical assistance   Supine to sit: Max assist;Total assist;+2 for physical assistance     General bed mobility comments: max - total A with LEs to EOB and to elevate trunk, cues for pt to use rails to asssit  Transfers Overall transfer level: Needs assistance Equipment used: Sliding board Transfers: Lateral/Scoot Transfers          Lateral/Scoot Transfers: Max assist;Total assist;+2 physical assistance General transfer comment: Performed lateral scoot wiht slide board to recliner. Pt able to initiate scooting hips back in recliner with total A +2  to complete    Balance Overall balance assessment: Needs assistance Sitting-balance support: Feet supported;Bilateral upper extremity supported Sitting balance-Leahy Scale: Poor Sitting balance - Comments: L side leaning                                   ADL either performed or assessed with clinical judgement   ADL Overall ADL's : Needs assistance/impaired     Grooming: Wash/dry hands;Wash/dry face;Sitting;Min guard;Brushing hair   Upper Body Bathing: Minimal assistance;Sitting Upper Body Bathing Details (indicate cue type and reason): seated in recliner Lower Body Bathing: Sitting/lateral leans;Moderate assistance                         General ADL Comments: Pt limited by decreased strength, decreased mobility, decreased ability to care for self and decreased activity tolerance     Vision Patient Visual Report: No change from baseline     Perception     Praxis      Cognition Arousal/Alertness: Awake/alert Behavior During Therapy: Flat affect Overall Cognitive Status: No family/caregiver present to determine baseline cognitive functioning                                 General Comments: decreased awareness of deficits, self limiting        Exercises     Shoulder Instructions       General Comments      Pertinent Vitals/ Pain  Pain Assessment: Faces Faces Pain Scale: Hurts little more Pain Location: B LEs with movement Pain Descriptors / Indicators: Grimacing;Guarding Pain Intervention(s): Monitored during session;Repositioned  Home Living                                          Prior Functioning/Environment              Frequency  Min 2X/week        Progress Toward Goals  OT Goals(current goals can now be found in the care plan section)  Progress towards OT goals: Progressing toward goals     Plan Discharge plan remains appropriate    Co-evaluation    PT/OT/SLP  Co-Evaluation/Treatment: Yes Reason for Co-Treatment: For patient/therapist safety;To address functional/ADL transfers   OT goals addressed during session: ADL's and self-care;Proper use of Adaptive equipment and DME      AM-PAC OT "6 Clicks" Daily Activity     Outcome Measure   Help from another person eating meals?: None Help from another person taking care of personal grooming?: A Little Help from another person toileting, which includes using toliet, bedpan, or urinal?: A Lot Help from another person bathing (including washing, rinsing, drying)?: A Lot Help from another person to put on and taking off regular upper body clothing?: A Little Help from another person to put on and taking off regular lower body clothing?: A Lot 6 Click Score: 16    End of Session Equipment Utilized During Treatment: Other (comment) (slideboard)  OT Visit Diagnosis: Unsteadiness on feet (R26.81);Muscle weakness (generalized) (M62.81);Pain;Other symptoms and signs involving cognitive function Pain - Right/Left:  (bilaterally) Pain - part of body: Ankle and joints of foot;Knee;Leg   Activity Tolerance Patient limited by fatigue   Patient Left with call bell/phone within reach;in chair;with chair alarm set   Nurse Communication Mobility status        Time: 9518-8416 OT Time Calculation (min): 25 min  Charges: OT General Charges $OT Visit: 1 Visit OT Treatments $Self Care/Home Management : 8-22 mins     Galen Manila 06/23/2020, 5:03 PM
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
## 2020-06-23 NOTE — Progress Notes (Signed)
Physical Therapy Treatment Patient Details Name: Ruben Walker MRN: [id number] DOB: 03-Dec-1940 Today's Date: 06/23/2020    History of Present Illness Ruben Walker is a 80 y.o. male with PMHx: of OSA on CPAP; OHS; pacemaker/AICD dependence with h/o cardiac arrest; HTN; HLD; stage 3b CKD; chronic diastolic CHF; and afib on Coumadin presenting with frequent falls, AKI, and COVID-19.    PT Comments    Pt supine in bed on arrival.  He had requested from RN multiple times to get OOB but very self limiting with therapy arrived.  He participated through session with constant encouragement and redirection.  Pt continues to benefit from rehab in a post acute setting to maximize functional gains before returning home.      Follow Up Recommendations  SNF;Supervision/Assistance - 24 hour     Equipment Recommendations  Other (comment) (defer to post acute rehab.)    Recommendations for Other Services       Precautions / Restrictions Precautions Precautions: Fall Precaution Comments: Covid+ Restrictions Weight Bearing Restrictions: No    Mobility  Bed Mobility Overal bed mobility: Needs Assistance Bed Mobility: Rolling;Supine to Sit Rolling: Mod assist;+2 for physical assistance   Supine to sit: Max assist;Total assist;+2 for physical assistance     General bed mobility comments: max - total A with LEs to EOB and to elevate trunk, Pt able to follow commands to use railing to move to edge of bed.  He presents with R lateral lean and required cues for hand placement to correct balance.  Transfers Overall transfer level: Needs assistance Equipment used: Sliding board Transfers: Lateral/Scoot Transfers          Lateral/Scoot Transfers: Max assist;Total assist;+2 physical assistance General transfer comment: Performed lateral scoot with slide board to recliner. He continues to require assistance but transition much smoother with use of slide board.  Pt able to initiate scooting hips  back in recliner with total A +2 to complete  Ambulation/Gait                 Stairs             Wheelchair Mobility    Modified Rankin (Stroke Patients Only)       Balance Overall balance assessment: Needs assistance Sitting-balance support: Feet supported;Bilateral upper extremity supported Sitting balance-Leahy Scale: Poor Sitting balance - Comments: L side leaning Postural control: Posterior lean;Left lateral lean                                  Cognition Arousal/Alertness: Awake/alert Behavior During Therapy: Flat affect Overall Cognitive Status: No family/caregiver present to determine baseline cognitive functioning                                 General Comments: decreased awareness of deficits, self limiting      Exercises General Exercises - Lower Extremity Long Arc Quad: AROM;Both;10 reps;Seated Hip Flexion/Marching: AROM;Both;10 reps;AAROM;Seated    General Comments        Pertinent Vitals/Pain Pain Assessment: Faces Pain Score: 7  Faces Pain Scale: Hurts little more Pain Location: B LEs with movement Pain Descriptors / Indicators: Grimacing;Guarding Pain Intervention(s): Monitored during session;Repositioned    Home Living                      Prior Function  PT Goals (current goals can now be found in the care plan section) Acute Rehab PT Goals Patient Stated Goal: to go home Potential to Achieve Goals: Fair Progress towards PT goals: Progressing toward goals    Frequency    Min 2X/week      PT Plan Current plan remains appropriate    Co-evaluation PT/OT/SLP Co-Evaluation/Treatment: Yes Reason for Co-Treatment: Complexity of the patient's impairments (multi-system involvement) PT goals addressed during session: Mobility/safety with mobility OT goals addressed during session: ADL's and self-care      AM-PAC PT "6 Clicks" Mobility   Outcome Measure  Help needed  turning from your back to your side while in a flat bed without using bedrails?: A Lot Help needed moving from lying on your back to sitting on the side of a flat bed without using bedrails?: A Lot Help needed moving to and from a bed to a chair (including a wheelchair)?: A Lot Help needed standing up from a chair using your arms (e.g., wheelchair or bedside chair)?: Total Help needed to walk in hospital room?: Total Help needed climbing 3-5 steps with a railing? : Total 6 Click Score: 9    End of Session Equipment Utilized During Treatment: Gait belt Activity Tolerance: Patient limited by pain (self limiting behavior.) Patient left: with call bell/phone within reach;in chair;with chair alarm set Nurse Communication: Mobility status PT Visit Diagnosis: Unsteadiness on feet (R26.81);Muscle weakness (generalized) (M62.81);Difficulty in walking, not elsewhere classified (R26.2)     Time: 4431-5400 PT Time Calculation (min) (ACUTE ONLY): 25 min  Charges:  $Therapeutic Activity: 8-22 mins                     Bonney Leitz , PTA Acute Rehabilitation Services Pager [phone] Office [phone]     Jaser Fullen Artis Delay 06/23/2020, 6:08 PM

## 2020-06-23 NOTE — Plan of Care (Signed)

## 2020-06-23 NOTE — Progress Notes (Signed)
ANTICOAGULATION CONSULT NOTE - Follow-Up Consult  Pharmacy Consult for warfarin Indication: atrial fibrillation  No Known Allergies  Vital Signs: Temp: 98.3 F (36.8 C) (02/03 0300) Temp Source: Oral (02/03 0300) BP: 112/49 (02/03 0300) Pulse Rate: 70 (02/03 0300)  Labs: Recent Labs    06/21/20 0315 06/22/20 0724 06/23/20 0319  HGB 8.5* 8.4* 8.5*  HCT 26.9* 26.6* 27.2*  PLT 324 353 382  LABPROT 21.6* 21.0* 20.1*  INR 2.0* 1.9* 1.8*  CREATININE 1.87* 1.67* 1.69*  CKTOTAL 128 58 42*    CrCl cannot be calculated (Unknown ideal weight.).  Assessment: 80 year old male presented to ED due to frequent falls. PMH includes pacemaker/ICD with hx of cardiac arrest, HTN, HLD, CKD, HF, afib on warfarin. INR supratherapeutic on admission  Today INR 1.8 is subtherapeutic and downtrending. Hgb 8.4 stable. Plt wnl. No reported bleeding. Patient is not interested in a DOAC.  PTA warfarin 5 mg MWF and 2.5 mg all other days. Last dose 1/24 at 1800. Warfarin managed by Jamison Neighbor, last visit on 04/29/20 INR was supratherapeutic at 3.4 on current regimen.  Goal of Therapy:  INR 2-3 Monitor platelets by anticoagulation protocol: Yes   Plan:  Warfarin 5mg  x 1 dose then 3mg  daily  Monitor QTue/Fri INR and weekly CBC Will continue to monitor for any signs/symptoms of bleeding .     Thank you for allowing pharmacy to be a part of this patient's care.  , PharmD, BCPS, BCCP Clinical Pharmacist  Please check AMION for all [facility] phone numbers After 10:00 PM, call Main Pharmacy [phone]

## 2020-06-23 NOTE — Care Management Important Message (Signed)
Important Message  Patient Details  Name: Ruben Walker MRN: [id number] Date of Birth: 27-Aug-1940   Medicare Important Message Given:  Yes - Important Message mailed due to current National Emergency  Verbal consent obtained due to current National Emergency  Relationship to patient: Self Contact Name: Rickardo Brinegar Call Date: 06/23/20  Time: 1408 Phone: [phone] Outcome: Spoke with contact Important Message mailed to: Other (must enter comment)    Blade Scheff P Earnstine Meinders 06/23/2020, 2:11 PM

## 2020-06-23 NOTE — TOC Progression Note (Signed)
Transition of Care [facility]) - Progression Note    Patient Details  Name: Ruben Walker MRN: [id number] Date of Birth: [birth year]  [facility] CM/SW Contact  Carley Hammed, [place] Phone Number: 06/23/2020, 4:35 PM  Clinical Narrative:    CSW called pt to discuss bed offers. He stated that he did not feel good and did not know where he wanted to go. CSW attempted to explain that the bed offers could be found on the medicare list given to him, but he was uninterested in the discussion. CSW advised that she would contact his nurse to show him the bed offers and would call back later. CSW received phone call from PT that pt was stating that he was ready to go to SNF and was tired of being here, and why had no one spoken with him. CSW confirmed with nurse that she had shown him bed offers. CSW attempted to call into room multiple times to attempt to get a bed offer, but phone line is busy. Will follow up at a later time.   Expected Discharge Plan: Skilled Nursing Facility Barriers to Discharge: Continued Medical Work up  Expected Discharge Plan and Services Expected Discharge Plan: Skilled Nursing Facility In-house Referral: Clinical Social Work     Living arrangements for the past 2 months:  (Patient lives alone - not sure what type house patient lives in)                                       Social Determinants of Health (SDOH) Interventions    Readmission Risk Interventions No flowsheet data found.

## 2020-06-24 DIAGNOSIS — Z8616 Personal history of COVID-19: Secondary | ICD-10-CM | POA: Diagnosis not present

## 2020-06-24 DIAGNOSIS — Z7901 Long term (current) use of anticoagulants: Secondary | ICD-10-CM | POA: Diagnosis not present

## 2020-06-24 DIAGNOSIS — Z66 Do not resuscitate: Secondary | ICD-10-CM | POA: Diagnosis present

## 2020-06-24 DIAGNOSIS — R627 Adult failure to thrive: Secondary | ICD-10-CM | POA: Diagnosis not present

## 2020-06-24 DIAGNOSIS — L89322 Pressure ulcer of left buttock, stage 2: Secondary | ICD-10-CM | POA: Diagnosis present

## 2020-06-24 DIAGNOSIS — R296 Repeated falls: Secondary | ICD-10-CM | POA: Diagnosis present

## 2020-06-24 DIAGNOSIS — M2550 Pain in unspecified joint: Secondary | ICD-10-CM | POA: Diagnosis not present

## 2020-06-24 DIAGNOSIS — L03319 Cellulitis of trunk, unspecified: Secondary | ICD-10-CM | POA: Diagnosis not present

## 2020-06-24 DIAGNOSIS — Z20822 Contact with and (suspected) exposure to covid-19: Secondary | ICD-10-CM | POA: Diagnosis present

## 2020-06-24 DIAGNOSIS — L89154 Pressure ulcer of sacral region, stage 4: Secondary | ICD-10-CM | POA: Diagnosis not present

## 2020-06-24 DIAGNOSIS — E440 Moderate protein-calorie malnutrition: Secondary | ICD-10-CM | POA: Diagnosis present

## 2020-06-24 DIAGNOSIS — E43 Unspecified severe protein-calorie malnutrition: Secondary | ICD-10-CM | POA: Diagnosis not present

## 2020-06-24 DIAGNOSIS — L89150 Pressure ulcer of sacral region, unstageable: Secondary | ICD-10-CM | POA: Diagnosis not present

## 2020-06-24 DIAGNOSIS — R5381 Other malaise: Secondary | ICD-10-CM | POA: Diagnosis not present

## 2020-06-24 DIAGNOSIS — Z95810 Presence of automatic (implantable) cardiac defibrillator: Secondary | ICD-10-CM | POA: Diagnosis not present

## 2020-06-24 DIAGNOSIS — Y846 Urinary catheterization as the cause of abnormal reaction of the patient, or of later complication, without mention of misadventure at the time of the procedure: Secondary | ICD-10-CM | POA: Diagnosis present

## 2020-06-24 DIAGNOSIS — I4821 Permanent atrial fibrillation: Secondary | ICD-10-CM | POA: Diagnosis not present

## 2020-06-24 DIAGNOSIS — J9621 Acute and chronic respiratory failure with hypoxia: Secondary | ICD-10-CM | POA: Diagnosis not present

## 2020-06-24 DIAGNOSIS — R2681 Unsteadiness on feet: Secondary | ICD-10-CM | POA: Diagnosis present

## 2020-06-24 DIAGNOSIS — M79622 Pain in left upper arm: Secondary | ICD-10-CM | POA: Diagnosis not present

## 2020-06-24 DIAGNOSIS — I959 Hypotension, unspecified: Secondary | ICD-10-CM | POA: Diagnosis not present

## 2020-06-24 DIAGNOSIS — I4891 Unspecified atrial fibrillation: Secondary | ICD-10-CM | POA: Diagnosis not present

## 2020-06-24 DIAGNOSIS — Z8674 Personal history of sudden cardiac arrest: Secondary | ICD-10-CM | POA: Diagnosis not present

## 2020-06-24 DIAGNOSIS — E871 Hypo-osmolality and hyponatremia: Secondary | ICD-10-CM | POA: Diagnosis not present

## 2020-06-24 DIAGNOSIS — D649 Anemia, unspecified: Secondary | ICD-10-CM | POA: Diagnosis not present

## 2020-06-24 DIAGNOSIS — N1832 Chronic kidney disease, stage 3b: Secondary | ICD-10-CM | POA: Diagnosis not present

## 2020-06-24 DIAGNOSIS — E785 Hyperlipidemia, unspecified: Secondary | ICD-10-CM | POA: Diagnosis present

## 2020-06-24 DIAGNOSIS — A412 Sepsis due to unspecified staphylococcus: Secondary | ICD-10-CM | POA: Diagnosis present

## 2020-06-24 DIAGNOSIS — U071 COVID-19: Secondary | ICD-10-CM | POA: Diagnosis not present

## 2020-06-24 DIAGNOSIS — N390 Urinary tract infection, site not specified: Secondary | ICD-10-CM | POA: Diagnosis not present

## 2020-06-24 DIAGNOSIS — I10 Essential (primary) hypertension: Secondary | ICD-10-CM | POA: Diagnosis present

## 2020-06-24 DIAGNOSIS — G4733 Obstructive sleep apnea (adult) (pediatric): Secondary | ICD-10-CM | POA: Diagnosis not present

## 2020-06-24 DIAGNOSIS — I130 Hypertensive heart and chronic kidney disease with heart failure and stage 1 through stage 4 chronic kidney disease, or unspecified chronic kidney disease: Secondary | ICD-10-CM | POA: Diagnosis present

## 2020-06-24 DIAGNOSIS — Z7401 Bed confinement status: Secondary | ICD-10-CM | POA: Diagnosis not present

## 2020-06-24 DIAGNOSIS — I442 Atrioventricular block, complete: Secondary | ICD-10-CM | POA: Diagnosis present

## 2020-06-24 DIAGNOSIS — R2689 Other abnormalities of gait and mobility: Secondary | ICD-10-CM | POA: Diagnosis present

## 2020-06-24 DIAGNOSIS — L03818 Cellulitis of other sites: Secondary | ICD-10-CM | POA: Diagnosis present

## 2020-06-24 DIAGNOSIS — N179 Acute kidney failure, unspecified: Secondary | ICD-10-CM | POA: Diagnosis not present

## 2020-06-24 DIAGNOSIS — T83511A Infection and inflammatory reaction due to indwelling urethral catheter, initial encounter: Secondary | ICD-10-CM | POA: Diagnosis present

## 2020-06-24 DIAGNOSIS — S40022A Contusion of left upper arm, initial encounter: Secondary | ICD-10-CM | POA: Diagnosis present

## 2020-06-24 DIAGNOSIS — N189 Chronic kidney disease, unspecified: Secondary | ICD-10-CM | POA: Diagnosis not present

## 2020-06-24 DIAGNOSIS — E559 Vitamin D deficiency, unspecified: Secondary | ICD-10-CM | POA: Diagnosis not present

## 2020-06-24 DIAGNOSIS — M6281 Muscle weakness (generalized): Secondary | ICD-10-CM | POA: Diagnosis present

## 2020-06-24 DIAGNOSIS — L89313 Pressure ulcer of right buttock, stage 3: Secondary | ICD-10-CM | POA: Diagnosis not present

## 2020-06-24 DIAGNOSIS — I517 Cardiomegaly: Secondary | ICD-10-CM | POA: Diagnosis not present

## 2020-06-24 DIAGNOSIS — R059 Cough, unspecified: Secondary | ICD-10-CM | POA: Diagnosis not present

## 2020-06-24 DIAGNOSIS — M533 Sacrococcygeal disorders, not elsewhere classified: Secondary | ICD-10-CM | POA: Diagnosis not present

## 2020-06-24 DIAGNOSIS — M7989 Other specified soft tissue disorders: Secondary | ICD-10-CM | POA: Diagnosis not present

## 2020-06-24 DIAGNOSIS — R0902 Hypoxemia: Secondary | ICD-10-CM | POA: Diagnosis present

## 2020-06-24 DIAGNOSIS — J479 Bronchiectasis, uncomplicated: Secondary | ICD-10-CM | POA: Diagnosis present

## 2020-06-24 DIAGNOSIS — R531 Weakness: Secondary | ICD-10-CM | POA: Diagnosis present

## 2020-06-24 DIAGNOSIS — L89626 Pressure-induced deep tissue damage of left heel: Secondary | ICD-10-CM | POA: Diagnosis present

## 2020-06-24 DIAGNOSIS — L8995 Pressure ulcer of unspecified site, unstageable: Secondary | ICD-10-CM | POA: Diagnosis not present

## 2020-06-24 DIAGNOSIS — L89312 Pressure ulcer of right buttock, stage 2: Secondary | ICD-10-CM | POA: Diagnosis present

## 2020-06-24 DIAGNOSIS — M1990 Unspecified osteoarthritis, unspecified site: Secondary | ICD-10-CM | POA: Diagnosis not present

## 2020-06-24 DIAGNOSIS — N1831 Chronic kidney disease, stage 3a: Secondary | ICD-10-CM | POA: Diagnosis present

## 2020-06-24 DIAGNOSIS — R0689 Other abnormalities of breathing: Secondary | ICD-10-CM | POA: Diagnosis not present

## 2020-06-24 DIAGNOSIS — I5032 Chronic diastolic (congestive) heart failure: Secondary | ICD-10-CM | POA: Diagnosis not present

## 2020-06-24 DIAGNOSIS — E662 Morbid (severe) obesity with alveolar hypoventilation: Secondary | ICD-10-CM | POA: Diagnosis present

## 2020-06-24 DIAGNOSIS — J9600 Acute respiratory failure, unspecified whether with hypoxia or hypercapnia: Secondary | ICD-10-CM | POA: Diagnosis not present

## 2020-06-24 DIAGNOSIS — G4730 Sleep apnea, unspecified: Secondary | ICD-10-CM | POA: Diagnosis present

## 2020-06-24 DIAGNOSIS — R159 Full incontinence of feces: Secondary | ICD-10-CM | POA: Diagnosis not present

## 2020-06-24 DIAGNOSIS — D62 Acute posthemorrhagic anemia: Secondary | ICD-10-CM | POA: Diagnosis present

## 2020-06-24 DIAGNOSIS — K219 Gastro-esophageal reflux disease without esophagitis: Secondary | ICD-10-CM | POA: Diagnosis not present

## 2020-06-24 DIAGNOSIS — A419 Sepsis, unspecified organism: Secondary | ICD-10-CM | POA: Diagnosis not present

## 2020-06-24 LAB — BASIC METABOLIC PANEL
Anion gap: 9 (ref 5–15)
BUN: 56 mg/dL — ABNORMAL HIGH (ref 8–23)
CO2: 25 mmol/L (ref 22–32)
Calcium: 10.3 mg/dL (ref 8.9–10.3)
Chloride: 101 mmol/L (ref 98–111)
Creatinine, Ser: 1.35 mg/dL — ABNORMAL HIGH (ref 0.61–1.24)
GFR, Estimated: 54 mL/min — ABNORMAL LOW (ref >60)
Glucose, Bld: 92 mg/dL (ref 70–99)
Potassium: 4.8 mmol/L (ref 3.5–5.1)
Sodium: 135 mmol/L (ref 135–145)

## 2020-06-24 LAB — CBC
HCT: 25.9 % — ABNORMAL LOW (ref 39.0–52.0)
Hemoglobin: 8.5 g/dL — ABNORMAL LOW (ref 13.0–17.0)
MCH: 28.7 pg (ref 26.0–34.0)
MCHC: 32.8 g/dL (ref 30.0–36.0)
MCV: 87.5 fL (ref 80.0–100.0)
Platelets: 360 10*3/uL (ref 150–400)
RBC: 2.96 MIL/uL — ABNORMAL LOW (ref 4.22–5.81)
RDW: 14 % (ref 11.5–15.5)
WBC: 9.9 10*3/uL (ref 4.0–10.5)
nRBC: 0 % (ref 0.0–0.2)

## 2020-06-24 LAB — PROTIME-INR
INR: 1.9 — ABNORMAL HIGH (ref 0.8–1.2)
Prothrombin Time: 20.8 seconds — ABNORMAL HIGH (ref 11.4–15.2)

## 2020-06-24 MED ORDER — THIAMINE HCL 100 MG PO TABS: 100.0000 mg | ORAL_TABLET | Freq: Every day | ORAL | 0 refills | Status: DC

## 2020-06-24 MED ORDER — DICLOFENAC SODIUM 1 % EX GEL: 2.0000 g | CUTANEOUS | 0 refills | Status: DC | PRN

## 2020-06-24 MED ORDER — ADULT MULTIVITAMIN W/MINERALS CH: 1.0000 | ORAL_TABLET | Freq: Every day | ORAL | 0 refills | Status: DC

## 2020-06-24 MED ORDER — TORSEMIDE 20 MG PO TABS: 20.0000 mg | ORAL_TABLET | Freq: Two times a day (BID) | ORAL | 1 refills | Status: DC

## 2020-06-24 NOTE — Discharge Summary (Signed)
Physician Discharge Summary  Ruben Walker:[id number] DOB: 03-04-1941 DOA: 06/14/2020  PCP: [name], MD  Admit date: 06/14/2020 Discharge date: 06/24/2020  Admitted From: Home Disposition: SNF  Recommendations for Outpatient Follow-up:  1. Follow up with PCP in 1-2 weeks 2. Please obtain BMP/CBC in one week  Discharge Condition: Stable CODE STATUS: DNR Diet recommendation: Low-salt low-fat diet  Brief/Interim Summary: Ruben W Hansonis a 80 Walker medical history significant ofOSA on CPAP; OHS; pacemaker/AICD dependence with h/o cardiac arrest; HTN; HLD; stage 3b CKD; chronic diastolic CHF; and afib on Coumadin presenting with frequent falls.   Patient admitted for acute ambulatory dysfunction in the setting of acute Covid infection and failure to thrive.  Patient also likely had heart failure exacerbation in the setting of poor medication compliance and lifestyle compliance.  These all, culminated in an unstable gait with severe weakness and debility.  Patient had fallen multiple times at home requiring ambulance assistance to even stand up much less to ambulate and was ultimately brought to the hospital.  While here he was treated for his Covid infection without overt hypoxia or pneumonia.  Initially requiring IV fluids for elevated creatinine in the setting of prolonged downtime and mild rhabdomyolysis.  Subsequently requiring diuresis due to heart failure exacerbation.  Patient otherwise continues to improve daily with physical therapy but is not yet back to baseline unsafe for discharge home given his profound weakness.  Continue discharge to skilled nursing facility for ongoing physical therapy rehab, continue home medications as below, will likely need to see PCP as well as cardiology in the next few weeks to titrate his medications further to ensure appropriate diuresis and to control his chronic comorbid conditions.  Discharge Diagnoses:  Principal Problem:    AKI (acute kidney injury) (HCC) Active Problems:   Hyperlipidemia   Essential hypertension   Permanent atrial fibrillation (HCC)   Sleep apnea   ICD (implantable cardioverter-defibrillator) in place   Chronic renal insufficiency, stage III (moderate) (HCC)   Chronic diastolic heart failure (HCC)   Frequent falls   Acute kidney injury (HCC)   Malnutrition of moderate degree    Discharge Instructions  Discharge Instructions    Call MD for:  persistant dizziness or light-headedness   Complete by: As directed    Call MD for:  temperature >100.4   Complete by: As directed    Diet - low sodium heart healthy   Complete by: As directed    Discharge wound care:   Complete by: As directed    Cleanse the coccyx and right buttock wound with soap and water, rinse and pat dry. Apply Xeroform Gauze [place] # 294) to the open areas and cover with foam dressing. Change daily   Increase activity slowly   Complete by: As directed      Allergies as of 06/24/2020   No Known Allergies     Medication List    STOP taking these medications   metolazone 2.5 MG tablet Commonly known as: ZAROXOLYN     TAKE these medications   acetaminophen 325 MG tablet Commonly known as: TYLENOL Take 650 mg by mouth every 6 (six) hours as needed for mild pain or headache.   candesartan 16 MG tablet Commonly known as: ATACAND TAKE 1 TABLET(16 MG) BY MOUTH DAILY What changed: See the new instructions.   carvedilol 3.125 MG tablet Commonly known as: COREG Take 1 tablet (3.125 mg total) by mouth 2 (two) times daily with a meal.   cholecalciferol 1000 units  tablet Commonly known as: VITAMIN D Take 1,000 Units by mouth daily.   diclofenac Sodium 1 % Gel Commonly known as: VOLTAREN Apply 2 g topically as needed (pain). Apply thin layer to knees/feet bilaterally PRN BID   levocetirizine 5 MG tablet Commonly known as: XYZAL TAKE 1 TABLET BY MOUTH EVERY EVENING   multivitamin with minerals Tabs  tablet Take 1 tablet by mouth daily. Start taking on: June 25, 2020   pantoprazole 40 MG tablet Commonly known as: PROTONIX TAKE 1 TABLET(40 MG) BY MOUTH TWICE DAILY What changed: See the new instructions.   simvastatin 20 MG tablet Commonly known as: ZOCOR TAKE 1 TABLET BY MOUTH AT BEDTIME   tamsulosin 0.4 MG Caps capsule Commonly known as: FLOMAX Take 1 capsule (0.4 mg total) by mouth daily.   thiamine 100 MG tablet Take 1 tablet (100 mg total) by mouth daily. Start taking on: June 25, 2020   torsemide 20 MG tablet Commonly known as: DEMADEX Take 1 tablet (20 mg total) by mouth 2 (two) times daily. What changed: See the new instructions.   warfarin 5 MG tablet Commonly known as: COUMADIN Take as directed. If you are unsure how to take this medication, talk to your nurse or doctor. Original instructions: TAKE 1/2 TO 1 TABLET BY MOUTH EVERY DAY AS DIRECTED BY CLINIC What changed: See the new instructions.            Discharge Care Instructions  (From admission, onward)         Start     Ordered   06/24/20 0000  Discharge wound care:       Comments: Cleanse the coccyx and right buttock wound with soap and water, rinse and pat dry. Apply Xeroform Gauze [place] # 294) to the open areas and cover with foam dressing. Change daily   06/24/20 1227          No Known Allergies  Consultations:  None   Procedures/Studies: CT HEAD WO CONTRAST  Result Date: 06/14/2020 CLINICAL DATA:  Head trauma, minor. Additional history provided: Multiple falls recently. EXAM: CT HEAD WITHOUT CONTRAST TECHNIQUE: Contiguous axial images were obtained from the base of the skull through the vertex without intravenous contrast. COMPARISON:  Head CT 05/24/2020. FINDINGS: Brain: Mild cerebral and cerebellar atrophy. Redemonstrated chronic cortical/subcortical right MCA territory infarct within the right frontal, parietal and temporal lobes as well as right insula. There is no acute  intracranial hemorrhage. No acute demarcated cortical infarct. No extra-axial fluid collection. No evidence of intracranial mass. No midline shift. Vascular: No hyperdense vessel.  Atherosclerotic calcifications Skull: Normal. Negative for fracture or focal lesion. Sinuses/Orbits: Visualized orbits show no acute finding. Paranasal sinus disease most notably as follows. Moderate/severe bilateral ethmoid sinus mucosal thickening. Mild bilateral sphenoid and maxillary sinus mucosal thickening. Small left sphenoid sinus mucous retention cyst. IMPRESSION: No evidence of acute intracranial abnormality. Redemonstrated chronic right MCA territory cortical/subcortical infarct. Mild generalized atrophy of the brain. Paranasal sinus disease as described, most notably ethmoidal. Electronically Signed   By: Jackey Loge DO   On: 06/14/2020 11:47   DG Chest Port 1 View  Result Date: 06/17/2020 CLINICAL DATA:  Hypoxia EXAM: PORTABLE CHEST 1 VIEW COMPARISON:  08/14/2016 FINDINGS: Mild bilateral interstitial thickening. No focal consolidation. No pleural effusion or pneumothorax. Stable cardiomegaly. Single lead cardiac pacemaker. No acute osseous abnormality. IMPRESSION: Cardiomegaly with mild pulmonary vascular congestion. Electronically Signed   By: Elige Ko   On: 06/17/2020 16:37      Subjective: No  acute issues or events overnight, denies headache fever chills nausea vomiting diarrhea constipation.  Continues to complain of marked weakness and remains unable to ambulate on his own but is able to at least stand now with only minimal assist which was not possible days ago.   Discharge Exam: Vitals:   06/23/20 1948 06/24/20 0400  BP: (!) 125/34 (!) 131/58  Pulse: 71 69  Resp: 16 18  Temp: 98.5 F (36.9 C) 98.3 F (36.8 C)  SpO2: 90% 98%   Vitals:   06/23/20 0300 06/23/20 1200 06/23/20 1948 06/24/20 0400  BP: (!) 112/49 (!) 110/48 (!) 125/34 (!) 131/58  Pulse: 70 70 71 69  Resp: 16 20 16 18   Temp:  98.3 F (36.8 C) 98.9 F (37.2 C) 98.5 F (36.9 C) 98.3 F (36.8 C)  TempSrc: Oral Oral Oral Oral  SpO2: 97% 94% 90% 98%    General: Pt is alert, awake, not in acute distress Cardiovascular: RRR, S1/S2 +, no rubs, no gallops Respiratory: CTA bilaterally, no wheezing, no rhonchi Abdominal: Soft, NT, ND, bowel sounds + Extremities: 1-2+ pitting edema bilaterally, no cyanosis    The results of significant diagnostics from this hospitalization (including imaging, microbiology, ancillary and laboratory) are listed below for reference.     Microbiology: Recent Results (from the past 240 hour(s))  SARS CORONAVIRUS 2 (TAT 6-24 HRS) Nasopharyngeal Nasopharyngeal Swab     Status: Abnormal   Collection Time: 06/14/20 12:45 PM   Specimen: Nasopharyngeal Swab  Result Value Ref Range Status   SARS Coronavirus 2 POSITIVE (A) NEGATIVE Final    Comment: (NOTE) SARS-CoV-2 target nucleic acids are DETECTED.  The SARS-CoV-2 RNA is generally detectable in upper and lower respiratory specimens during the acute phase of infection. Positive results are indicative of the presence of SARS-CoV-2 RNA. Clinical correlation with patient history and other diagnostic information is  necessary to determine patient infection status. Positive results do not rule out bacterial infection or co-infection with other viruses.  The expected result is Negative.  Fact Sheet for Patients: 06/16/20  Fact Sheet for Healthcare Providers: HairSlick.no  This test is not yet approved or cleared by the [place] FDA and  has been authorized for detection and/or diagnosis of SARS-CoV-2 by FDA under an Emergency Use Authorization (EUA). This EUA will remain  in effect (meaning this test can be used) for the duration of the COVID-19 declaration under Section 564(b)(1) of the Act, 21 U. S.C. section 360bbb-3(b)(1), unless the authorization is terminated  or revoked sooner.   Performed at [facility], [address], [place], [place]      Labs: BNP (last 3 results) No results for input(s): BNP in the last 8760 hours. Basic Metabolic Panel: Recent Labs  Lab 06/20/20 0748 06/21/20 0315 06/22/20 0724 06/23/20 0319 06/24/20 0619  NA 127* 130* 130* 133* 135  K 5.6* 5.5* 5.1 5.2* 4.8  CL 96* 97* 96* 99 101  CO2 22 22 23 25 25   GLUCOSE 92 100* 87 98 92  BUN 77* 81* 75* 71* 56*  CREATININE 1.87* 1.87* 1.67* 1.69* 1.35*  CALCIUM 9.3 9.6 9.6 9.8 10.3   Liver Function Tests: Recent Labs  Lab 06/18/20 0332 06/19/20 0217 06/20/20 0748  AST 38 28 22  ALT 23 21 20   ALKPHOS 57 66 60  BILITOT 0.9 0.7 0.8  PROT 6.1* 6.0* 5.9*  ALBUMIN 2.3* 2.1* 2.0*   No results for input(s): LIPASE, AMYLASE in the last 168 hours. No results for  input(s): AMMONIA in the last 168 hours. CBC: Recent Labs  Lab 06/20/20 0748 06/21/20 0315 06/22/20 0724 06/23/20 0319 06/24/20 0619  WBC 12.6* 9.3 8.2 9.0 9.9  HGB 8.4* 8.5* 8.4* 8.5* 8.5*  HCT 26.5* 26.9* 26.6* 27.2* 25.9*  MCV 88.3 88.8 88.4 88.3 87.5  PLT 354 324 353 382 360   Cardiac Enzymes: Recent Labs  Lab 06/19/20 0217 06/20/20 0748 06/21/20 0315 06/22/20 0724 06/23/20 0319  CKTOTAL 307 142 128 58 42*   BNP: Invalid input(s): POCBNP CBG: No results for input(s): GLUCAP in the last 168 hours. D-Dimer No results for input(s): DDIMER in the last 72 hours. Hgb A1c No results for input(s): HGBA1C in the last 72 hours. Lipid Profile No results for input(s): CHOL, HDL, LDLCALC, TRIG, CHOLHDL, LDLDIRECT in the last 72 hours. Thyroid function studies No results for input(s): TSH, T4TOTAL, T3FREE, THYROIDAB in the last 72 hours.  Invalid input(s): FREET3 Anemia work up No results for input(s): VITAMINB12, FOLATE, FERRITIN, TIBC, IRON, RETICCTPCT in the last 72 hours. Urinalysis    Component Value Date/Time   COLORURINE YELLOW 06/14/2020 1245   APPEARANCEUR  CLEAR 06/14/2020 1245   LABSPEC 1.011 06/14/2020 1245   PHURINE 5.0 06/14/2020 1245   GLUCOSEU NEGATIVE 06/14/2020 1245   HGBUR NEGATIVE 06/14/2020 1245   BILIRUBINUR NEGATIVE 06/14/2020 1245   KETONESUR NEGATIVE 06/14/2020 1245   PROTEINUR NEGATIVE 06/14/2020 1245   NITRITE NEGATIVE 06/14/2020 1245   LEUKOCYTESUR NEGATIVE 06/14/2020 1245   Sepsis Labs Invalid input(s): PROCALCITONIN,  WBC,  LACTICIDVEN Microbiology Recent Results (from the past 240 hour(s))  SARS CORONAVIRUS 2 (TAT 6-24 HRS) Nasopharyngeal Nasopharyngeal Swab     Status: Abnormal   Collection Time: 06/14/20 12:45 PM   Specimen: Nasopharyngeal Swab  Result Value Ref Range Status   SARS Coronavirus 2 POSITIVE (A) NEGATIVE Final    Comment: (NOTE) SARS-CoV-2 target nucleic acids are DETECTED.  The SARS-CoV-2 RNA is generally detectable in upper and lower respiratory specimens during the acute phase of infection. Positive results are indicative of the presence of SARS-CoV-2 RNA. Clinical correlation with patient history and other diagnostic information is  necessary to determine patient infection status. Positive results do not rule out bacterial infection or co-infection with other viruses.  The expected result is Negative.  Fact Sheet for Patients: HairSlick.no  Fact Sheet for Healthcare Providers: quierodirigir.com  This test is not yet approved or cleared by the [place] FDA and  has been authorized for detection and/or diagnosis of SARS-CoV-2 by FDA under an Emergency Use Authorization (EUA). This EUA will remain  in effect (meaning this test can be used) for the duration of the COVID-19 declaration under Section 564(b)(1) of the Act, 21 U. S.C. section 360bbb-3(b)(1), unless the authorization is terminated or revoked sooner.   Performed at [facility], [address] [place], [place] [address]      Time coordinating discharge:  Over 30 minutes  SIGNED:   Azucena Fallen, DO [facility] 06/24/2020, 12:28 PM Pager   If 7PM-7AM, please contact night-coverage [email or web address]

## 2020-06-24 NOTE — TOC Transition Note (Signed)
Transition of Care [facility]) - CM/SW Discharge Note   Patient Details  Name: LOUKAS ANTONSON MRN: [id number] Date of Birth: May 16, 1941  Transition of Care [facility]) CM/SW Contact:  Carley Hammed, LCSWA Phone Number: 06/24/2020, 1:26 PM   Clinical Narrative:    Nurse to call report to [phone] Rm# [phone]   Final next level of care: Skilled Nursing Facility Barriers to Discharge: Barriers Resolved   Patient Goals and CMS Choice Patient states their goals for this hospitalization and ongoing recovery are:: Patient plans to return home at discharge CMS Medicare.gov Compare Post Acute Care list provided to:: Other (Comment Required) (Not provided as patient declined SNF) Choice offered to / list presented to : Patient (Talked with patient re: SNF, but he is declining placement at discharge)  Discharge Placement              Patient chooses bed at:  [facility]) Patient to be transferred to facility by: [facility] Name of family member notified: Suzzanne Cloud Patient and family notified of of transfer: 06/24/20  Discharge Plan and Services In-house Referral: Clinical Social Work                                   Social Determinants of Health (SDOH) Interventions     Readmission Risk Interventions No flowsheet data found.

## 2020-06-24 NOTE — Progress Notes (Signed)
ANTICOAGULATION CONSULT NOTE - Follow-Up Consult  Pharmacy Consult for warfarin Indication: atrial fibrillation  No Known Allergies  Vital Signs: Temp: 98.3 F (36.8 C) (02/04 0400) Temp Source: Oral (02/04 0400) BP: 131/58 (02/04 0400) Pulse Rate: 69 (02/04 0400)  Labs: Recent Labs    06/22/20 0724 06/23/20 0319 06/24/20 0619  HGB 8.4* 8.5* 8.5*  HCT 26.6* 27.2* 25.9*  PLT 353 382 360  LABPROT 21.0* 20.1* 20.8*  INR 1.9* 1.8* 1.9*  CREATININE 1.67* 1.69* 1.35*  CKTOTAL 58 42*  --     CrCl cannot be calculated (Unknown ideal weight.).  Assessment: 80 year old male presented to ED due to frequent falls. PMH includes pacemaker/ICD with hx of cardiac arrest, HTN, HLD, CKD, HF, afib on warfarin. INR supratherapeutic on admission.  Today INR 1.9 after 5mg  yesterday, still subtherapeutic but up trending. Hgb stable. Plt wnl. No reported bleeding. Patient is not interested in a DOAC.  PTA warfarin 5 mg MWF and 2.5 mg all other days. Last dose 1/24 at 1800. Warfarin managed by 2/24, last visit on 04/29/20 INR was supratherapeutic at 3.4 on current regimen.  Goal of Therapy:  INR 2-3 (prefer to keep low due to fall risk) Monitor platelets by anticoagulation protocol: Yes   Plan:  Warfarin 3mg  daily  Monitor QTue/Fri INR and weekly CBC Will continue to monitor for any signs/symptoms of bleeding .     Thank you for allowing pharmacy to be a part of this patient's care.  14/10/21, PharmD, BCPS, BCCP Clinical Pharmacist  Please check AMION for all [facility] Pharmacy phone numbers After 10:00 PM, call Main Pharmacy [phone]

## 2020-06-24 NOTE — Plan of Care (Signed)
  Problem: Education: Goal: Knowledge of General Education information will improve Description: Including pain rating scale, medication(s)/side effects and non-pharmacologic comfort measures Outcome: Progressing   Problem: Health Behavior/Discharge Planning: Goal: Ability to manage health-related needs will improve Outcome: Progressing   Problem: Clinical Measurements: Goal: Will remain free from infection Outcome: Progressing   

## 2020-06-24 NOTE — Progress Notes (Addendum)
Physical Therapy Treatment Patient Details Name: Ruben Walker MRN: [id number] DOB: 05/14/1941 Today's Date: 06/24/2020    History of Present Illness Ruben Walker is a 80 y.o. male with PMHx: of OSA on CPAP; OHS; pacemaker/AICD dependence with h/o cardiac arrest; HTN; HLD; stage 3b CKD; chronic diastolic CHF; and afib on Coumadin presenting with frequent falls, AKI, and COVID-19.    PT Comments    Pt supine in bed on arrival.  He reports stool incontinence.  Performed rolling to R and L side with emphasis on hand placement to assist in rolling.  Pt performed supine to sit and lateral scoot with max A +1.  Pt continues to benefit from skilled rehab in a post acute setting to maximize functional gains and improve strength.      Follow Up Recommendations  SNF;Supervision/Assistance - 24 hour     Equipment Recommendations  Other (comment) (defer to post acute rehab.)    Recommendations for Other Services       Precautions / Restrictions Precautions Precautions: Fall Precaution Comments: Covid+ Restrictions Weight Bearing Restrictions: No    Mobility  Bed Mobility Overal bed mobility: Needs Assistance Bed Mobility: Rolling;Sidelying to Sit Rolling: Mod assist   Supine to sit: Max assist     General bed mobility comments: Pt performed rolling to R and L side for pericare and pad placement to prepare for slide board transfer from bed to drop arm recliner.  Transfers Overall transfer level: Needs assistance Equipment used: Sliding board Transfers: Lateral/Scoot Transfers          Lateral/Scoot Transfers: Max assist General transfer comment: Max assistance with use of bed pad and assistance for hand placement on bed, chair and arm rest of chair to assist with transition across slide board.  Pt continues to required facilitation to weight shift forward and utilize UEs to move from surface to surface.  Ambulation/Gait                 Stairs              Wheelchair Mobility    Modified Rankin (Stroke Patients Only)       Balance Overall balance assessment: Needs assistance Sitting-balance support: Feet supported;Bilateral upper extremity supported Sitting balance-Leahy Scale: Poor Sitting balance - Comments: no lateral lean sitting this session.  He continues to lean posterior with posterior pelvic tilt.                                    Cognition Arousal/Alertness: Awake/alert Behavior During Therapy: Flat affect Overall Cognitive Status: No family/caregiver present to determine baseline cognitive functioning                                 General Comments: decreased awareness of deficits, self limiting      Exercises      General Comments        Pertinent Vitals/Pain Pain Assessment: Faces Faces Pain Scale: Hurts even more Pain Location: B LEs with movement Pain Descriptors / Indicators: Grimacing;Guarding Pain Intervention(s): Monitored during session;Repositioned    Home Living                      Prior Function            PT Goals (current goals can now be found in the care plan section)  Acute Rehab PT Goals Patient Stated Goal: to go home Potential to Achieve Goals: Fair Progress towards PT goals: Progressing toward goals    Frequency    Min 2X/week      PT Plan Current plan remains appropriate    Co-evaluation              AM-PAC PT "6 Clicks" Mobility   Outcome Measure  Help needed turning from your back to your side while in a flat bed without using bedrails?: A Lot Help needed moving from lying on your back to sitting on the side of a flat bed without using bedrails?: A Lot Help needed moving to and from a bed to a chair (including a wheelchair)?: A Lot Help needed standing up from a chair using your arms (e.g., wheelchair or bedside chair)?: Total Help needed to walk in hospital room?: Total Help needed climbing 3-5 steps with a  railing? : Total 6 Click Score: 9    End of Session   Activity Tolerance: Patient limited by pain (limited due to fear of falling/self limting behavior.) Patient left: with call bell/phone within reach;in chair;with chair alarm set Nurse Communication: Mobility status PT Visit Diagnosis: Unsteadiness on feet (R26.81);Muscle weakness (generalized) (M62.81);Difficulty in walking, not elsewhere classified (R26.2)     Time: 5056-9794 PT Time Calculation (min) (ACUTE ONLY): 33 min  Charges:  $Therapeutic Activity: 23-37 mins                     Bonney Leitz , PTA Acute Rehabilitation Services Pager [phone] Office [phone]     Bralen Wiltgen Artis Delay 06/24/2020, 1:14 PM

## 2020-06-24 NOTE — Progress Notes (Signed)
Called & left messages for [facility] for pt report; nobody called back as of this time.

## 2020-06-25 DIAGNOSIS — N179 Acute kidney failure, unspecified: Secondary | ICD-10-CM | POA: Diagnosis not present

## 2020-06-25 NOTE — Progress Notes (Signed)
02/04 2129 Pt transported to [facility] by [place]. Pt given tylenol for pain. Pt stable. Pt had  belongings.

## 2020-06-27 ENCOUNTER — Telehealth: Payer: Self-pay

## 2020-06-27 NOTE — Telephone Encounter (Signed)
lmom for inr appt

## 2020-06-28 DIAGNOSIS — R5381 Other malaise: Secondary | ICD-10-CM | POA: Diagnosis not present

## 2020-06-28 DIAGNOSIS — E559 Vitamin D deficiency, unspecified: Secondary | ICD-10-CM | POA: Diagnosis not present

## 2020-06-28 DIAGNOSIS — I4891 Unspecified atrial fibrillation: Secondary | ICD-10-CM | POA: Diagnosis not present

## 2020-06-28 DIAGNOSIS — I5032 Chronic diastolic (congestive) heart failure: Secondary | ICD-10-CM | POA: Diagnosis not present

## 2020-06-28 DIAGNOSIS — I10 Essential (primary) hypertension: Secondary | ICD-10-CM | POA: Diagnosis not present

## 2020-06-28 DIAGNOSIS — E785 Hyperlipidemia, unspecified: Secondary | ICD-10-CM | POA: Diagnosis not present

## 2020-06-28 DIAGNOSIS — R627 Adult failure to thrive: Secondary | ICD-10-CM | POA: Diagnosis not present

## 2020-06-28 DIAGNOSIS — K219 Gastro-esophageal reflux disease without esophagitis: Secondary | ICD-10-CM | POA: Diagnosis not present

## 2020-06-28 DIAGNOSIS — G4733 Obstructive sleep apnea (adult) (pediatric): Secondary | ICD-10-CM | POA: Diagnosis not present

## 2020-06-28 DIAGNOSIS — U071 COVID-19: Secondary | ICD-10-CM | POA: Diagnosis not present

## 2020-06-29 DIAGNOSIS — L89313 Pressure ulcer of right buttock, stage 3: Secondary | ICD-10-CM | POA: Diagnosis not present

## 2020-06-29 DIAGNOSIS — L89150 Pressure ulcer of sacral region, unstageable: Secondary | ICD-10-CM | POA: Diagnosis not present

## 2020-06-29 DIAGNOSIS — L89312 Pressure ulcer of right buttock, stage 2: Secondary | ICD-10-CM | POA: Diagnosis not present

## 2020-06-29 DIAGNOSIS — R159 Full incontinence of feces: Secondary | ICD-10-CM | POA: Diagnosis not present

## 2020-06-30 DIAGNOSIS — I5032 Chronic diastolic (congestive) heart failure: Secondary | ICD-10-CM | POA: Diagnosis not present

## 2020-06-30 DIAGNOSIS — K219 Gastro-esophageal reflux disease without esophagitis: Secondary | ICD-10-CM | POA: Diagnosis not present

## 2020-06-30 DIAGNOSIS — G4733 Obstructive sleep apnea (adult) (pediatric): Secondary | ICD-10-CM | POA: Diagnosis not present

## 2020-06-30 DIAGNOSIS — I4891 Unspecified atrial fibrillation: Secondary | ICD-10-CM | POA: Diagnosis not present

## 2020-06-30 DIAGNOSIS — R627 Adult failure to thrive: Secondary | ICD-10-CM | POA: Diagnosis not present

## 2020-06-30 DIAGNOSIS — I10 Essential (primary) hypertension: Secondary | ICD-10-CM | POA: Diagnosis not present

## 2020-06-30 DIAGNOSIS — E785 Hyperlipidemia, unspecified: Secondary | ICD-10-CM | POA: Diagnosis not present

## 2020-06-30 DIAGNOSIS — U071 COVID-19: Secondary | ICD-10-CM | POA: Diagnosis not present

## 2020-06-30 DIAGNOSIS — R5381 Other malaise: Secondary | ICD-10-CM | POA: Diagnosis not present

## 2020-07-01 ENCOUNTER — Other Ambulatory Visit: Payer: Self-pay | Admitting: Cardiovascular Disease

## 2020-07-05 ENCOUNTER — Telehealth: Payer: Self-pay

## 2020-07-05 NOTE — Telephone Encounter (Signed)
lmom for overdue inr 

## 2020-07-06 DIAGNOSIS — L89313 Pressure ulcer of right buttock, stage 3: Secondary | ICD-10-CM | POA: Diagnosis not present

## 2020-07-06 DIAGNOSIS — L89312 Pressure ulcer of right buttock, stage 2: Secondary | ICD-10-CM | POA: Diagnosis not present

## 2020-07-06 DIAGNOSIS — R159 Full incontinence of feces: Secondary | ICD-10-CM | POA: Diagnosis not present

## 2020-07-06 DIAGNOSIS — L89150 Pressure ulcer of sacral region, unstageable: Secondary | ICD-10-CM | POA: Diagnosis not present

## 2020-07-12 ENCOUNTER — Telehealth: Payer: Self-pay

## 2020-07-12 DIAGNOSIS — I4891 Unspecified atrial fibrillation: Secondary | ICD-10-CM | POA: Diagnosis not present

## 2020-07-12 DIAGNOSIS — R627 Adult failure to thrive: Secondary | ICD-10-CM | POA: Diagnosis not present

## 2020-07-12 DIAGNOSIS — U071 COVID-19: Secondary | ICD-10-CM | POA: Diagnosis not present

## 2020-07-12 DIAGNOSIS — E871 Hypo-osmolality and hyponatremia: Secondary | ICD-10-CM | POA: Diagnosis not present

## 2020-07-12 DIAGNOSIS — R5381 Other malaise: Secondary | ICD-10-CM | POA: Diagnosis not present

## 2020-07-12 DIAGNOSIS — G4733 Obstructive sleep apnea (adult) (pediatric): Secondary | ICD-10-CM | POA: Diagnosis not present

## 2020-07-12 DIAGNOSIS — D649 Anemia, unspecified: Secondary | ICD-10-CM | POA: Diagnosis not present

## 2020-07-12 DIAGNOSIS — I10 Essential (primary) hypertension: Secondary | ICD-10-CM | POA: Diagnosis not present

## 2020-07-12 DIAGNOSIS — E785 Hyperlipidemia, unspecified: Secondary | ICD-10-CM | POA: Diagnosis not present

## 2020-07-12 NOTE — Telephone Encounter (Signed)
3rd and final lmom for overdue inr

## 2020-07-13 DIAGNOSIS — L89312 Pressure ulcer of right buttock, stage 2: Secondary | ICD-10-CM | POA: Diagnosis not present

## 2020-07-13 DIAGNOSIS — R159 Full incontinence of feces: Secondary | ICD-10-CM | POA: Diagnosis not present

## 2020-07-13 DIAGNOSIS — L89313 Pressure ulcer of right buttock, stage 3: Secondary | ICD-10-CM | POA: Diagnosis not present

## 2020-07-13 DIAGNOSIS — L89150 Pressure ulcer of sacral region, unstageable: Secondary | ICD-10-CM | POA: Diagnosis not present

## 2020-07-19 ENCOUNTER — Other Ambulatory Visit: Payer: Self-pay

## 2020-07-19 ENCOUNTER — Inpatient Hospital Stay (HOSPITAL_COMMUNITY)
Admission: EM | Admit: 2020-07-19 | Discharge: 2020-07-26 | DRG: 698 | Disposition: A | Discharge status: Skilled Nursing Facility | Payer: Medicare Other | Source: Skilled Nursing Facility | Attending: Internal Medicine | Admitting: Internal Medicine

## 2020-07-19 ENCOUNTER — Emergency Department (HOSPITAL_COMMUNITY): Payer: Medicare Other

## 2020-07-19 ENCOUNTER — Encounter (HOSPITAL_COMMUNITY): Payer: Self-pay | Admitting: Emergency Medicine

## 2020-07-19 DIAGNOSIS — E43 Unspecified severe protein-calorie malnutrition: Secondary | ICD-10-CM | POA: Diagnosis not present

## 2020-07-19 DIAGNOSIS — I5032 Chronic diastolic (congestive) heart failure: Secondary | ICD-10-CM | POA: Diagnosis not present

## 2020-07-19 DIAGNOSIS — Z66 Do not resuscitate: Secondary | ICD-10-CM | POA: Diagnosis present

## 2020-07-19 DIAGNOSIS — L89322 Pressure ulcer of left buttock, stage 2: Secondary | ICD-10-CM | POA: Diagnosis present

## 2020-07-19 DIAGNOSIS — Z9181 History of falling: Secondary | ICD-10-CM

## 2020-07-19 DIAGNOSIS — D62 Acute posthemorrhagic anemia: Secondary | ICD-10-CM | POA: Diagnosis present

## 2020-07-19 DIAGNOSIS — A412 Sepsis due to unspecified staphylococcus: Secondary | ICD-10-CM | POA: Diagnosis present

## 2020-07-19 DIAGNOSIS — S40022A Contusion of left upper arm, initial encounter: Secondary | ICD-10-CM | POA: Diagnosis present

## 2020-07-19 DIAGNOSIS — Z20822 Contact with and (suspected) exposure to covid-19: Secondary | ICD-10-CM | POA: Diagnosis present

## 2020-07-19 DIAGNOSIS — E871 Hypo-osmolality and hyponatremia: Secondary | ICD-10-CM | POA: Diagnosis not present

## 2020-07-19 DIAGNOSIS — Z8781 Personal history of (healed) traumatic fracture: Secondary | ICD-10-CM

## 2020-07-19 DIAGNOSIS — Z8249 Family history of ischemic heart disease and other diseases of the circulatory system: Secondary | ICD-10-CM

## 2020-07-19 DIAGNOSIS — Z8616 Personal history of COVID-19: Secondary | ICD-10-CM | POA: Diagnosis not present

## 2020-07-19 DIAGNOSIS — A419 Sepsis, unspecified organism: Secondary | ICD-10-CM | POA: Diagnosis not present

## 2020-07-19 DIAGNOSIS — I517 Cardiomegaly: Secondary | ICD-10-CM | POA: Diagnosis not present

## 2020-07-19 DIAGNOSIS — I130 Hypertensive heart and chronic kidney disease with heart failure and stage 1 through stage 4 chronic kidney disease, or unspecified chronic kidney disease: Secondary | ICD-10-CM | POA: Diagnosis present

## 2020-07-19 DIAGNOSIS — Z95810 Presence of automatic (implantable) cardiac defibrillator: Secondary | ICD-10-CM

## 2020-07-19 DIAGNOSIS — N189 Chronic kidney disease, unspecified: Secondary | ICD-10-CM

## 2020-07-19 DIAGNOSIS — J9621 Acute and chronic respiratory failure with hypoxia: Secondary | ICD-10-CM | POA: Diagnosis present

## 2020-07-19 DIAGNOSIS — J479 Bronchiectasis, uncomplicated: Secondary | ICD-10-CM | POA: Diagnosis present

## 2020-07-19 DIAGNOSIS — Z7901 Long term (current) use of anticoagulants: Secondary | ICD-10-CM

## 2020-07-19 DIAGNOSIS — K219 Gastro-esophageal reflux disease without esophagitis: Secondary | ICD-10-CM | POA: Diagnosis not present

## 2020-07-19 DIAGNOSIS — I442 Atrioventricular block, complete: Secondary | ICD-10-CM | POA: Diagnosis present

## 2020-07-19 DIAGNOSIS — L89312 Pressure ulcer of right buttock, stage 2: Secondary | ICD-10-CM | POA: Diagnosis present

## 2020-07-19 DIAGNOSIS — N390 Urinary tract infection, site not specified: Secondary | ICD-10-CM | POA: Diagnosis not present

## 2020-07-19 DIAGNOSIS — L03818 Cellulitis of other sites: Secondary | ICD-10-CM | POA: Diagnosis present

## 2020-07-19 DIAGNOSIS — R5381 Other malaise: Secondary | ICD-10-CM | POA: Diagnosis not present

## 2020-07-19 DIAGNOSIS — T83511A Infection and inflammatory reaction due to indwelling urethral catheter, initial encounter: Principal | ICD-10-CM | POA: Diagnosis present

## 2020-07-19 DIAGNOSIS — G4730 Sleep apnea, unspecified: Secondary | ICD-10-CM | POA: Diagnosis not present

## 2020-07-19 DIAGNOSIS — R059 Cough, unspecified: Secondary | ICD-10-CM | POA: Diagnosis not present

## 2020-07-19 DIAGNOSIS — I4821 Permanent atrial fibrillation: Secondary | ICD-10-CM | POA: Diagnosis present

## 2020-07-19 DIAGNOSIS — Y846 Urinary catheterization as the cause of abnormal reaction of the patient, or of later complication, without mention of misadventure at the time of the procedure: Secondary | ICD-10-CM | POA: Diagnosis present

## 2020-07-19 DIAGNOSIS — L89626 Pressure-induced deep tissue damage of left heel: Secondary | ICD-10-CM | POA: Diagnosis present

## 2020-07-19 DIAGNOSIS — R195 Other fecal abnormalities: Secondary | ICD-10-CM | POA: Diagnosis not present

## 2020-07-19 DIAGNOSIS — L03319 Cellulitis of trunk, unspecified: Secondary | ICD-10-CM | POA: Diagnosis not present

## 2020-07-19 DIAGNOSIS — J9600 Acute respiratory failure, unspecified whether with hypoxia or hypercapnia: Secondary | ICD-10-CM | POA: Diagnosis not present

## 2020-07-19 DIAGNOSIS — M79622 Pain in left upper arm: Secondary | ICD-10-CM | POA: Diagnosis not present

## 2020-07-19 DIAGNOSIS — E559 Vitamin D deficiency, unspecified: Secondary | ICD-10-CM | POA: Diagnosis not present

## 2020-07-19 DIAGNOSIS — E662 Morbid (severe) obesity with alveolar hypoventilation: Secondary | ICD-10-CM | POA: Diagnosis present

## 2020-07-19 DIAGNOSIS — G8929 Other chronic pain: Secondary | ICD-10-CM | POA: Diagnosis present

## 2020-07-19 DIAGNOSIS — D649 Anemia, unspecified: Secondary | ICD-10-CM

## 2020-07-19 DIAGNOSIS — G4733 Obstructive sleep apnea (adult) (pediatric): Secondary | ICD-10-CM | POA: Diagnosis present

## 2020-07-19 DIAGNOSIS — Z7401 Bed confinement status: Secondary | ICD-10-CM | POA: Diagnosis not present

## 2020-07-19 DIAGNOSIS — E785 Hyperlipidemia, unspecified: Secondary | ICD-10-CM | POA: Diagnosis not present

## 2020-07-19 DIAGNOSIS — R0902 Hypoxemia: Secondary | ICD-10-CM | POA: Diagnosis not present

## 2020-07-19 DIAGNOSIS — I447 Left bundle-branch block, unspecified: Secondary | ICD-10-CM | POA: Diagnosis present

## 2020-07-19 DIAGNOSIS — M1990 Unspecified osteoarthritis, unspecified site: Secondary | ICD-10-CM | POA: Diagnosis not present

## 2020-07-19 DIAGNOSIS — I071 Rheumatic tricuspid insufficiency: Secondary | ICD-10-CM | POA: Diagnosis present

## 2020-07-19 DIAGNOSIS — L8995 Pressure ulcer of unspecified site, unstageable: Secondary | ICD-10-CM | POA: Diagnosis not present

## 2020-07-19 DIAGNOSIS — X58XXXA Exposure to other specified factors, initial encounter: Secondary | ICD-10-CM | POA: Diagnosis present

## 2020-07-19 DIAGNOSIS — Z8674 Personal history of sudden cardiac arrest: Secondary | ICD-10-CM | POA: Diagnosis not present

## 2020-07-19 DIAGNOSIS — Z79899 Other long term (current) drug therapy: Secondary | ICD-10-CM

## 2020-07-19 DIAGNOSIS — R0689 Other abnormalities of breathing: Secondary | ICD-10-CM | POA: Diagnosis not present

## 2020-07-19 DIAGNOSIS — L8990 Pressure ulcer of unspecified site, unspecified stage: Secondary | ICD-10-CM | POA: Diagnosis present

## 2020-07-19 DIAGNOSIS — Z9111 Patient's noncompliance with dietary regimen: Secondary | ICD-10-CM

## 2020-07-19 DIAGNOSIS — N179 Acute kidney failure, unspecified: Secondary | ICD-10-CM | POA: Diagnosis not present

## 2020-07-19 DIAGNOSIS — L89154 Pressure ulcer of sacral region, stage 4: Secondary | ICD-10-CM | POA: Diagnosis not present

## 2020-07-19 DIAGNOSIS — Z9981 Dependence on supplemental oxygen: Secondary | ICD-10-CM

## 2020-07-19 DIAGNOSIS — N1832 Chronic kidney disease, stage 3b: Secondary | ICD-10-CM | POA: Diagnosis present

## 2020-07-19 DIAGNOSIS — D638 Anemia in other chronic diseases classified elsewhere: Secondary | ICD-10-CM | POA: Diagnosis present

## 2020-07-19 DIAGNOSIS — M6281 Muscle weakness (generalized): Secondary | ICD-10-CM | POA: Diagnosis not present

## 2020-07-19 DIAGNOSIS — Z950 Presence of cardiac pacemaker: Secondary | ICD-10-CM

## 2020-07-19 DIAGNOSIS — B952 Enterococcus as the cause of diseases classified elsewhere: Secondary | ICD-10-CM | POA: Diagnosis present

## 2020-07-19 DIAGNOSIS — D631 Anemia in chronic kidney disease: Secondary | ICD-10-CM | POA: Diagnosis present

## 2020-07-19 DIAGNOSIS — M2550 Pain in unspecified joint: Secondary | ICD-10-CM | POA: Diagnosis not present

## 2020-07-19 DIAGNOSIS — I959 Hypotension, unspecified: Secondary | ICD-10-CM | POA: Diagnosis not present

## 2020-07-19 DIAGNOSIS — M7989 Other specified soft tissue disorders: Secondary | ICD-10-CM | POA: Diagnosis not present

## 2020-07-19 DIAGNOSIS — L89610 Pressure ulcer of right heel, unstageable: Secondary | ICD-10-CM | POA: Diagnosis present

## 2020-07-19 DIAGNOSIS — R410 Disorientation, unspecified: Secondary | ICD-10-CM | POA: Diagnosis not present

## 2020-07-19 DIAGNOSIS — I10 Essential (primary) hypertension: Secondary | ICD-10-CM | POA: Diagnosis not present

## 2020-07-19 LAB — URINALYSIS, ROUTINE W REFLEX MICROSCOPIC
Bilirubin Urine: NEGATIVE
Glucose, UA: NEGATIVE mg/dL
Ketones, ur: NEGATIVE mg/dL
Nitrite: NEGATIVE
Protein, ur: NEGATIVE mg/dL
Specific Gravity, Urine: 1.01 (ref 1.005–1.030)
pH: 5 (ref 5.0–8.0)

## 2020-07-19 LAB — COMPREHENSIVE METABOLIC PANEL
ALT: 21 U/L (ref 0–44)
AST: 27 U/L (ref 15–41)
Albumin: 1.8 g/dL — ABNORMAL LOW (ref 3.5–5.0)
Alkaline Phosphatase: 96 U/L (ref 38–126)
Anion gap: 13 (ref 5–15)
BUN: 83 mg/dL — ABNORMAL HIGH (ref 8–23)
CO2: 18 mmol/L — ABNORMAL LOW (ref 22–32)
Calcium: 9.6 mg/dL (ref 8.9–10.3)
Chloride: 95 mmol/L — ABNORMAL LOW (ref 98–111)
Creatinine, Ser: 1.89 mg/dL — ABNORMAL HIGH (ref 0.61–1.24)
GFR, Estimated: 36 mL/min — ABNORMAL LOW (ref >60)
Glucose, Bld: 102 mg/dL — ABNORMAL HIGH (ref 70–99)
Potassium: 5.1 mmol/L (ref 3.5–5.1)
Sodium: 126 mmol/L — ABNORMAL LOW (ref 135–145)
Total Bilirubin: 0.9 mg/dL (ref 0.3–1.2)
Total Protein: 6.3 g/dL — ABNORMAL LOW (ref 6.5–8.1)

## 2020-07-19 LAB — PREPARE RBC (CROSSMATCH)

## 2020-07-19 LAB — SODIUM, URINE, RANDOM: Sodium, Ur: <10 mmol/L

## 2020-07-19 LAB — LACTIC ACID, PLASMA
Lactic Acid, Venous: 0.9 mmol/L (ref 0.5–1.9)
Lactic Acid, Venous: 1.6 mmol/L (ref 0.5–1.9)

## 2020-07-19 LAB — CBC WITH DIFFERENTIAL/PLATELET
Abs Immature Granulocytes: 0.2 10*3/uL — ABNORMAL HIGH (ref 0.00–0.07)
Basophils Absolute: 0 10*3/uL (ref 0.0–0.1)
Basophils Relative: 0 %
Eosinophils Absolute: 0.3 10*3/uL (ref 0.0–0.5)
Eosinophils Relative: 2 %
HCT: 24.6 % — ABNORMAL LOW (ref 39.0–52.0)
Hemoglobin: 7.6 g/dL — ABNORMAL LOW (ref 13.0–17.0)
Immature Granulocytes: 1 %
Lymphocytes Relative: 7 %
Lymphs Abs: 1.1 10*3/uL (ref 0.7–4.0)
MCH: 26.1 pg (ref 26.0–34.0)
MCHC: 30.9 g/dL (ref 30.0–36.0)
MCV: 84.5 fL (ref 80.0–100.0)
Monocytes Absolute: 0.9 10*3/uL (ref 0.1–1.0)
Monocytes Relative: 5 %
Neutro Abs: 13.5 10*3/uL — ABNORMAL HIGH (ref 1.7–7.7)
Neutrophils Relative %: 85 %
Platelets: 436 10*3/uL — ABNORMAL HIGH (ref 150–400)
RBC: 2.91 MIL/uL — ABNORMAL LOW (ref 4.22–5.81)
RDW: 14.8 % (ref 11.5–15.5)
WBC: 16 10*3/uL — ABNORMAL HIGH (ref 4.0–10.5)
nRBC: 0 % (ref 0.0–0.2)

## 2020-07-19 LAB — CREATININE, URINE, RANDOM: Creatinine, Urine: 63.72 mg/dL

## 2020-07-19 LAB — BRAIN NATRIURETIC PEPTIDE: B Natriuretic Peptide: 271.9 pg/mL — ABNORMAL HIGH (ref 0.0–100.0)

## 2020-07-19 LAB — SEDIMENTATION RATE: Sed Rate: 120 mm/hr — ABNORMAL HIGH (ref 0–16)

## 2020-07-19 LAB — ABO/RH: ABO/RH(D): O POS

## 2020-07-19 LAB — POC OCCULT BLOOD, ED: Fecal Occult Bld: POSITIVE — AB

## 2020-07-19 LAB — C-REACTIVE PROTEIN: CRP: 16.1 mg/dL — ABNORMAL HIGH (ref <1.0)

## 2020-07-19 LAB — PROTIME-INR
INR: 2.3 — ABNORMAL HIGH (ref 0.8–1.2)
Prothrombin Time: 24.3 seconds — ABNORMAL HIGH (ref 11.4–15.2)

## 2020-07-19 LAB — PREALBUMIN: Prealbumin: 5 mg/dL — ABNORMAL LOW (ref 18–38)

## 2020-07-19 MED ORDER — ALBUTEROL SULFATE (2.5 MG/3ML) 0.083% IN NEBU: 2.5000 mg | INHALATION_SOLUTION | Freq: Four times a day (QID) | RESPIRATORY_TRACT | Status: DC | PRN

## 2020-07-19 MED ORDER — VANCOMYCIN HCL 1750 MG/350ML IV SOLN
1750.0000 mg | Freq: Once | INTRAVENOUS | Status: AC
  Administered 2020-07-19: 1750 mg via INTRAVENOUS
  Filled 2020-07-19: qty 350

## 2020-07-19 MED ORDER — SODIUM CHLORIDE 0.9% FLUSH: 3.0000 mL | Freq: Two times a day (BID) | INTRAVENOUS | Status: DC

## 2020-07-19 MED ORDER — VANCOMYCIN VARIABLE DOSE PER UNSTABLE RENAL FUNCTION (PHARMACIST DOSING): Status: DC

## 2020-07-19 MED ORDER — SODIUM CHLORIDE 0.9% IV SOLUTION
Freq: Once | INTRAVENOUS | Status: AC
  Administered 2020-07-21: 10 mL/h via INTRAVENOUS

## 2020-07-19 MED ORDER — ENSURE ENLIVE PO LIQD
237.0000 mL | Freq: Three times a day (TID) | ORAL | Status: DC
  Administered 2020-07-19 – 2020-07-26 (×19): 237 mL via ORAL
  Filled 2020-07-19: qty 237

## 2020-07-19 MED ORDER — BOOST / RESOURCE BREEZE PO LIQD CUSTOM
1.0000 | Freq: Three times a day (TID) | ORAL | Status: DC
Start: 2020-07-19 — End: 2020-07-19

## 2020-07-19 MED ORDER — ACETAMINOPHEN 650 MG RE SUPP: 650.0000 mg | Freq: Four times a day (QID) | RECTAL | Status: DC | PRN

## 2020-07-19 MED ORDER — SODIUM CHLORIDE 0.9% FLUSH
10.0000 mL | Freq: Two times a day (BID) | INTRAVENOUS | Status: DC
  Administered 2020-07-24: 10 mL

## 2020-07-19 MED ORDER — SODIUM CHLORIDE 0.9 % IV BOLUS
500.0000 mL | Freq: Once | INTRAVENOUS | Status: AC
  Administered 2020-07-19: 500 mL via INTRAVENOUS

## 2020-07-19 MED ORDER — ACETAMINOPHEN 325 MG PO TABS
650.0000 mg | ORAL_TABLET | Freq: Four times a day (QID) | ORAL | Status: DC | PRN
  Administered 2020-07-20 – 2020-07-25 (×5): 650 mg via ORAL
  Filled 2020-07-19 (×5): qty 2

## 2020-07-19 MED ORDER — VITAMIN K1 10 MG/ML IJ SOLN
1.0000 mg | Freq: Once | INTRAVENOUS | Status: AC
  Administered 2020-07-20: 1 mg via INTRAVENOUS
  Filled 2020-07-19: qty 0.1

## 2020-07-19 MED ORDER — SODIUM CHLORIDE 0.9 % IV SOLN
1.0000 g | INTRAVENOUS | Status: DC
  Administered 2020-07-19 – 2020-07-22 (×4): 1 g via INTRAVENOUS
  Filled 2020-07-19 (×3): qty 10
  Filled 2020-07-19: qty 1
  Filled 2020-07-19 (×2): qty 10

## 2020-07-19 MED ORDER — SODIUM CHLORIDE 0.9 % IV SOLN: INTRAVENOUS | Status: DC

## 2020-07-19 MED ORDER — SODIUM CHLORIDE 0.9% FLUSH: 10.0000 mL | INTRAVENOUS | Status: DC | PRN

## 2020-07-19 NOTE — ED Notes (Signed)
Pt put in teletracking system to be transported upstairs

## 2020-07-19 NOTE — ED Notes (Signed)
IV team at bedside to place a midline.

## 2020-07-19 NOTE — Progress Notes (Signed)
Pharmacy Antibiotic Note  Ruben Walker is a 80 y.o. male admitted on 07/19/2020 with sepsis.  Pharmacy has been consulted for vancomycin dosing. Patient in AKI with Scr 1.89.  Plan: Vancomycin 1750 mg IV x1 Monitor renal function for further vancomycin dosing Monitor C&S, clinical status, VT as needed  Temp (24hrs), Avg:97.5 F (36.4 C), Min:97.5 F (36.4 C), Max:97.5 F (36.4 C)  Recent Labs  Lab 07/19/20 1254  WBC 16.0*  CREATININE 1.89*    CrCl cannot be calculated (Unknown ideal weight.).    No Known Allergies  Antimicrobials this admission: Vancomycin 3/1> Ceftriaxone 3/1>  Dose adjustments this admission: N/A  Thank you for allowing pharmacy to be a part of this patient's care.  Kinnie Feil, PharmD PGY1 Acute Care Pharmacy Resident 07/19/2020 4:36 PM  Please check [email or web address] for unit specific pharmacy phone numbers.
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
## 2020-07-19 NOTE — ED Triage Notes (Signed)
Pt bib gems from [place] for suspected sepsis. NP at [facility] reports o2 sat of 88% on 2L, systolic BP in the 60's, and RR 30's. NP also reports WBC of 19.3, hgb of 7.3, creatinine of 2.02, and Na of 126. Pt also has unstageable sacral wound with infection that is being treated with doxy starting on 2/24.   BP: 110/40 O2: 100% 2L HR: 80's paced  RR: 35
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
## 2020-07-19 NOTE — ED Notes (Signed)
Extreme difficulties with IV access for this pt. Labs and culture obtained from IJ placed by Dr Wilkie Aye.

## 2020-07-19 NOTE — ED Notes (Signed)
Dr Wilkie Aye and IV team working in tandem to try and gain access.

## 2020-07-20 ENCOUNTER — Inpatient Hospital Stay (HOSPITAL_COMMUNITY): Payer: Medicare Other

## 2020-07-20 DIAGNOSIS — M7989 Other specified soft tissue disorders: Secondary | ICD-10-CM

## 2020-07-20 DIAGNOSIS — E43 Unspecified severe protein-calorie malnutrition: Secondary | ICD-10-CM | POA: Diagnosis not present

## 2020-07-20 DIAGNOSIS — L89154 Pressure ulcer of sacral region, stage 4: Secondary | ICD-10-CM | POA: Diagnosis not present

## 2020-07-20 DIAGNOSIS — M79622 Pain in left upper arm: Secondary | ICD-10-CM | POA: Diagnosis not present

## 2020-07-20 DIAGNOSIS — N179 Acute kidney failure, unspecified: Secondary | ICD-10-CM | POA: Diagnosis not present

## 2020-07-20 DIAGNOSIS — A419 Sepsis, unspecified organism: Secondary | ICD-10-CM | POA: Diagnosis not present

## 2020-07-20 LAB — BLOOD CULTURE ID PANEL (REFLEXED) - BCID2

## 2020-07-20 LAB — HEMOGLOBIN AND HEMATOCRIT, BLOOD
HCT: 24 % — ABNORMAL LOW (ref 39.0–52.0)
Hemoglobin: 7.5 g/dL — ABNORMAL LOW (ref 13.0–17.0)

## 2020-07-20 LAB — MRSA PCR SCREENING: MRSA by PCR: NEGATIVE

## 2020-07-20 LAB — CBC
HCT: 22 % — ABNORMAL LOW (ref 39.0–52.0)
Hemoglobin: 7.2 g/dL — ABNORMAL LOW (ref 13.0–17.0)
MCH: 26.6 pg (ref 26.0–34.0)
MCHC: 32.7 g/dL (ref 30.0–36.0)
MCV: 81.2 fL (ref 80.0–100.0)
Platelets: 407 10*3/uL — ABNORMAL HIGH (ref 150–400)
RBC: 2.71 MIL/uL — ABNORMAL LOW (ref 4.22–5.81)
RDW: 15 % (ref 11.5–15.5)
WBC: 14.5 10*3/uL — ABNORMAL HIGH (ref 4.0–10.5)
nRBC: 0 % (ref 0.0–0.2)

## 2020-07-20 LAB — BASIC METABOLIC PANEL
Anion gap: 13 (ref 5–15)
BUN: 77 mg/dL — ABNORMAL HIGH (ref 8–23)
CO2: 18 mmol/L — ABNORMAL LOW (ref 22–32)
Calcium: 9.5 mg/dL (ref 8.9–10.3)
Chloride: 99 mmol/L (ref 98–111)
Creatinine, Ser: 1.49 mg/dL — ABNORMAL HIGH (ref 0.61–1.24)
GFR, Estimated: 48 mL/min — ABNORMAL LOW (ref >60)
Glucose, Bld: 96 mg/dL (ref 70–99)
Potassium: 4 mmol/L (ref 3.5–5.1)
Sodium: 130 mmol/L — ABNORMAL LOW (ref 135–145)

## 2020-07-20 LAB — PROTIME-INR
INR: 2.3 — ABNORMAL HIGH (ref 0.8–1.2)
Prothrombin Time: 24.4 seconds — ABNORMAL HIGH (ref 11.4–15.2)

## 2020-07-20 MED ORDER — CHLORHEXIDINE GLUCONATE CLOTH 2 % EX PADS
6.0000 | MEDICATED_PAD | Freq: Every day | CUTANEOUS | Status: DC
  Administered 2020-07-20 – 2020-07-26 (×6): 6 via TOPICAL

## 2020-07-20 MED ORDER — VANCOMYCIN HCL 1250 MG/250ML IV SOLN: 1250.0000 mg | INTRAVENOUS | Status: DC

## 2020-07-20 MED ORDER — PANTOPRAZOLE SODIUM 40 MG IV SOLR
40.0000 mg | INTRAVENOUS | Status: DC
  Administered 2020-07-20 – 2020-07-26 (×7): 40 mg via INTRAVENOUS
  Filled 2020-07-20 (×7): qty 40
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
## 2020-07-20 NOTE — Consult Note (Signed)
WOC Nurse Consult Note: Reason for Consult: Sacral pressure injury (Stage 4) and left lateral heel Unstageable pressure injury. Last seen by my associate on 06/16/20.  Wound with rapid (less than 5 weeks) decline. Wound type:Pressure Pressure Injury POA: Yes Measurement:7cm x 7cm x 4cm Stage 4 PI to sacrum with friable wound periphery.  30% nonviable white tissue in wound bed, 70% red, moist bleeding. Left lateral heel Unstageable pressure injury measuring 2cm x 1.6cm Black soft eschar. Dry. Wound bed:As noted above Drainage (amount, consistency, odor) As noted above Periwound: intact Dressing procedure/placement/frequency: A mattress replacement and bilateral pressure redistribution heel boots are ordered. Topical care for the left lateral heel will be with xeroform gauze topped with silicone foam and foot placed into boot.  The sacral wound would benefit from a surgical consult for affirmation of POC and to determine if additional measures are required.  If you agree, please order. In the meantime, I will have nursing fill defect with a calcium alginate dressing, topped with fluffed gauze to bring to skin level and cover with silicone foam.  Turning will be from side to side with no time spent in the supine position.  A nutritional consult is ordered for the increased demands of wound healing.  WOC nursing team will not follow, but will remain available to this patient, the nursing and medical teams.  Please re-consult if needed. Thanks, Ladona Mow, MSN, RN, GNP, [place]  Pager# [phone]

## 2020-07-20 NOTE — Progress Notes (Addendum)
Progress Note    Ruben Walker  XLK:[id number] DOB: 10-Feb-1941  DOA: 07/19/2020 PCP: Tammi Sou, MD    Brief Narrative:   Medical records reviewed and are as summarized below:  Ruben Walker is an 80 y.o. male with medical history significant of HTN, HLD, chronic diastolic CHF, A. fib on Coumadin, h/o cardiac arrest s/p pacemaker/AICD, chronic respiratory failure on 2 L of nasal cannula oxygen, CKD stage III, and OSA on CPAP who presents presented after being found to be hypoxic with low blood pressures.  History is mostly obtained from review of records and talking with his nurse over the phone.  Seem not to be as normal self yesterday and he was more lethargic.  His O2 saturations were noted to be as low as 77% on 2 L oxygen.  Staff had tried bumping up the patient's oxygen to 3 L, but only had improvement in O2 saturations up to 85%.  His systolic blood pressures were initially reported in the 60s with respirations in 30s.  Has worsening decubitus wound.    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  Patient was noted to be tachypneic with white blood cell count elevated up to 16.  At the facility he was reportedly tachypneic and temporarily hypotensive.  Work-up revealed signs of possible infection on urinalysis, but patient has chronic indwelling Foley.  He also has a large sacral decubitus ulcer with purulent-like drainage that  was present and surrounding cellulitis. -Check blood and urine cultures -ESR, CRP- elevated -Empiric antibiotics of vancomycin and Rocephin -Low-air-loss mattress replacement -Wound care consult  Acute on chronic respiratory failure with hypoxia: Patient normally on 2 L of oxygen at baseline, but reportedly had O2 saturations as low as 77% on 2 L at the facility.  O2 saturations currently maintained on home 2 L.  Chest x-ray showing chronic bronchichitic changes -Albuterol nebs as needed for shortness of breath or wheezing  Acute blood loss anemia secondary to possible GI bleed vs large bruise on left upper extremity: On admission patient's glucose was noted to be 7.6 - Stool guaiacs were noted to be positive.  Given the elevated BUN to creatinine ratio this gives concern for possible upper GI bleed. -Transfuse 1 unit of packed red blood cells -GI consult -check U/S of arm   Acute kidney injury superimposed on chronic kidney disease stage IIIb: Patient presents with creatinine elevated up to 1.89 with BUN 83.  Baseline creatinine previously noted to be 1.35 on 2/4.  Suspect prerenal cause of symptoms given elevated BUN to creatinine ratio. -improved with IVF  Atrial fibrillation on chronic anticoagulation: Patient with history of atrial fibrillation for which he has been on Coumadin.  INR therapeutic at 2.3.  Possibly stopping anticoagulation have been discussed due to the patient's risk of falls, but he did not want to stop the medication due to the possible risk of stroke. -Hold Coumadin due to concern for acute bleed -daily  INR  Chronic diastolic congestive heart failure: Patient appears to be euvolemic at this time.  BNP was just mildly elevated at 271.9. -Strict intake and output -Daily weights  Hyponatremia: Initial sodium 126 on admission.  Suspecting a hypovolemic hyponatremia. -improved with IVF  Severe protein calorie malnutrition: On admission albumin noted to be  significantly low at 1.8.  Patient reports that appetite has been decreased. -prealbumin 5  History of cardiac arrest status post AICD/pacemaker  History of Covid infection: Patient was seen to be Covid positive on 06/14/2020. -No need for continuation of airborne precautions.  Generalized weakness: Patient has been having frequent falls, but have been basically bedbound since being at the facility. -PT/OT to eval and treat  OSA  -Continue cpap (BIPAPnoted on paperwork from facility)  Pressure Injury 07/20/20 Sacrum Unstageable - Full thickness tissue loss in which the base of the injury is covered by slough (yellow, tan, gray, green or brown) and/or eschar (tan, brown or black) in the wound bed. Tunnelling, gray eschar (Active)  07/20/20 0021  Location: Sacrum  Location Orientation:   Staging: Unstageable - Full thickness tissue loss in which the base of the injury is covered by slough (yellow, tan, gray, green or brown) and/or eschar (tan, brown or black) in the wound bed.  Wound Description (Comments): Tunnelling, gray eschar  Present on Admission: Yes     Pressure Injury 07/20/20 Buttocks Right Stage 2 -  Partial thickness loss of dermis presenting as a shallow open injury with a red, pink wound bed without slough. (Active)  07/20/20 0026  Location: Buttocks  Location Orientation: Right  Staging: Stage 2 -  Partial thickness loss of dermis presenting as a shallow open injury with a red, pink wound bed without slough.  Wound Description (Comments):   Present on Admission: Yes     Pressure Injury 07/20/20 Buttocks Left Stage 2 -  Partial thickness loss of dermis presenting as a shallow open injury with a red, pink wound bed without slough. (Active)  07/20/20 0027  Location: Buttocks  Location Orientation: Left  Staging: Stage 2 -  Partial thickness loss of dermis presenting as a shallow open injury with a red, pink wound bed without slough.  Wound Description (Comments):    Present on Admission: Yes     Pressure Injury 07/20/20 Heel Left Deep Tissue Pressure Injury - Purple or maroon localized area of discolored intact skin or blood-filled blister due to damage of underlying soft tissue from pressure and/or shear. (Active)  07/20/20 0032  Location: Heel  Location Orientation: Left  Staging: Deep Tissue Pressure Injury - Purple or maroon localized area of discolored intact skin or blood-filled blister due to damage of underlying soft tissue from pressure and/or shear.  Wound Description (Comments):   Present on Admission: Yes   -general surgery consult    Family Communication/Anticipated D/C date and plan/Code Status   DVT prophylaxis: warfarin  Code Status: DNR Disposition Plan: Status is: Inpatient  Remains inpatient appropriate because:Inpatient level of care appropriate due to severity of illness   Dispo: The patient is from: Home              Anticipated d/c is to: Home              Patient currently is not medically stable to d/c.   Difficult to place patient No    Medical Consultants:    GI  General surgery    Subjective:   Wants to be left alone  Objective:  Vitals:   07/19/20 2230 07/20/20 0130 07/20/20 0518 07/20/20 0940  BP: 134/76 132/76 (!) 111/51 93/69  Pulse: 70 72 71 71  Resp: '20 18 20 19  ' Temp: 98.1 F (36.7 C) 98.4 F (36.9 C) 98.1 F (36.7 C) 98.2 F (36.8 C)  TempSrc: Oral Oral    SpO2: 100% 96% 95% 100%    Intake/Output Summary (Last 24 hours) at 07/20/2020 0949 Last data filed at 07/20/2020 1443 Gross per 24 hour  Intake 2663.46 ml  Output 750 ml  Net 1913.46 ml   There were no vitals filed for this visit.  Exam:   Elderly male, hard of hearing rrr On O2, diminished, poor effort Large hematoma on left upper arm    Data Reviewed:   I have personally reviewed following labs and imaging studies:  Labs: Labs show the following:   Basic Metabolic Panel: Recent Labs  Lab  07/19/20 1254 07/20/20 0729  NA 126* 130*  K 5.1 4.0  CL 95* 99  CO2 18* 18*  GLUCOSE 102* 96  BUN 83* 77*  CREATININE 1.89* 1.49*  CALCIUM 9.6 9.5   GFR CrCl cannot be calculated (Unknown ideal weight.). Liver Function Tests: Recent Labs  Lab 07/19/20 1254  AST 27  ALT 21  ALKPHOS 96  BILITOT 0.9  PROT 6.3*  ALBUMIN 1.8*   No results for input(s): LIPASE, AMYLASE in the last 168 hours. No results for input(s): AMMONIA in the last 168 hours. Coagulation profile Recent Labs  Lab 07/19/20 1300 07/20/20 0729  INR 2.3* 2.3*    CBC: Recent Labs  Lab 07/19/20 1254 07/20/20 0729  WBC 16.0* 14.5*  NEUTROABS 13.5*  --   HGB 7.6* 7.2*  HCT 24.6* 22.0*  MCV 84.5 81.2  PLT 436* 407*   Cardiac Enzymes: No results for input(s): CKTOTAL, CKMB, CKMBINDEX, TROPONINI in the last 168 hours. BNP (last 3 results) No results for input(s): PROBNP in the last 8760 hours. CBG: No results for input(s): GLUCAP in the last 168 hours. D-Dimer: No results for input(s): DDIMER in the last 72 hours. Hgb A1c: No results for input(s): HGBA1C in the last 72 hours. Lipid Profile: No results for input(s): CHOL, HDL, LDLCALC, TRIG, CHOLHDL, LDLDIRECT in the last 72 hours. Thyroid function studies: No results for input(s): TSH, T4TOTAL, T3FREE, THYROIDAB in the last 72 hours.  Invalid input(s): FREET3 Anemia work up: No results for input(s): VITAMINB12, FOLATE, FERRITIN, TIBC, IRON, RETICCTPCT in the last 72 hours. Sepsis Labs: Recent Labs  Lab 07/19/20 1254 07/19/20 1608 07/19/20 2150 07/20/20 0729  WBC 16.0*  --   --  14.5*  LATICACIDVEN  --  0.9 1.6  --     Microbiology Recent Results (from the past 240 hour(s))  MRSA PCR Screening     Status: None   Collection Time: 07/19/20  9:43 PM   Specimen: Nasal Mucosa; Nasopharyngeal  Result Value Ref Range Status   MRSA by PCR NEGATIVE NEGATIVE Final    Comment:        The GeneXpert MRSA Assay (FDA approved for NASAL  specimens only), is one component of a comprehensive MRSA colonization surveillance program. It is not intended to diagnose MRSA infection nor to guide or monitor treatment for MRSA infections. Performed at [facility], [place] [address] [place], [place] [address]     Procedures and diagnostic studies:  DG Chest Port 1 View  Result Date: 07/19/2020 CLINICAL DATA:  Cough, code sepsis EXAM: PORTABLE CHEST 1 VIEW COMPARISON:  Portable exam 1336 hours compared to 06/17/2020 FINDINGS: LEFT subclavian ICD with lead projecting over RIGHT ventricle. Enlargement of cardiac silhouette with slight vascular congestion. Atherosclerotic calcification aorta. Lungs grossly clear. Mild central peribronchial thickening again noted. No definite acute infiltrate, pleural effusion or pneumothorax. Bones demineralized. IMPRESSION: Enlargement of cardiac silhouette post ICD. Mild bronchitic changes without infiltrate. Electronically Signed   By: Lavonia Dana M.D.   On: 07/19/2020 14:18    Medications:   . sodium chloride   Intravenous Once  . Chlorhexidine Gluconate Cloth  6 each Topical Daily  . feeding supplement  237 mL Oral TID BM  . sodium chloride flush  10-40 mL Intracatheter Q12H  . sodium chloride flush  3 mL Intravenous Q12H  . vancomycin variable dose per unstable renal function (pharmacist dosing)   Does not apply See admin instructions   Continuous Infusions: . sodium chloride 75 mL/hr at 07/20/20 0136  . cefTRIAXone (ROCEPHIN)  IV Stopped (07/19/20 1823)  . [START ON 07/21/2020] vancomycin       LOS: 1 day   Geradine Girt  [facility]   How to contact the [facility] Attending or Consulting provider [place] or covering provider during after hours [place], for this patient?  1. Check the care team in [facility] and look for a) attending/consulting [facility] provider listed and b) the [facility] team listed 2. Log into [email or web address] and use [place]'s universal password to access. If you do not have  the password, please contact the hospital operator. 3. Locate the [facility] provider you are looking for under [facility] and page to a number that you can be directly reached. 4. If you still have difficulty reaching the provider, please page the [facility] (Director on Call) for the Hospitalists listed on amion for assistance.  07/20/2020, 9:49 AM

## 2020-07-20 NOTE — Progress Notes (Signed)
Pharmacy Antibiotic Note  Ruben Walker is a 80 y.o. male admitted on 07/19/2020 with sepsis.  Pharmacy has been consulted for vancomycin dosing.  The patient's SCr has trended down to 1.49, will start standing doses - q48h for now.   Plan: - Vancomycin 1250 mg IV every 48 hours (est AUC 468, SCr 1.49) - Cont CTX per MD - Will continue to follow renal function, culture results, LOT, and antibiotic de-escalation plans   Temp (24hrs), Avg:98.1 F (36.7 C), Min:97.5 F (36.4 C), Max:98.4 F (36.9 C)  Recent Labs  Lab 07/19/20 1254 07/19/20 1608 07/19/20 2150 07/20/20 0729  WBC 16.0*  --   --  14.5*  CREATININE 1.89*  --   --  1.49*  LATICACIDVEN  --  0.9 1.6  --     CrCl cannot be calculated (Unknown ideal weight.).    No Known Allergies  Antimicrobials this admission: Vancomycin 3/1> Ceftriaxone 3/1>  Dose adjustments this admission: N/A  Thank you for allowing pharmacy to be a part of this patient's care.  Georgina Pillion, PharmD, BCPS Clinical Pharmacist Clinical phone for 07/20/2020: [phone] 07/20/2020 9:00 AM   **Pharmacist phone directory can now be found on [email or web address] ([email or web address]).  Listed under [facility].

## 2020-07-20 NOTE — Plan of Care (Signed)
  Problem: Clinical Measurements: Goal: Diagnostic test results will improve Outcome: Completed/Met Goal: Respiratory complications will improve Outcome: Completed/Met Goal: Cardiovascular complication will be avoided Outcome: Completed/Met   

## 2020-07-20 NOTE — Progress Notes (Signed)
RT visited with pt about cpap.  Communicating with pt seemed difficult because I was never sure he understood what I was talking about.  RT spoke to him about the cpap and he agreed for me to bring device.  Rt returned shortly with device and placed it on pt, reluctantly d/t pt reaction time and possibly not understanding what I was doing to him.  After placing mask, pt said he was told he didn't need to wear the cpap.  RT asked who told him that but he didn't know who it was.  I asked pt if he wanted to wear it and he stated "no, not if I don't have to.'  Rt unaware of sleep study results and settings and what pt is capable of doing if he were to vomit in mask.  At this time, I have removed pt from device and removed device from room.  Further evaluation should be performed before placing pt on device.  Rt will continue to monitor.

## 2020-07-20 NOTE — Progress Notes (Signed)
New Admission Note:  Arrival Method:Stretcher Mental Orientation: Alert and oriented x3-4 Telemetry: Box 05 Assessment: Completed Skin: warm and dry. Pressure ulcers to buttocks and left heel.  IV: NSLs Pain: Denies Tubes: Foley  Safety Measures: Safety Fall Prevention Plan initiated.  Admission: Completed 5 M  Orientation: Patient has been orientated to the room, unit and the staff. Welcome booklet given.  Family: N/A  Orders have been reviewed and implemented. Will continue to monitor the patient. Call light has been placed within reach and bed alarm has been activated.   Guilford Shi BSN, RN  Phone Number: [phone]

## 2020-07-20 NOTE — Progress Notes (Signed)
Spoke with Dorene Grebe RN about the LUA, possible infiltration from previous IV that may have been placed in the ER. Currently there is nothing that the IV team would do since there is no IV current to the left side. Patient has adequate access to the right arm.

## 2020-07-20 NOTE — Progress Notes (Signed)
PHARMACY - PHYSICIAN COMMUNICATION CRITICAL VALUE ALERT - BLOOD CULTURE IDENTIFICATION (BCID)  Ruben Walker is an 80 y.o. male who presented to [facility] on 07/19/2020  Assessment:  62 yom presenting with sepsis secondary to infected sacral decubitus skin ulcer with cellulitis and/or complicated UTI. 1 of 4 BCx resulted as coag negative staph. Patient does have hx of cardiac arrest s/p AICD/pacemaker.  Name of physician (or Provider) Contacted: [place], V  Current antibiotics: vancomycin/ceftriaxone  Changes to prescribed antibiotics recommended:  None for now  Results for orders placed or performed during the hospital encounter of 07/19/20  Blood Culture ID Panel (Reflexed) (Collected: 07/19/2020  5:37 PM)  Result Value Ref Range   Enterococcus faecalis NOT DETECTED NOT DETECTED   Enterococcus Faecium NOT DETECTED NOT DETECTED   Listeria monocytogenes NOT DETECTED NOT DETECTED   Staphylococcus species DETECTED (A) NOT DETECTED   Staphylococcus aureus (BCID) NOT DETECTED NOT DETECTED   Staphylococcus epidermidis NOT DETECTED NOT DETECTED   Staphylococcus lugdunensis NOT DETECTED NOT DETECTED   Streptococcus species NOT DETECTED NOT DETECTED   Streptococcus agalactiae NOT DETECTED NOT DETECTED   Streptococcus pneumoniae NOT DETECTED NOT DETECTED   Streptococcus pyogenes NOT DETECTED NOT DETECTED   A.calcoaceticus-baumannii NOT DETECTED NOT DETECTED   Bacteroides fragilis NOT DETECTED NOT DETECTED   Enterobacterales NOT DETECTED NOT DETECTED   Enterobacter cloacae complex NOT DETECTED NOT DETECTED   Escherichia coli NOT DETECTED NOT DETECTED   Klebsiella aerogenes NOT DETECTED NOT DETECTED   Klebsiella oxytoca NOT DETECTED NOT DETECTED   Klebsiella pneumoniae NOT DETECTED NOT DETECTED   Proteus species NOT DETECTED NOT DETECTED   Salmonella species NOT DETECTED NOT DETECTED   Serratia marcescens NOT DETECTED NOT DETECTED   Haemophilus influenzae NOT DETECTED NOT DETECTED    Neisseria meningitidis NOT DETECTED NOT DETECTED   Pseudomonas aeruginosa NOT DETECTED NOT DETECTED   Stenotrophomonas maltophilia NOT DETECTED NOT DETECTED   Candida albicans NOT DETECTED NOT DETECTED   Candida auris NOT DETECTED NOT DETECTED   Candida glabrata NOT DETECTED NOT DETECTED   Candida krusei NOT DETECTED NOT DETECTED   Candida parapsilosis NOT DETECTED NOT DETECTED   Candida tropicalis NOT DETECTED NOT DETECTED   Cryptococcus neoformans/gattii NOT DETECTED NOT DETECTED    Leia Alf, PharmD, BCPS Please check AMION for all [facility] Pharmacy contact numbers Clinical Pharmacist 07/20/2020 9:07 PM
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
## 2020-07-20 NOTE — Plan of Care (Signed)
  Problem: Education: Goal: Knowledge of General Education information will improve Description: Including pain rating scale, medication(s)/side effects and non-pharmacologic comfort measures Outcome: Completed/Met
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
## 2020-07-20 NOTE — Progress Notes (Signed)
Measured LUA = 29.3 cm  swollen & ecchymosis present.   MD Benjamine Mola aware and assessed pt at bedside.

## 2020-07-21 DIAGNOSIS — A419 Sepsis, unspecified organism: Secondary | ICD-10-CM | POA: Diagnosis not present

## 2020-07-21 LAB — CBC
HCT: 23.7 % — ABNORMAL LOW (ref 39.0–52.0)
Hemoglobin: 7.3 g/dL — ABNORMAL LOW (ref 13.0–17.0)
MCH: 25.8 pg — ABNORMAL LOW (ref 26.0–34.0)
MCHC: 30.8 g/dL (ref 30.0–36.0)
MCV: 83.7 fL (ref 80.0–100.0)
Platelets: 400 10*3/uL (ref 150–400)
RBC: 2.83 MIL/uL — ABNORMAL LOW (ref 4.22–5.81)
RDW: 15 % (ref 11.5–15.5)
WBC: 12.1 10*3/uL — ABNORMAL HIGH (ref 4.0–10.5)
nRBC: 0 % (ref 0.0–0.2)

## 2020-07-21 LAB — BASIC METABOLIC PANEL
Anion gap: 11 (ref 5–15)
BUN: 65 mg/dL — ABNORMAL HIGH (ref 8–23)
CO2: 18 mmol/L — ABNORMAL LOW (ref 22–32)
Calcium: 9.3 mg/dL (ref 8.9–10.3)
Chloride: 104 mmol/L (ref 98–111)
Creatinine, Ser: 1.41 mg/dL — ABNORMAL HIGH (ref 0.61–1.24)
GFR, Estimated: 51 mL/min — ABNORMAL LOW (ref >60)
Glucose, Bld: 103 mg/dL — ABNORMAL HIGH (ref 70–99)
Potassium: 4.4 mmol/L (ref 3.5–5.1)
Sodium: 133 mmol/L — ABNORMAL LOW (ref 135–145)

## 2020-07-21 LAB — HEMOGLOBIN AND HEMATOCRIT, BLOOD
HCT: 20.7 % — ABNORMAL LOW (ref 39.0–52.0)
HCT: 28.9 % — ABNORMAL LOW (ref 39.0–52.0)
Hemoglobin: 6.7 g/dL — CL (ref 13.0–17.0)
Hemoglobin: 9.2 g/dL — ABNORMAL LOW (ref 13.0–17.0)

## 2020-07-21 LAB — PREPARE RBC (CROSSMATCH)

## 2020-07-21 LAB — PROTIME-INR
INR: 1.9 — ABNORMAL HIGH (ref 0.8–1.2)
Prothrombin Time: 21.3 seconds — ABNORMAL HIGH (ref 11.4–15.2)

## 2020-07-21 MED ORDER — SODIUM CHLORIDE 0.9% IV SOLUTION
Freq: Once | INTRAVENOUS | Status: AC
  Administered 2020-07-21: 10 mL/h via INTRAVENOUS

## 2020-07-21 MED ORDER — VANCOMYCIN HCL 750 MG/150ML IV SOLN
750.0000 mg | INTRAVENOUS | Status: DC
  Administered 2020-07-21 – 2020-07-22 (×2): 750 mg via INTRAVENOUS
  Filled 2020-07-21 (×3): qty 150

## 2020-07-21 MED ORDER — WARFARIN SODIUM 2.5 MG PO TABS: 2.5000 mg | ORAL_TABLET | Freq: Once | ORAL | Status: DC

## 2020-07-21 MED ORDER — WARFARIN - PHARMACIST DOSING INPATIENT: Freq: Every day | Status: DC

## 2020-07-21 MED ORDER — TRAMADOL HCL 50 MG PO TABS
50.0000 mg | ORAL_TABLET | Freq: Two times a day (BID) | ORAL | Status: DC
Start: 2020-07-21 — End: 2020-07-27
  Administered 2020-07-21 – 2020-07-26 (×12): 50 mg via ORAL
  Filled 2020-07-21 (×12): qty 1

## 2020-07-21 NOTE — Plan of Care (Signed)
  Problem: Activity: Goal: Risk for activity intolerance will decrease Outcome: Not Progressing   Problem: Nutrition: Goal: Adequate nutrition will be maintained Outcome: Not Progressing   

## 2020-07-21 NOTE — Progress Notes (Addendum)
PROGRESS NOTE    Ruben Walker  UMP:[id number] DOB: January 25, 1941 DOA: 07/19/2020 PCP: Tammi Sou, MD   Brief Narrative: 35 yomale with history of HTN, HLD, chronic diastolic CHF, A. fib on Coumadin history of cardiac arrest s/ppacemaker/AICD, chronic respiratory failure on 2 L oxygen, CKD 3, and OSA on CPAP, poor historian on admission admitted with hypoxia and hypotension on 07/19/20.  History was mostly obtained from review of records and talking with his nurse over the phone. Seem not to be as normal self 2/28-was more lethargic. His O2 saturationswere noted to be as low as 77% on 2 L oxygen.Staff had tried bumping up the patient's oxygen to 3 L, but only had improvement in O2 saturations up to 85%. His systolic blood pressureswere initially reportedin the 60s with respirations in 30s.  je also had worsening decubitus wound.   In the ED, afebrile, pulse 59-72, respirations 18-33, blood pressure 86/39-163/143, and O2 saturations currently maintained on 2 L of nasal cannula oxygen.  Labs significant for WBC 16, hemoglobin 7.6, platelets 436, sodium 126, chloride 95, CO2 18, BUN 83, creatinine 1.89, albumin 1.8, and BNP 271.9.  Chest x-ray was significant for cardiac enlargement with bronchiectatic changes.  Urinalysis was positive for small leukocytes, many bacteria, 11-20 WBCs.  Patient had been given 500 mL of IV fluids  Patient was admitted for hypoxia and hypotension concerning for sepsis- thought secondary to a sacral wound that appears to be infected. Noted to have AKI along with concern for complicated UTI due to chronic indwelling Foley. Placed on empiric antibiotics of vancomycin and Rocephin. Hemoglobin down to 7.6 g/dL with guaiac positive stools. GI have been formally consulted and patient ordered to be transfused 1 unit of PRBCs. GS consulted for wound. Also noted large area of bruising on left UE, [place] negative for dvt.  Subjective: Seen and examined this morning. He is  alert awake oriented to self, place thinks it is February 10 with patient's name Denies any neck pain back pain chest pain. No pain on the left upper extremity where he has significant bruise Afebrile overnight. Blood work showed creatinine stable 1.4, WBC downtrending  Assessment & Plan:  Sepsis secondary to infected sacral decubitus skin ulcer with cellulitis and/or complicated UTI ESR CRP elevated, on empiric vancomycin Rocephin, supportive measures low-air-loss mattress placement, wound care consulted.  Blood culture ID panel positive for Staphylococcus 2 of 4 bottles, ?contamination pending blood culture.  Anemia likely multifactorial possible acute blood loss anemia 2/2 possible GI bleed versus large bruise on the left upper extremity, FOBT positive: Status post 1 unit PRBC.  H&H remains low, monitor closely, GI on board.  Bruise NEG for DVT on the ultrasound.  No active GI bleed as per GI and not pursuing invasive endoscopic procedure at this time Recent Labs  Lab 07/19/20 1254 07/20/20 0729 07/20/20 1328 07/21/20 0304  HGB 7.6* 7.2* 7.5* 7.3*  HCT 24.6* 22.0* 24.0* 23.7*   Left upper extremity bruise ?etiology-?from BP cuff in the setting of Coumadin.  Negative for duplex DVT.  Patient denies any complaint.  Monitor  Sacral decubitus ulcer stage IV QMG:[id number] surgery input, no indication for debridement and has advised WTD bid dressing changes and more frequent for soiling.  Has a small area of necrotic tissue on the edge and will likely slough off.  Wound care following  Acute on chronic hypoxic respiratory failure, hypoxic on presentation chest x-ray chronic bronchitis changes, continue supplemental oxygen, bronchodilators  AKI on CKD stage IIIb, creatinine  10 over past year baseline around 1.8, appears improved Recent Labs    06/18/20 0332 06/19/20 0217 06/20/20 0748 06/21/20 0315 06/22/20 0724 06/23/20 0319 06/24/20 0619 07/19/20 1254 07/20/20 0729  07/21/20 0304  BUN 66* 77* 77* 81* 75* 71* 56* 83* 77* 65*  CREATININE 1.89* 2.32* 1.87* 1.87* 1.67* 1.69* 1.35* 1.89* 1.49* 1.41*   Permanent atrial fibrillation on Coumadin.Possibly stopping anticoagulation have been discussed due to the patient's risk of falls, but he did not want to stop the medication due to the possible risk of stroke.  Coumadin was held due to concern for acute bleeding-Per GI no active GI bleeding and okay to resume based on risk benefits.  Pharmacy to dose.  When discussed with patient he was to resume Coumadin Recent Labs  Lab 07/19/20 1300 07/20/20 0729 07/21/20 0304  INR 2.3* 2.3* 1.9*   Chronic diastolic CHF: Euvolemic BNP 271, monitor intake output Daily weight Net IO Since Admission: 3,726.22 mL [07/21/20 0745]  Hyponatremia 126 on admission suspected hypovolemic.  Improved with fluids. Recent Labs  Lab 07/19/20 1254 07/20/20 0729 07/21/20 0304  NA 126* 130* 133*   History of cardiac arrest post AICD/pacemaker  History of COVID infection 06/14/2020  Generalized weakness/debility/deconditioning continue PT OT.  Has had frequent falls.  Been basically bedbound  OSA-continue CPAP/?bipapa at snf  Severe protein calorie malnutrition-see below 1.2 prealbumin 5 continue to augment diet.  Addendum H/h dropped to 6.7 gm, wil transfuse 1 unit prbc and hold coumadin for now- Spoke w/ Dr Michail Sermon- feels likely  from left arm hematoma.   Diet Order            Diet clear liquid Room service appropriate? Yes; Fluid consistency: Thin  Diet effective now                Pressure Injury 07/20/20 Sacrum Unstageable - Full thickness tissue loss in which the base of the injury is covered by slough (yellow, tan, gray, green or brown) and/or eschar (tan, brown or black) in the wound bed. Tunnelling, gray eschar (Active)  07/20/20 0021  Location: Sacrum  Location Orientation:   Staging: Unstageable - Full thickness tissue loss in which the base of the injury is  covered by slough (yellow, tan, gray, green or brown) and/or eschar (tan, brown or black) in the wound bed.  Wound Description (Comments): Tunnelling, gray eschar  Present on Admission: Yes     Pressure Injury 07/20/20 Buttocks Right Stage 2 -  Partial thickness loss of dermis presenting as a shallow open injury with a red, pink wound bed without slough. (Active)  07/20/20 0026  Location: Buttocks  Location Orientation: Right  Staging: Stage 2 -  Partial thickness loss of dermis presenting as a shallow open injury with a red, pink wound bed without slough.  Wound Description (Comments):   Present on Admission: Yes     Pressure Injury 07/20/20 Buttocks Left Stage 2 -  Partial thickness loss of dermis presenting as a shallow open injury with a red, pink wound bed without slough. (Active)  07/20/20 0027  Location: Buttocks  Location Orientation: Left  Staging: Stage 2 -  Partial thickness loss of dermis presenting as a shallow open injury with a red, pink wound bed without slough.  Wound Description (Comments):   Present on Admission: Yes     Pressure Injury 07/20/20 Heel Left Deep Tissue Pressure Injury - Purple or maroon localized area of discolored intact skin or blood-filled blister due to damage of underlying soft tissue  from pressure and/or shear. (Active)  07/20/20 0032  Location: Heel  Location Orientation: Left  Staging: Deep Tissue Pressure Injury - Purple or maroon localized area of discolored intact skin or blood-filled blister due to damage of underlying soft tissue from pressure and/or shear.  Wound Description (Comments):   Present on Admission: Yes   DVT prophylaxis: SCDs Start: 07/19/20 1557 coumadin Code Status:   Code Status: DNR  Family Communication: plan of care discussed with patient at bedside.  Status is: Inpatient Remains inpatient appropriate because:Inpatient level of care appropriate due to severity of illness  Dispo: The patient is from: Home               Anticipated d/c is to: SNF. PTOT eval requested.              Patient currently is not medically stable to d/c.   Difficult to place patient No   Unresulted Labs (From admission, onward)          Start     Ordered   07/20/20 0959  Culture, Urine  Once,   R        07/20/20 0958   07/20/20 0500  Protime-INR  Daily,   R      07/19/20 1716          Medications reviewed:  Scheduled Meds: . sodium chloride   Intravenous Once  . Chlorhexidine Gluconate Cloth  6 each Topical Daily  . feeding supplement  237 mL Oral TID BM  . pantoprazole (PROTONIX) IV  40 mg Intravenous Q24H  . sodium chloride flush  10-40 mL Intracatheter Q12H  . sodium chloride flush  3 mL Intravenous Q12H  . vancomycin variable dose per unstable renal function (pharmacist dosing)   Does not apply See admin instructions   Continuous Infusions: . sodium chloride 75 mL/hr at 07/21/20 0636  . cefTRIAXone (ROCEPHIN)  IV 1 g (07/20/20 1604)  . vancomycin      Consultants:see note  Procedures:see note  Antimicrobials: Anti-infectives (From admission, onward)   Start     Dose/Rate Route Frequency Ordered Stop   07/21/20 1800  vancomycin (VANCOREADY) IVPB 1250 mg/250 mL        1,250 mg 166.7 mL/hr over 90 Minutes Intravenous Every 48 hours 07/20/20 0901     07/19/20 2110  vancomycin variable dose per unstable renal function (pharmacist dosing)         Does not apply See admin instructions 07/19/20 2110     07/19/20 1730  vancomycin (VANCOREADY) IVPB 1750 mg/350 mL        1,750 mg 175 mL/hr over 120 Minutes Intravenous  Once 07/19/20 1631 07/19/20 2111   07/19/20 1645  cefTRIAXone (ROCEPHIN) 1 g in sodium chloride 0.9 % 100 mL IVPB        1 g 200 mL/hr over 30 Minutes Intravenous Every 24 hours 07/19/20 1631       Culture/Microbiology    Component Value Date/Time   SDES BLOOD RIGHT ANTECUBITAL 07/19/2020 2148   [place]  07/19/2020 2148    BOTTLES DRAWN AEROBIC AND ANAEROBIC Blood Culture results may not  be optimal due to an inadequate volume of blood received in culture bottles   CULT  07/19/2020 2148    NO GROWTH < 12 HOURS Performed at [facility] [address], [place], [place] [address]    REPTSTATUS PENDING 07/19/2020 2148    Other culture-see note  Objective: Vitals: Today's Vitals   07/20/20 1130 07/20/20 1811 07/20/20  2035 07/21/20 0512  BP:  (!) 135/103 99/78 (!) 121/48  Pulse:  64 67 70  Resp:  _0 Temp:  98.2 F (36.8 C) 98 F (36.7 C) 98.4 F (36.9 C)  TempSrc:      SpO2:  99% 97% 97%  PainSc: 0-No pain  0-No pain 0-No pain    Intake/Output Summary (Last 24 hours) at 07/21/2020 0732 Last data filed at 07/21/2020 0600 Gross per 24 hour  Intake 3427.76 ml  Output 1375 ml  Net 2052.76 ml   There were no vitals filed for this visit. Weight change:   Intake/Output from previous day: 03/02 0701 - 03/03 0700 In: 3427.8 [P.O.:1660; I.V.:1667.8; IV Piggyback:100] Out: 1761 [Urine:1375] Intake/Output this shift: No intake/output data recorded. There were no vitals filed for this visit.  Examination: General exam: Alert awake elderly frail oriented to self place current president, weak appearing. HEENT:Oral mucosa moist, Ear/Nose WNL grossly,dentition normal. Respiratory system: bilaterally diminished,no use of accessory muscle, non tender. Cardiovascular system: S1 & S2 +, regular, No JVD. Gastrointestinal system: Abdomen soft, NT,ND, BS+. Nervous System:Alert, awake, moving extremities and grossly nonfocal Extremities: No edema, distal peripheral pulses palpable.  Skin: No rashes,no icterus. MSK: Normal muscle bulk,tone, power Significant bruise on the left upper arm Data Reviewed: I have personally reviewed following labs and imaging studies CBC: Recent Labs  Lab 07/19/20 1254 07/20/20 0729 07/20/20 1328 07/21/20 0304  WBC 16.0* 14.5*  --  12.1*  NEUTROABS 13.5*  --   --   --   HGB 7.6* 7.2* 7.5* 7.3*  HCT 24.6* 22.0* 24.0* 23.7*  MCV  84.5 81.2  --  83.7  PLT 436* 407*  --  607   Basic Metabolic Panel: Recent Labs  Lab 07/19/20 1254 07/20/20 0729 07/21/20 0304  NA 126* 130* 133*  K 5.1 4.0 4.4  CL 95* 99 104  CO2 18* 18* 18*  GLUCOSE 102* 96 103*  BUN 83* 77* 65*  CREATININE 1.89* 1.49* 1.41*  CALCIUM 9.6 9.5 9.3   GFR: CrCl cannot be calculated (Unknown ideal weight.). Liver Function Tests: Recent Labs  Lab 07/19/20 1254  AST 27  ALT 21  ALKPHOS 96  BILITOT 0.9  PROT 6.3*  ALBUMIN 1.8*   No results for input(s): LIPASE, AMYLASE in the last 168 hours. No results for input(s): AMMONIA in the last 168 hours. Coagulation Profile: Recent Labs  Lab 07/19/20 1300 07/20/20 0729 07/21/20 0304  INR 2.3* 2.3* 1.9*   Cardiac Enzymes: No results for input(s): CKTOTAL, CKMB, CKMBINDEX, TROPONINI in the last 168 hours. BNP (last 3 results) No results for input(s): PROBNP in the last 8760 hours. HbA1C: No results for input(s): HGBA1C in the last 72 hours. CBG: No results for input(s): GLUCAP in the last 168 hours. Lipid Profile: No results for input(s): CHOL, HDL, LDLCALC, TRIG, CHOLHDL, LDLDIRECT in the last 72 hours. Thyroid Function Tests: No results for input(s): TSH, T4TOTAL, FREET4, T3FREE, THYROIDAB in the last 72 hours. Anemia Panel: No results for input(s): VITAMINB12, FOLATE, FERRITIN, TIBC, IRON, RETICCTPCT in the last 72 hours. Sepsis Labs: Recent Labs  Lab 07/19/20 1608 07/19/20 2150  LATICACIDVEN 0.9 1.6    Recent Results (from the past 240 hour(s))  Culture, blood (x 2)     Status: None (Preliminary result)   Collection Time: 07/19/20  5:37 PM   Specimen: BLOOD  Result Value Ref Range Status   Specimen Description BLOOD  Final   Special Requests   Final  RIGHT IJ BOTTLES DRAWN AEROBIC AND ANAEROBIC Blood Culture results may not be optimal due to an inadequate volume of blood received in culture bottles   Culture  Setup Time   Final    GRAM POSITIVE COCCI IN CLUSTERS IN  BOTH AEROBIC AND ANAEROBIC BOTTLES Organism ID to follow CRITICAL RESULT CALLED TO, READ BACK BY AND VERIFIED WITHJiles [facility] PHARMD 2056 07/20/20 A BROWNING Performed at [facility], [place] [address], [place], [place] [address]    Culture PENDING  Incomplete   Report Status PENDING  Incomplete  Blood Culture ID Panel (Reflexed)     Status: Abnormal   Collection Time: 07/19/20  5:37 PM  Result Value Ref Range Status   Enterococcus faecalis NOT DETECTED NOT DETECTED Final   Enterococcus Faecium NOT DETECTED NOT DETECTED Final   Listeria monocytogenes NOT DETECTED NOT DETECTED Final   Staphylococcus species DETECTED (A) NOT DETECTED Final    Comment: CRITICAL RESULT CALLED TO, READ BACK BY AND VERIFIED WITH: [facility] PHARMD 2056 07/20/20 A BROWNING    Staphylococcus aureus (BCID) NOT DETECTED NOT DETECTED Final   Staphylococcus epidermidis NOT DETECTED NOT DETECTED Final   Staphylococcus lugdunensis NOT DETECTED NOT DETECTED Final   Streptococcus species NOT DETECTED NOT DETECTED Final   Streptococcus agalactiae NOT DETECTED NOT DETECTED Final   Streptococcus pneumoniae NOT DETECTED NOT DETECTED Final   Streptococcus pyogenes NOT DETECTED NOT DETECTED Final   A.calcoaceticus-baumannii NOT DETECTED NOT DETECTED Final   Bacteroides fragilis NOT DETECTED NOT DETECTED Final   Enterobacterales NOT DETECTED NOT DETECTED Final   Enterobacter cloacae complex NOT DETECTED NOT DETECTED Final   Escherichia coli NOT DETECTED NOT DETECTED Final   Klebsiella aerogenes NOT DETECTED NOT DETECTED Final   Klebsiella oxytoca NOT DETECTED NOT DETECTED Final   Klebsiella pneumoniae NOT DETECTED NOT DETECTED Final   Proteus species NOT DETECTED NOT DETECTED Final   Salmonella species NOT DETECTED NOT DETECTED Final   Serratia marcescens NOT DETECTED NOT DETECTED Final   Haemophilus influenzae NOT DETECTED NOT DETECTED Final   Neisseria meningitidis NOT DETECTED NOT DETECTED Final   Pseudomonas  aeruginosa NOT DETECTED NOT DETECTED Final   Stenotrophomonas maltophilia NOT DETECTED NOT DETECTED Final   Candida albicans NOT DETECTED NOT DETECTED Final   Candida auris NOT DETECTED NOT DETECTED Final   Candida glabrata NOT DETECTED NOT DETECTED Final   Candida krusei NOT DETECTED NOT DETECTED Final   Candida parapsilosis NOT DETECTED NOT DETECTED Final   Candida tropicalis NOT DETECTED NOT DETECTED Final   Cryptococcus neoformans/gattii NOT DETECTED NOT DETECTED Final    Comment: Performed at [facility], [address], [place], [place] [address]  MRSA PCR Screening     Status: None   Collection Time: 07/19/20  9:43 PM   Specimen: Nasal Mucosa; Nasopharyngeal  Result Value Ref Range Status   MRSA by PCR NEGATIVE NEGATIVE Final    Comment:        The GeneXpert MRSA Assay (FDA approved for NASAL specimens only), is one component of a comprehensive MRSA colonization surveillance program. It is not intended to diagnose MRSA infection nor to guide or monitor treatment for MRSA infections. Performed at [facility], [place] [address], [place], [place] [address]   Culture, blood (x 2)     Status: None (Preliminary result)   Collection Time: 07/19/20  9:48 PM   Specimen: BLOOD  Result Value Ref Range Status   Specimen Description BLOOD RIGHT ANTECUBITAL  Final   Special Requests   Final    BOTTLES DRAWN AEROBIC AND ANAEROBIC Blood Culture results may not be optimal due to an inadequate volume of blood received in culture bottles   Culture   Final    NO GROWTH < 12 HOURS Performed at [facility] [address], [place], [place] [address]    Report Status PENDING  Incomplete     Radiology Studies: DG Chest Port 1 View  Result Date: 07/19/2020 CLINICAL DATA:  Cough, code sepsis EXAM: PORTABLE CHEST 1 VIEW COMPARISON:  Portable exam 1336 hours compared to 06/17/2020 FINDINGS: LEFT subclavian ICD with lead projecting over RIGHT ventricle. Enlargement of cardiac  silhouette with slight vascular congestion. Atherosclerotic calcification aorta. Lungs grossly clear. Mild central peribronchial thickening again noted. No definite acute infiltrate, pleural effusion or pneumothorax. Bones demineralized. IMPRESSION: Enlargement of cardiac silhouette post ICD. Mild bronchitic changes without infiltrate. Electronically Signed   By: Lavonia Dana M.D.   On: 07/19/2020 14:18   VAS [place] UPPER EXTREMITY VENOUS DUPLEX  Result Date: 07/20/2020 UPPER VENOUS STUDY  Indications: upper arm bruise Limitations: Patient unable to position properly for exam. Comparison Study: no previous Performing Technologist: Vonzell Schlatter RVT  Examination Guidelines: A complete evaluation includes B-mode imaging, spectral Doppler, color Doppler, and power Doppler as needed of all accessible portions of each vessel. Bilateral testing is considered an integral part of a complete examination. Limited examinations for reoccurring indications may be performed as noted.  Left Findings: +----------+------------+---------+-----------+----------+-------+ LEFT      CompressiblePhasicitySpontaneousPropertiesSummary +----------+------------+---------+-----------+----------+-------+ IJV           Full       Yes       Yes                      +----------+------------+---------+-----------+----------+-------+ Subclavian               Yes       Yes                      +----------+------------+---------+-----------+----------+-------+ Axillary      Full       Yes       Yes                      +----------+------------+---------+-----------+----------+-------+ Brachial      Full                                          +----------+------------+---------+-----------+----------+-------+ Radial        Full                                          +----------+------------+---------+-----------+----------+-------+ Ulnar         Full                                           +----------+------------+---------+-----------+----------+-------+ Cephalic      Full                                          +----------+------------+---------+-----------+----------+-------+  Basilic       Full                                          +----------+------------+---------+-----------+----------+-------+  Summary:  Left: No evidence of deep vein thrombosis in the upper extremity. No evidence of superficial vein thrombosis in the upper extremity.  *See table(s) above for measurements and observations.  Diagnosing physician: Harold Barban MD Electronically signed by Harold Barban MD on 07/20/2020 at 9:25:54 PM.    Final      LOS: 2 days   Antonieta Pert, MD [facility]  07/21/2020, 7:32 AM

## 2020-07-21 NOTE — Progress Notes (Signed)
Message to provider regarding advancing diet from clear liquid.

## 2020-07-21 NOTE — Progress Notes (Signed)
RT note. Pt. Not being placed on CPAP at this time due to previous nights event. Discussed with RN, will continue to monitor and will place pt. On CPAP if required.

## 2020-07-21 NOTE — Progress Notes (Signed)
[facility] Progress Note  Ruben Walker 80 y.o. 07-13-1940   Subjective: Patient resting in bed. Denies abdominal pain.  Objective: Vital signs: Vitals:   07/21/20 0512 07/21/20 0926  BP: (!) 121/48 (!) 112/53  Pulse: 70 70  Resp: 15 17  Temp: 98.4 F (36.9 C) 98.2 F (36.8 C)  SpO2: 97% 99%    Physical Exam: Gen: lethargic, elderly, frail, no acute distress  HEENT: anicteric sclera CV: RRR Chest: CTA B Abd: soft, nontender, nondistended, +BS Ext: no edema  Lab Results: Recent Labs    07/20/20 0729 07/21/20 0304  NA 130* 133*  K 4.0 4.4  CL 99 104  CO2 18* 18*  GLUCOSE 96 103*  BUN 77* 65*  CREATININE 1.49* 1.41*  CALCIUM 9.5 9.3   Recent Labs    07/19/20 1254  AST 27  ALT 21  ALKPHOS 96  BILITOT 0.9  PROT 6.3*  ALBUMIN 1.8*   Recent Labs    07/19/20 1254 07/20/20 0729 07/20/20 1328 07/21/20 0304  WBC 16.0* 14.5*  --  12.1*  NEUTROABS 13.5*  --   --   --   HGB 7.6* 7.2* 7.5* 7.3*  HCT 24.6* 22.0* 24.0* 23.7*  MCV 84.5 81.2  --  83.7  PLT 436* 407*  --  400      Assessment/Plan: Anemia - multifactorial source but no overt GI bleeding. Hgb stable at 7.3. Advancing diet ok per GI. No new GI recs. No plans for endoscopic procedures. Will sign off. Call if questions. F/U with GI prn.   Ruben Walker 07/21/2020, 2:59 PM  Questions please call [phone] ID: Ruben Walker, male   DOB: [birth year], 80 y.o.   MRN: [id number]

## 2020-07-21 NOTE — Progress Notes (Signed)
Pharmacy Antibiotic Note  Ruben Walker is a 80 y.o. male admitted on 07/19/2020 with sepsis.  Pharmacy has been consulted for vancomycin dosing.  The patient's SCr has trended down to 1.41, will adjust the Vancomycin dose again today.    Noted staph species in 2 of 4 blood cultures - likely just a contaminant but with ICD - MD should eval site to ensure looks clean.   Plan: - Adjust Vancomycin to 750 mg IV every 24 hours (eAUC 535, Vd 0.5, SCr 1.41) - Cont CTX per MD - Will continue to follow renal function, culture results, LOT, and antibiotic de-escalation plans   Temp (24hrs), Avg:98.2 F (36.8 C), Min:98 F (36.7 C), Max:98.4 F (36.9 C)  Recent Labs  Lab 07/19/20 1254 07/19/20 1608 07/19/20 2150 07/20/20 0729 07/21/20 0304  WBC 16.0*  --   --  14.5* 12.1*  CREATININE 1.89*  --   --  1.49* 1.41*  LATICACIDVEN  --  0.9 1.6  --   --     CrCl cannot be calculated (Unknown ideal weight.).    No Known Allergies  Vancomycin 3/1> Ceftriaxone 3/1>  3/1 BCx >> 2/4 staph species (likely contaminant) 3/1 MRSA PCR >> neg  Thank you for allowing pharmacy to be a part of this patients care.  Georgina Pillion, PharmD, BCPS Clinical Pharmacist Clinical phone for 07/21/2020: [phone] 07/21/2020 9:54 AM   **Pharmacist phone directory can now be found on [email or web address] ([email or web address]).  Listed under [facility].

## 2020-07-21 NOTE — Progress Notes (Signed)
ANTICOAGULATION CONSULT NOTE   Pharmacy Consult for Warfarin Indication: atrial fibrillation  No Known Allergies  Patient Measurements:    Vital Signs: Temp: 98.2 F (36.8 C) (03/03 0926) BP: 112/53 (03/03 0926) Pulse Rate: 70 (03/03 0926)  Labs: Recent Labs    07/19/20 1254 07/19/20 1300 07/20/20 0729 07/20/20 1328 07/21/20 0304 07/21/20 1519  HGB 7.6*  --  7.2* 7.5* 7.3* 6.7*  HCT 24.6*  --  22.0* 24.0* 23.7* 20.7*  PLT 436*  --  407*  --  400  --   LABPROT  --  24.3* 24.4*  --  21.3*  --   INR  --  2.3* 2.3*  --  1.9*  --   CREATININE 1.89*  --  1.49*  --  1.41*  --     CrCl cannot be calculated (Unknown ideal weight.).   Assessment: 61 YOM who presented on 3/1 with hypoxia and SOB. The patient was on warfarin PTA for hx Afib - held d/t concern for GIB w/ FOB+. Seen by GI but with stable Hgb - pharmacy now consulted to resume Warfarin dosing.   Admit INR 2.3 on PTA dose of 2.5 mg/day - given Vit K 1 mg x 1 on 3/2. INR today is SUBtherapeutic at 1.9 -hg 6.7 with down trend  Goal of Therapy:  INR 2-3 Monitor platelets by anticoagulation protocol: Yes   Plan:  -Hold coumadin due to Hg drop -Daily PT/INR -follow CBC   Harland German, PharmD Clinical Pharmacist **Pharmacist phone directory can now be found on [email or web address] ([email or web address]).  Listed under [facility].

## 2020-07-21 NOTE — Progress Notes (Signed)
ANTICOAGULATION CONSULT NOTE - Initial Consult  Pharmacy Consult for Warfarin Indication: atrial fibrillation  No Known Allergies  Patient Measurements:    Vital Signs: Temp: 98.2 F (36.8 C) (03/03 0926) BP: 112/53 (03/03 0926) Pulse Rate: 70 (03/03 0926)  Labs: Recent Labs    07/19/20 1254 07/19/20 1300 07/20/20 0729 07/20/20 1328 07/21/20 0304  HGB 7.6*  --  7.2* 7.5* 7.3*  HCT 24.6*  --  22.0* 24.0* 23.7*  PLT 436*  --  407*  --  400  LABPROT  --  24.3* 24.4*  --  21.3*  INR  --  2.3* 2.3*  --  1.9*  CREATININE 1.89*  --  1.49*  --  1.41*    CrCl cannot be calculated (Unknown ideal weight.).   Assessment: 55 YOM who presented on 3/1 with hypoxia and SOB. The patient was on warfarin PTA for hx Afib - held d/t concern for GIB w/ FOB+. Seen by GI but with stable Hgb - pharmacy now consulted to resume Warfarin dosing.   Admit INR 2.3 on PTA dose of 2.5 mg/day - given Vit K 1 mg x 1 on 3/2. INR today is SUBtherapeutic at 1.9  Goal of Therapy:  INR 2-3 Monitor platelets by anticoagulation protocol: Yes   Plan:  - Warfarin 2.5 mg x 1 dose at 1800 today - Daily PT/INR, CBC q72h - Will continue to monitor for any signs/symptoms of bleeding and will follow up with PT/INR in the a.m.    Thank you for allowing pharmacy to be a part of this patient's care.  Georgina Pillion, PharmD, BCPS Clinical Pharmacist Clinical phone for 07/21/2020: [phone] 07/21/2020 3:28 PM   **Pharmacist phone directory can now be found on [email or web address] ([email or web address]).  Listed under [facility].

## 2020-07-22 ENCOUNTER — Other Ambulatory Visit: Payer: Self-pay

## 2020-07-22 DIAGNOSIS — A419 Sepsis, unspecified organism: Secondary | ICD-10-CM | POA: Diagnosis not present

## 2020-07-22 LAB — TYPE AND SCREEN
ABO/RH(D): O POS
Antibody Screen: NEGATIVE
Unit division: 0
Unit division: 0

## 2020-07-22 LAB — BPAM RBC
Blood Product Expiration Date: 202204012359
Blood Product Expiration Date: 202204022359
ISSUE DATE / TIME: 202203012208
ISSUE DATE / TIME: 202203031658
Unit Type and Rh: 5100
Unit Type and Rh: 5100

## 2020-07-22 LAB — BASIC METABOLIC PANEL
Anion gap: 11 (ref 5–15)
BUN: 49 mg/dL — ABNORMAL HIGH (ref 8–23)
CO2: 21 mmol/L — ABNORMAL LOW (ref 22–32)
Calcium: 9.7 mg/dL (ref 8.9–10.3)
Chloride: 100 mmol/L (ref 98–111)
Creatinine, Ser: 1.16 mg/dL (ref 0.61–1.24)
GFR, Estimated: >60 mL/min (ref >60)
Glucose, Bld: 71 mg/dL (ref 70–99)
Potassium: 4.1 mmol/L (ref 3.5–5.1)
Sodium: 132 mmol/L — ABNORMAL LOW (ref 135–145)

## 2020-07-22 LAB — CULTURE, BLOOD (ROUTINE X 2)

## 2020-07-22 LAB — CBC
HCT: 28.5 % — ABNORMAL LOW (ref 39.0–52.0)
HCT: 26.5 % — ABNORMAL LOW (ref 39.0–52.0)
Hemoglobin: 9.6 g/dL — ABNORMAL LOW (ref 13.0–17.0)
Hemoglobin: 8.2 g/dL — ABNORMAL LOW (ref 13.0–17.0)
MCH: 28 pg (ref 26.0–34.0)
MCH: 26.4 pg (ref 26.0–34.0)
MCHC: 33.7 g/dL (ref 30.0–36.0)
MCHC: 30.9 g/dL (ref 30.0–36.0)
MCV: 83.1 fL (ref 80.0–100.0)
MCV: 85.2 fL (ref 80.0–100.0)
Platelets: 368 10*3/uL (ref 150–400)
Platelets: 336 10*3/uL (ref 150–400)
RBC: 3.43 MIL/uL — ABNORMAL LOW (ref 4.22–5.81)
RBC: 3.11 MIL/uL — ABNORMAL LOW (ref 4.22–5.81)
RDW: 15.3 % (ref 11.5–15.5)
RDW: 15.3 % (ref 11.5–15.5)
WBC: 30.1 10*3/uL — ABNORMAL HIGH (ref 4.0–10.5)
WBC: 12.4 10*3/uL — ABNORMAL HIGH (ref 4.0–10.5)
nRBC: 0 % (ref 0.0–0.2)
nRBC: 0 % (ref 0.0–0.2)

## 2020-07-22 LAB — PROTIME-INR
INR: 1.8 — ABNORMAL HIGH (ref 0.8–1.2)
Prothrombin Time: 19.8 seconds — ABNORMAL HIGH (ref 11.4–15.2)

## 2020-07-22 MED ORDER — ADULT MULTIVITAMIN W/MINERALS CH
1.0000 | ORAL_TABLET | Freq: Every day | ORAL | Status: DC
  Administered 2020-07-22 – 2020-07-26 (×5): 1 via ORAL
  Filled 2020-07-22 (×5): qty 1

## 2020-07-22 MED ORDER — PROSOURCE PLUS PO LIQD
30.0000 mL | Freq: Two times a day (BID) | ORAL | Status: DC
  Administered 2020-07-22 – 2020-07-26 (×10): 30 mL via ORAL
  Filled 2020-07-22 (×10): qty 30

## 2020-07-22 NOTE — Evaluation (Signed)
Occupational Therapy Evaluation Patient Details Name: Ruben Walker MRN: [id number] DOB: [birth year] Today's Date: 07/22/2020    History of Present Illness 70 yomale with history of HTN, HLD, chronic diastolic CHF, A. fib on Coumadin history of cardiac arrest s/p pacemaker/AICD, chronic respiratory failure on 2 L oxygen, CKD 3, and OSA on CPAP, poor historian on admission admitted with hypoxia and hypotension on 07/19/20.   Clinical Impression   PTA, pt has been in SNF setting in recent months, but prior to this, pt was living at home alone with frequent falls. Pt presents with severe deconditioning, pain in R knee with ROM and large pressure wound on bottom. Pt overall Max A x 2 for rolling side to side with RN/NT for wound care. Due to wound, pt will be unable to tolerate sitting OOB at this time. Pt likely unable to stand and will require lift for transfers. Pt requires up to Mod A for UB ADLs and Total A 1-2 for LB ADLs bed level. Plan to address UB ADLs, pressure relief strategy reinforcement, and UE strengthening with pt during next sessions. Recommend DC back to SNF setting for wound healing and gradual progression of ADL/transfer independence.     Follow Up Recommendations  SNF;Supervision/Assistance - 24 hour    Equipment Recommendations  Wheelchair (measurements OT);Wheelchair cushion (measurements OT);Hospital bed;Other (comment) (air cushion for wc pending wound healing, air mattress)    Recommendations for Other Services       Precautions / Restrictions Precautions Precautions: Fall;Other (comment) Precaution Comments: stage 4 sacral wound, R knee pain Restrictions Weight Bearing Restrictions: No      Mobility Bed Mobility Overal bed mobility: Needs Assistance Bed Mobility: Rolling Rolling: Max assist;+2 for physical assistance;+2 for safety/equipment         General bed mobility comments: Max A x 2 for rolling side to side. Pt able to assist with UE to reach for bed  rail, but requires extensive assist for LE mgmt due to R knee pain. Rolling completed with NT and RN for wound care and positioning in sidelying for pressure relief    Transfers                 General transfer comment: unable, will need maximove    Balance                                           ADL either performed or assessed with clinical judgement   ADL Overall ADL's : Needs assistance/impaired Eating/Feeding: Set up;Bed level   Grooming: Set up;Bed level   Upper Body Bathing: Moderate assistance;Bed level   Lower Body Bathing: Total assistance;Bed level   Upper Body Dressing : Minimal assistance;Bed level Upper Body Dressing Details (indicate cue type and reason): Min A for donning hospital gown in bed Lower Body Dressing: Total assistance;Bed level Lower Body Dressing Details (indicate cue type and reason): Total A for mgmt of B prevalon boots x 2 assist for RN to place bandage. Pt unable to lift LE, so +2 assist needed     Toileting- Clothing Manipulation and Hygiene: Total assistance;Bed level         General ADL Comments: Pt with severe debility, pain, and large area of skin breakdown on bottom. Requires extensive assist for LB ADLs at bed level     Vision Baseline Vision/History: Wears glasses Wears Glasses: At all times Patient Visual  Report: No change from baseline Vision Assessment?: No apparent visual deficits     Perception     Praxis      Pertinent Vitals/Pain Pain Assessment: Faces Faces Pain Scale: Hurts even more Pain Location: R knee with movement Pain Descriptors / Indicators: Grimacing;Guarding;Moaning Pain Intervention(s): Monitored during session;Repositioned;Limited activity within patient's tolerance     Hand Dominance Right   Extremity/Trunk Assessment Upper Extremity Assessment Upper Extremity Assessment: Generalized weakness   Lower Extremity Assessment Lower Extremity Assessment: Defer to PT  evaluation;RLE deficits/detail RLE Deficits / Details: hx of severe R knee pain with ROM   Cervical / Trunk Assessment Cervical / Trunk Assessment: Kyphotic   Communication Communication Communication: No difficulties   Cognition Arousal/Alertness: Awake/alert Behavior During Therapy: WFL for tasks assessed/performed Overall Cognitive Status: No family/caregiver present to determine baseline cognitive functioning                                 General Comments: A&Ox4, some decreased awareness of severity of his deficits   General Comments  BP taken by NT in room 93/66 lying in bed.    Exercises     Shoulder Instructions      Home Living Family/patient expects to be discharged to:: Skilled nursing facility                                 Additional Comments: from [place]. Prior to most recent admissions, pt was living at home, frequent falls (Dec 2021 or Jan 2022?)      Prior Functioning/Environment Level of Independence: Needs assistance        Comments: Has been at [facility] recently. pt reports not standing or "doing much of anything" at SNF. Likely hoyer lift        OT Problem List: Decreased strength;Decreased range of motion;Decreased activity tolerance;Impaired balance (sitting and/or standing);Decreased knowledge of precautions;Pain      OT Treatment/Interventions: Self-care/ADL training;Therapeutic exercise;Energy conservation;DME and/or AE instruction;Therapeutic activities;Patient/family education;Balance training    OT Goals(Current goals can be found in the care plan section) Acute Rehab OT Goals Patient Stated Goal: pain control of knee OT Goal Formulation: With patient Time For Goal Achievement: 08/05/20 Potential to Achieve Goals: Fair ADL Goals Pt Will Perform Upper Body Bathing: bed level;with set-up Pt Will Perform Upper Body Dressing: bed level;with set-up Pt/caregiver will Perform Home Exercise Program:  Increased strength;Both right and left upper extremity;With theraband;Independently;With written HEP provided Additional ADL Goal #1: Pt to be able to independently monitor and verbalize when pressure relief/changing positions needed to maximize wound healing Additional ADL Goal #2: Pt to demonstrate bed mobility at Min A as precursor to ADLs  OT Frequency: Min 2X/week   Barriers to D/C:            Co-evaluation              AM-PAC OT "6 Clicks" Daily Activity     Outcome Measure Help from another person eating meals?: A Little Help from another person taking care of personal grooming?: A Little Help from another person toileting, which includes using toliet, bedpan, or urinal?: Total Help from another person bathing (including washing, rinsing, drying)?: Total Help from another person to put on and taking off regular upper body clothing?: A Little Help from another person to put on and taking off regular lower body clothing?: Total 6 Click  Score: 12   End of Session Nurse Communication: Mobility status;Other (comment) (RN present)  Activity Tolerance: Patient limited by pain;Other (comment) (low BP, large wound on bottom) Patient left: in bed;with call bell/phone within reach  OT Visit Diagnosis: Other abnormalities of gait and mobility (R26.89);Muscle weakness (generalized) (M62.81);Pain Pain - Right/Left: Right Pain - part of body: Knee                Time: 6073-7106 OT Time Calculation (min): 21 min Charges:  OT General Charges $OT Visit: 1 Visit OT Evaluation $OT Eval Moderate Complexity: 1 Mod  Bradd Canary, OTR/L Acute Rehab Services Office: [phone]  Lorre Munroe 07/22/2020, 11:17 AM

## 2020-07-22 NOTE — Progress Notes (Signed)
PROGRESS NOTE    Ruben Walker  BPZ:[id number] DOB: 19-Apr-1941 DOA: 07/19/2020 PCP: Tammi Sou, MD   Brief Narrative: 34 yomale with history of HTN, HLD, chronic diastolic CHF, A. fib on Coumadin history of cardiac arrest s/ppacemaker/AICD, chronic respiratory failure on 2 L oxygen, CKD 3, and OSA on CPAP, poor historian on admission admitted with hypoxia and hypotension on 07/19/20.  History was mostly obtained from review of records and talking with his nurse over the phone. Seem not to be as normal self 2/28-was more lethargic. His O2 saturationswere noted to be as low as 77% on 2 L oxygen.Staff had tried bumping up the patient's oxygen to 3 L, but only had improvement in O2 saturations up to 85%. His systolic blood pressureswere initially reportedin the 60s with respirations in 30s.he also had worsening decubitus wound.   In the ED, afebrile, pulse 59-72, respirations 18-33, blood pressure 86/39-163/143, and O2 saturations currently maintained on 2 L of nasal cannula oxygen.  Labs significant for WBC 16, hemoglobin 7.6, platelets 436, sodium 126, chloride 95, CO2 18, BUN 83, creatinine 1.89, albumin 1.8, and BNP 271.9.  Chest x-ray was significant for cardiac enlargement with bronchiectatic changes.  Urinalysis was positive for small leukocytes, many bacteria, 11-20 WBCs.  Patient had been given 500 mL of IV fluids  Patient was admitted for hypoxia and hypotension concerning for sepsis- thought secondary to a sacral wound that appears to be infected. Noted to have AKI along with concern for complicated UTI due to chronic indwelling Foley. Placed on empiric antibiotics of vancomycin and Rocephin. Hemoglobin down to 7.6 g/dL with guaiac positive stools. GI have been formally consulted and patient ordered to be transfused 1 unit of PRBCs. GS consulted for wound. Also noted large area of bruising on left UE, [place] negative for dvt. H/h dropped needing 1 unit PRBC transfusion  3/3  Subjective: Seen and examined this morning.  He is alert awake feeding himself.  No new complaints, no pain on the left arm where there is significant bruise and more swollen than the right Significant bump in WBC count had transfusion yesterday.  But he has been afebrile appears more alert.  Assessment & Plan:  Sepsis secondary to infected sacral decubitus skin ulcer with cellulitis and/or complicated enterococcal UTI ESR CRP elevated, on empiric vancomycin Rocephin.  Urine culture with Enterococcus fecalis and blood culture with staph hominis-likely contamination.Remains on vancomycin and ceftriaxone.  Wound care consulted.  Discussed with Dr. Doristine Devoid with ID as patient's leukocytosis is worsening-we will monitor CBC later today and in a.m. keep on antibiotics for now-if has persistent leukocytosis/fever we will consider imaging/scanning him. Surgery has seen the wound and does not feel he needs any debridement at this time.   Recent Labs  Lab 07/19/20 1254 07/19/20 1608 07/19/20 2150 07/20/20 0729 07/21/20 0304 07/22/20 0441  WBC 16.0*  --   --  14.5* 12.1* 30.1*  LATICACIDVEN  --  0.9 1.6  --   --   --    Anemia likely multifactorial possible acute blood loss anemia 2/2 possible GI bleed versus large bruise on the left upper extremity, FOBT positive: Likely bleeding in his left arm . so far received 2 unit PRBC last 3/3.  Hemoglobin appropriately increased.  GI does not feel he has GI bleeding-and okay to continue Coumadin.  Monitor h/h. Left arm NEG for DVT on the ultrasound.  No active GI bleed as per GI and not pursuing invasive endoscopic procedure at this time  Recent Labs  Lab 07/20/20 1328 07/21/20 0304 07/21/20 1519 07/21/20 2219 07/22/20 0441  HGB 7.5* 7.3* 6.7* 9.2* 9.6*  HCT 24.0* 23.7* 20.7* 28.9* 28.5*   Left upper extremity bruise ?etiology-?from BP cuff in the setting of Coumadin versus other.??.  Negative for duplex DVT.  He has no pain.  Monitor  arm.  Sacral decubitus ulcer stage IV POA: Seen by surgery-appears clean area of necrosis will slow up and per CCS no indication for debridement and has advised WTD bid dressing changes and more frequent for soiling. Has a small area of necrotic tissue on the edge and will likely slough off.  Wound care following  Acute on chronic hypoxic respiratory failure, hypoxic on presentation chest x-ray chronic bronchitis changes, continue supplemental oxygen, bronchodilators.  Her respiratory status is stable.  AKI on CKD stage IIIb, creatinine past year baseline around 1.8, improved to 1.1 Recent Labs    06/19/20 0217 06/20/20 0748 06/21/20 0315 06/22/20 0724 06/23/20 0319 06/24/20 0619 07/19/20 1254 07/20/20 0729 07/21/20 0304 07/22/20 0441  BUN 77* 77* 81* 75* 71* 56* 83* 77* 65* 49*  CREATININE 2.32* 1.87* 1.87* 1.67* 1.69* 1.35* 1.89* 1.49* 1.41* 1.16   Permanent atrial fibrillation on Coumadin.Possibly stopping anticoagulation have been discussed due to the patient's risk of falls, but he did not want to stop the medication due to the possible risk of stroke.  Coumadin was held due to concern for acute bleeding-Per GI no active GI bleeding and okay to resume based on risk benefits.  Given his significant drop in hemoglobin holding Coumadin will resume from tomorrow if remains stable INR is 1.8 Recent Labs  Lab 07/19/20 1300 07/20/20 0729 07/21/20 0304 07/22/20 0441  INR 2.3* 2.3* 1.9* 1.8*   Chronic diastolic CHF: Euvolemic BNP 271, monitor intake output Daily weight Net IO Since Admission: 5,580.68 mL [07/22/20 0912]  Hyponatremia 126 on admission suspected hypovolemic.  Improved with fluids. Recent Labs  Lab 07/19/20 1254 07/20/20 0729 07/21/20 0304 07/22/20 0441  NA 126* 130* 133* 132*   History of cardiac arrest post AICD/pacemaker History of COVID infection 06/14/2020 Generalized weakness/debility/deconditioning continue PT OT.  Has had frequent falls.  Been basically  bedbound.  Encourage PT OT  OSA-continue CPAP/?bipapa at snf  Severe protein calorie malnutrition-see below 1.2 prealbumin 5 continue to augment diet.  At risk of   Diet Order            DIET SOFT Room service appropriate? Yes; Fluid consistency: Thin  Diet effective now                Pressure Injury 07/20/20 Sacrum Unstageable - Full thickness tissue loss in which the base of the injury is covered by slough (yellow, tan, gray, green or brown) and/or eschar (tan, brown or black) in the wound bed. Tunnelling, gray eschar (Active)  07/20/20 0021  Location: Sacrum  Location Orientation:   Staging: Unstageable - Full thickness tissue loss in which the base of the injury is covered by slough (yellow, tan, gray, green or brown) and/or eschar (tan, brown or black) in the wound bed.  Wound Description (Comments): Tunnelling, gray eschar  Present on Admission: Yes     Pressure Injury 07/20/20 Buttocks Right Stage 2 -  Partial thickness loss of dermis presenting as a shallow open injury with a red, pink wound bed without slough. (Active)  07/20/20 0026  Location: Buttocks  Location Orientation: Right  Staging: Stage 2 -  Partial thickness loss of dermis presenting as a shallow  open injury with a red, pink wound bed without slough.  Wound Description (Comments):   Present on Admission: Yes     Pressure Injury 07/20/20 Buttocks Left Stage 2 -  Partial thickness loss of dermis presenting as a shallow open injury with a red, pink wound bed without slough. (Active)  07/20/20 0027  Location: Buttocks  Location Orientation: Left  Staging: Stage 2 -  Partial thickness loss of dermis presenting as a shallow open injury with a red, pink wound bed without slough.  Wound Description (Comments):   Present on Admission: Yes     Pressure Injury 07/20/20 Heel Left Deep Tissue Pressure Injury - Purple or maroon localized area of discolored intact skin or blood-filled blister due to damage of underlying  soft tissue from pressure and/or shear. (Active)  07/20/20 0032  Location: Heel  Location Orientation: Left  Staging: Deep Tissue Pressure Injury - Purple or maroon localized area of discolored intact skin or blood-filled blister due to damage of underlying soft tissue from pressure and/or shear.  Wound Description (Comments):   Present on Admission: Yes   DVT prophylaxis: SCDs Start: 07/19/20 1557 coumadin Code Status:   Code Status: DNR  Family Communication: plan of care discussed with patient at bedside. I have updated patient's nephew over the phone. Status is: Inpatient Remains inpatient appropriate because:Inpatient level of care appropriate due to severity of illness  Dispo: The patient is from: Home              Anticipated d/c is to: SNF.  Likely next 2 to 3 days PTOT eval              Patient currently is not medically stable to d/c.   Difficult to place patient No   Unresulted Labs (From admission, onward)          Start     Ordered   07/22/20 9417  Basic metabolic panel  Daily,   R     Question:  Specimen collection method  Answer:  Lab=Lab collect   07/21/20 0748   07/22/20 0500  CBC  Daily,   R     Question:  Specimen collection method  Answer:  Lab=Lab collect   07/21/20 0748   07/20/20 0500  Protime-INR  Daily,   R      07/19/20 1716          Medications reviewed:  Scheduled Meds: . (feeding supplement) PROSource Plus  30 mL Oral BID BM  . Chlorhexidine Gluconate Cloth  6 each Topical Daily  . feeding supplement  237 mL Oral TID BM  . multivitamin with minerals  1 tablet Oral Daily  . pantoprazole (PROTONIX) IV  40 mg Intravenous Q24H  . sodium chloride flush  10-40 mL Intracatheter Q12H  . sodium chloride flush  3 mL Intravenous Q12H  . traMADol  50 mg Oral Q12H   Continuous Infusions: . sodium chloride 75 mL/hr at 07/22/20 0200  . cefTRIAXone (ROCEPHIN)  IV 1 g (07/21/20 2114)  . vancomycin Stopped (07/21/20 1245)    Consultants:see note   Procedures:see note  Antimicrobials: Anti-infectives (From admission, onward)   Start     Dose/Rate Route Frequency Ordered Stop   07/21/20 1800  vancomycin (VANCOREADY) IVPB 1250 mg/250 mL  Status:  Discontinued        1,250 mg 166.7 mL/hr over 90 Minutes Intravenous Every 48 hours 07/20/20 0901 07/21/20 0944   07/21/20 1100  vancomycin (VANCOREADY) IVPB 750 mg/150 mL  750 mg 150 mL/hr over 60 Minutes Intravenous Every 24 hours 07/21/20 0944     07/19/20 2110  vancomycin variable dose per unstable renal function (pharmacist dosing)  Status:  Discontinued         Does not apply See admin instructions 07/19/20 2110 07/21/20 0944   07/19/20 1730  vancomycin (VANCOREADY) IVPB 1750 mg/350 mL        1,750 mg 175 mL/hr over 120 Minutes Intravenous  Once 07/19/20 1631 07/19/20 2111   07/19/20 1645  cefTRIAXone (ROCEPHIN) 1 g in sodium chloride 0.9 % 100 mL IVPB        1 g 200 mL/hr over 30 Minutes Intravenous Every 24 hours 07/19/20 1631       Culture/Microbiology    Component Value Date/Time   SDES URINE, RANDOM 07/20/2020 2251   SPECREQUEST NONE 07/20/2020 2251   CULT  07/20/2020 2251    CULTURE REINCUBATED FOR BETTER GROWTH Performed at [facility] [address], [place], [place] [address]    REPTSTATUS PENDING 07/20/2020 2251    Other culture-see note  Objective: Vitals: Today's Vitals   07/21/20 1704 07/21/20 1725 07/21/20 2041 07/22/20 0529  BP: (!) 150/136 (!) 104/57 (!) 115/56 (!) 128/109  Pulse: 70 70 70 70  Resp: '18 16 17 16  ' Temp: 98.5 F (36.9 C) 97.7 F (36.5 C) (!) 97.4 F (36.3 C) 98.7 F (37.1 C)  TempSrc:  Oral Oral   SpO2: 100% 97% 100% 100%  PainSc:   Asleep     Intake/Output Summary (Last 24 hours) at 07/22/2020 0912 Last data filed at 07/22/2020 0700 Gross per 24 hour  Intake 3439.46 ml  Output 1825 ml  Net 1614.46 ml   There were no vitals filed for this visit. Weight change:   Intake/Output from previous day: 03/03 0701 - 03/04  0700 In: 3679.5 [P.O.:940; I.V.:1622.5; Blood:717; IV Piggyback:400] Out: 9528 [Urine:1825] Intake/Output this shift: No intake/output data recorded. There were no vitals filed for this visit.  Examination:  General exam: AAO elderly frail, interactive communicative,NAD, weak appearing. HEENT:Oral mucosa moist, Ear/Nose WNL grossly, dentition normal. Respiratory system: bilaterally diminishedd,no wheezing or crackles,no use of accessory muscle Cardiovascular system: S1 & S2 +, No JVD,. Gastrointestinal system: Abdomen soft, NT,ND, BS+ Nervous System:Alert, awake, moving extremities and grossly nonfocal Extremities: Bruise left arm entire upper arm- no edema, distal peripheral pulses palpable.  Skin: No rashes,no icterus. MSK: Normal muscle bulk,tone, power Sacral decubitus present as below    Data Reviewed: I have personally reviewed following labs and imaging studies CBC: Recent Labs  Lab 07/19/20 1254 07/20/20 0729 07/20/20 1328 07/21/20 0304 07/21/20 1519 07/21/20 2219 07/22/20 0441  WBC 16.0* 14.5*  --  12.1*  --   --  30.1*  NEUTROABS 13.5*  --   --   --   --   --   --   HGB 7.6* 7.2* 7.5* 7.3* 6.7* 9.2* 9.6*  HCT 24.6* 22.0* 24.0* 23.7* 20.7* 28.9* 28.5*  MCV 84.5 81.2  --  83.7  --   --  83.1  PLT 436* 407*  --  400  --   --  413   Basic Metabolic Panel: Recent Labs  Lab 07/19/20 1254 07/20/20 0729 07/21/20 0304 07/22/20 0441  NA 126* 130* 133* 132*  K 5.1 4.0 4.4 4.1  CL 95* 99 104 100  CO2 18* 18* 18* 21*  GLUCOSE 102* 96 103* 71  BUN 83* 77* 65* 49*  CREATININE 1.89* 1.49* 1.41* 1.16  CALCIUM 9.6 9.5 9.3 9.7   GFR: CrCl cannot be calculated (Unknown ideal weight.). Liver Function Tests: Recent Labs  Lab 07/19/20 1254  AST 27  ALT 21  ALKPHOS 96  BILITOT 0.9  PROT 6.3*  ALBUMIN 1.8*   No results for input(s): LIPASE, AMYLASE in the last 168 hours. No results for input(s): AMMONIA in the last 168 hours. Coagulation Profile: Recent Labs   Lab 07/19/20 1300 07/20/20 0729 07/21/20 0304 07/22/20 0441  INR 2.3* 2.3* 1.9* 1.8*   Cardiac Enzymes: No results for input(s): CKTOTAL, CKMB, CKMBINDEX, TROPONINI in the last 168 hours. BNP (last 3 results) No results for input(s): PROBNP in the last 8760 hours. HbA1C: No results for input(s): HGBA1C in the last 72 hours. CBG: No results for input(s): GLUCAP in the last 168 hours. Lipid Profile: No results for input(s): CHOL, HDL, LDLCALC, TRIG, CHOLHDL, LDLDIRECT in the last 72 hours. Thyroid Function Tests: No results for input(s): TSH, T4TOTAL, FREET4, T3FREE, THYROIDAB in the last 72 hours. Anemia Panel: No results for input(s): VITAMINB12, FOLATE, FERRITIN, TIBC, IRON, RETICCTPCT in the last 72 hours. Sepsis Labs: Recent Labs  Lab 07/19/20 1608 07/19/20 2150  LATICACIDVEN 0.9 1.6    Recent Results (from the past 240 hour(s))  Culture, blood (x 2)     Status: Abnormal   Collection Time: 07/19/20  5:37 PM   Specimen: BLOOD  Result Value Ref Range Status   Specimen Description BLOOD  Final   Special Requests   Final    RIGHT IJ BOTTLES DRAWN AEROBIC AND ANAEROBIC Blood Culture results may not be optimal due to an inadequate volume of blood received in culture bottles   Culture  Setup Time   Final    GRAM POSITIVE COCCI IN CLUSTERS IN BOTH AEROBIC AND ANAEROBIC BOTTLES Organism ID to follow CRITICAL RESULT CALLED TO, READ BACK BY AND VERIFIED WITH: H VON [facility] PHARMD 2056 07/20/20 A BROWNING    Culture (A)  Final    STAPHYLOCOCCUS HOMINIS THE SIGNIFICANCE OF ISOLATING THIS ORGANISM FROM A SINGLE SET OF BLOOD CULTURES WHEN MULTIPLE SETS ARE DRAWN IS UNCERTAIN. PLEASE NOTIFY THE MICROBIOLOGY DEPARTMENT WITHIN ONE WEEK IF SPECIATION AND SENSITIVITIES ARE REQUIRED. Performed at [facility], [place] [address], [place], [place] [address]    Report Status 07/22/2020 FINAL  Final  Blood Culture ID Panel (Reflexed)     Status: Abnormal   Collection Time: 07/19/20   5:37 PM  Result Value Ref Range Status   Enterococcus faecalis NOT DETECTED NOT DETECTED Final   Enterococcus Faecium NOT DETECTED NOT DETECTED Final   Listeria monocytogenes NOT DETECTED NOT DETECTED Final   Staphylococcus species DETECTED (A) NOT DETECTED Final    Comment: CRITICAL RESULT CALLED TO, READ BACK BY AND VERIFIED WITH: H VON [facility] PHARMD 2056 07/20/20 A BROWNING    Staphylococcus aureus (BCID) NOT DETECTED NOT DETECTED Final   Staphylococcus epidermidis NOT DETECTED NOT DETECTED Final   Staphylococcus lugdunensis NOT DETECTED NOT DETECTED Final   Streptococcus species NOT DETECTED NOT DETECTED Final   Streptococcus agalactiae NOT DETECTED NOT DETECTED Final   Streptococcus pneumoniae NOT DETECTED NOT DETECTED Final   Streptococcus pyogenes NOT DETECTED NOT DETECTED Final   A.calcoaceticus-baumannii NOT DETECTED NOT DETECTED Final   Bacteroides fragilis NOT DETECTED NOT DETECTED Final   Enterobacterales NOT DETECTED NOT DETECTED Final   Enterobacter cloacae complex NOT DETECTED NOT DETECTED Final   Escherichia coli NOT DETECTED NOT DETECTED Final   Klebsiella aerogenes NOT DETECTED NOT DETECTED  Final   Klebsiella oxytoca NOT DETECTED NOT DETECTED Final   Klebsiella pneumoniae NOT DETECTED NOT DETECTED Final   Proteus species NOT DETECTED NOT DETECTED Final   Salmonella species NOT DETECTED NOT DETECTED Final   Serratia marcescens NOT DETECTED NOT DETECTED Final   Haemophilus influenzae NOT DETECTED NOT DETECTED Final   Neisseria meningitidis NOT DETECTED NOT DETECTED Final   Pseudomonas aeruginosa NOT DETECTED NOT DETECTED Final   Stenotrophomonas maltophilia NOT DETECTED NOT DETECTED Final   Candida albicans NOT DETECTED NOT DETECTED Final   Candida auris NOT DETECTED NOT DETECTED Final   Candida glabrata NOT DETECTED NOT DETECTED Final   Candida krusei NOT DETECTED NOT DETECTED Final   Candida parapsilosis NOT DETECTED NOT DETECTED Final   Candida tropicalis NOT  DETECTED NOT DETECTED Final   Cryptococcus neoformans/gattii NOT DETECTED NOT DETECTED Final    Comment: Performed at [facility], [place] [address] [place], [place] [address]  MRSA PCR Screening     Status: None   Collection Time: 07/19/20  9:43 PM   Specimen: Nasal Mucosa; Nasopharyngeal  Result Value Ref Range Status   MRSA by PCR NEGATIVE NEGATIVE Final    Comment:        The GeneXpert MRSA Assay (FDA approved for NASAL specimens only), is one component of a comprehensive MRSA colonization surveillance program. It is not intended to diagnose MRSA infection nor to guide or monitor treatment for MRSA infections. Performed at [facility], [place] [address] [place], [place] [address]   Culture, blood (x 2)     Status: None (Preliminary result)   Collection Time: 07/19/20  9:48 PM   Specimen: BLOOD  Result Value Ref Range Status   Specimen Description BLOOD RIGHT ANTECUBITAL  Final   Special Requests   Final    BOTTLES DRAWN AEROBIC AND ANAEROBIC Blood Culture results may not be optimal due to an inadequate volume of blood received in culture bottles   Culture   Final    NO GROWTH 3 DAYS Performed at [facility], [place] [address] [place], [place] [address]    Report Status PENDING  Incomplete  Culture, Urine     Status: None (Preliminary result)   Collection Time: 07/20/20 10:51 PM   Specimen: Urine, Random  Result Value Ref Range Status   Specimen Description URINE, RANDOM  Final   Special Requests NONE  Final   Culture   Final    CULTURE REINCUBATED FOR BETTER GROWTH Performed at [facility], [place] [address], [place],  [address]    Report Status PENDING  Incomplete     Radiology Studies: VAS [place] UPPER EXTREMITY VENOUS DUPLEX  Result Date: 07/20/2020 UPPER VENOUS STUDY  Indications: upper arm bruise Limitations: Patient unable to position properly for exam. Comparison Study: no previous Performing Technologist: Vonzell Schlatter RVT  Examination  Guidelines: A complete evaluation includes B-mode imaging, spectral Doppler, color Doppler, and power Doppler as needed of all accessible portions of each vessel. Bilateral testing is considered an integral part of a complete examination. Limited examinations for reoccurring indications may be performed as noted.  Left Findings: +----------+------------+---------+-----------+----------+-------+ LEFT      CompressiblePhasicitySpontaneousPropertiesSummary +----------+------------+---------+-----------+----------+-------+ IJV           Full       Yes       Yes                      +----------+------------+---------+-----------+----------+-------+ Subclavian  Yes       Yes                      +----------+------------+---------+-----------+----------+-------+ Axillary      Full       Yes       Yes                      +----------+------------+---------+-----------+----------+-------+ Brachial      Full                                          +----------+------------+---------+-----------+----------+-------+ Radial        Full                                          +----------+------------+---------+-----------+----------+-------+ Ulnar         Full                                          +----------+------------+---------+-----------+----------+-------+ Cephalic      Full                                          +----------+------------+---------+-----------+----------+-------+ Basilic       Full                                          +----------+------------+---------+-----------+----------+-------+  Summary:  Left: No evidence of deep vein thrombosis in the upper extremity. No evidence of superficial vein thrombosis in the upper extremity.  *See table(s) above for measurements and observations.  Diagnosing physician: Harold Barban MD Electronically signed by Harold Barban MD on 07/20/2020 at 9:25:54 PM.    Final      LOS: 3 days    Antonieta Pert, MD [facility]  07/22/2020, 9:12 AM

## 2020-07-22 NOTE — Evaluation (Signed)
Physical Therapy Evaluation and Discharge Patient Details Name: Ruben Walker MRN: [id number] DOB: 1940/08/01 Today's Date: 07/22/2020   History of Present Illness  Pt is a 80 yo male  admitted 3/1 secondary to sepsis likely from infected sacral wound. PMH includes HTN, HLD, chronic diastolic CHF, A. fib, cardiac arrest s/p pacemaker/AICD, chronic respiratory failure on 2 L oxygen, CKD 3, and OSA on CPAP.  Clinical Impression  Pt admitted secondary to problem above with deficits below. Pt requiring total A for repositioning and bed mobility tasks this session. Pt reports he is from [place] and has been in the bed mostly. Anticipate pt is close to baseline and all further needs can be deferred to SNF. Of note, pt does wish to go to another SNF if possible. No further acute PT needs. Will sign off. If needs change, please re-consult.     Follow Up Recommendations SNF;Supervision/Assistance - 24 hour    Equipment Recommendations  None recommended by PT    Recommendations for Other Services       Precautions / Restrictions Precautions Precautions: Fall;Other (comment) Precaution Comments: stage 4 sacral wound, R knee pain Restrictions Weight Bearing Restrictions: No      Mobility  Bed Mobility Overal bed mobility: Needs Assistance Bed Mobility: Rolling Rolling: Total assist         General bed mobility comments: Total A +2 for repositioning and rolling to side for pressure relief. Pt reaching for rail, but only assisting minimally.    Transfers                    Ambulation/Gait                Stairs            Wheelchair Mobility    Modified Rankin (Stroke Patients Only)       Balance                                             Pertinent Vitals/Pain Pain Assessment: Faces Faces Pain Scale: Hurts even more Pain Location: R knee with movement Pain Descriptors / Indicators: Grimacing;Guarding;Moaning Pain  Intervention(s): Limited activity within patient's tolerance;Monitored during session;Repositioned    Home Living Family/patient expects to be discharged to:: Skilled nursing facility                 Additional Comments: from [place].    Prior Function Level of Independence: Needs assistance   Gait / Transfers Assistance Needed: Pt reports he has been in the bed mostly since going to [employer].  ADL's / Homemaking Assistance Needed: Total A for ADLs        Hand Dominance   Dominant Hand: Right    Extremity/Trunk Assessment   Upper Extremity Assessment Upper Extremity Assessment: Defer to OT evaluation    Lower Extremity Assessment Lower Extremity Assessment: Generalized weakness;RLE deficits/detail RLE Deficits / Details: R knee pain with ROM; pt grimacing and guarding throughout.    Cervical / Trunk Assessment Cervical / Trunk Assessment: Kyphotic  Communication   Communication: No difficulties  Cognition Arousal/Alertness: Awake/alert Behavior During Therapy: WFL for tasks assessed/performed Overall Cognitive Status: No family/caregiver present to determine baseline cognitive functioning  General Comments: Questionable memory deficits as he reports he was at home prior to admission. Was able to correct himself with cuing from PT.      General Comments      Exercises     Assessment/Plan    PT Assessment All further PT needs can be met in the next venue of care  PT Problem List Decreased strength;Decreased range of motion;Decreased balance;Decreased activity tolerance;Decreased mobility;Decreased knowledge of use of DME;Decreased safety awareness;Decreased knowledge of precautions;Pain       PT Treatment Interventions      PT Goals (Current goals can be found in the Care Plan section)  Acute Rehab PT Goals Patient Stated Goal: For R knee to stop hurting PT Goal Formulation: With patient Time  For Goal Achievement: 07/22/20 Potential to Achieve Goals: Fair    Frequency     Barriers to discharge        Co-evaluation               AM-PAC PT "6 Clicks" Mobility  Outcome Measure Help needed turning from your back to your side while in a flat bed without using bedrails?: Total Help needed moving from lying on your back to sitting on the side of a flat bed without using bedrails?: Total Help needed moving to and from a bed to a chair (including a wheelchair)?: Total Help needed standing up from a chair using your arms (e.g., wheelchair or bedside chair)?: Total Help needed to walk in hospital room?: Total Help needed climbing 3-5 steps with a railing? : Total 6 Click Score: 6    End of Session   Activity Tolerance: Patient tolerated treatment well Patient left: in bed;with call bell/phone within reach;with nursing/sitter in room Nurse Communication: Mobility status PT Visit Diagnosis: Difficulty in walking, not elsewhere classified (R26.2);Muscle weakness (generalized) (M62.81);Pain Pain - Right/Left: Right Pain - part of body: Knee    Time: 0600-4599 PT Time Calculation (min) (ACUTE ONLY): 12 min   Charges:   PT Evaluation $PT Eval Moderate Complexity: 1 Mod          Reuel Derby, PT, DPT  Acute Rehabilitation Services  Pager: [phone] Office: [phone]   Rudean Hitt 07/22/2020, 4:51 PM

## 2020-07-22 NOTE — Plan of Care (Signed)
  Problem: Nutrition: Goal: Adequate nutrition will be maintained Outcome: Progressing   Problem: Activity: Goal: Risk for activity intolerance will decrease Outcome: Not Progressing   

## 2020-07-22 NOTE — Progress Notes (Signed)
Initial Nutrition Assessment  DOCUMENTATION CODES:   Severe malnutrition in context of chronic illness  INTERVENTION:   Please obtain updated weight   Magic cup TID with meals, each supplement provides 290 kcal and 9 grams of protein  65ml Prosource Plus po BID, each supplement provides 100 kcals and 15 grams of protein  MVI daily  Continue Ensure Enlive po TID, each supplement provides 350 kcal and 20 grams of protein   NUTRITION DIAGNOSIS:   Severe Malnutrition related to chronic illness as evidenced by severe muscle depletion,severe fat depletion.    GOAL:   Patient will meet greater than or equal to 90% of their needs   MONITOR:   PO intake,Diet advancement,Skin,Supplement acceptance,Weight trends,Labs,I & O's  REASON FOR ASSESSMENT:   Consult Wound healing,Assessment of nutrition requirement/status  ASSESSMENT:   Pt admitted with sepsis 2/2 infected sacral decubitus skin ulcer with cellulitis and/or complicated UTI. PMH includes HTN, CHF, Afib, h/o cardiac arrest s/p pacemaker/AICD, chronic respiratory failure on 2L O2, CKD3, and OSA on CPAP.   Surgery evaluated pt and, at this time, no plan for surgical intervention.   MD in room at time of visit.  Pt is a poor historian. Unable to obtain any meaningful diet/weight history at this time.   Pt with poor po intake. Pt had previously been on a clear liquid diet accepting 0-10%. Diet was advanced yesterday by GI to soft; pt still noted to have 0-10% meal completion. Pt already with orders for Ensure TID and agreeable to continuing  these. Will order additional supplements.   Attempted to review weight history. No updated weight for this admission. Last weight obtained 03/28/20, 87.1 kg. Will use this to estimate pt's needs. Please obtain updated weight. Additionally, question if pt's documented height (5'2") is accurate, but pt unable to provide answer at this time.    UOP: x24 hours  Medications:  warfarin Labs: Na 132 (L)  NUTRITION - FOCUSED PHYSICAL EXAM:  Flowsheet Row Most Recent Value  Orbital Region Severe depletion  Upper Arm Region Moderate depletion  [edematous and very bruised, painful to palpate]  Thoracic and Lumbar Region Mild depletion  Buccal Region Moderate depletion  Temple Region Mild depletion  Clavicle Bone Region Mild depletion  Clavicle and Acromion Bone Region Moderate depletion  Scapular Bone Region Moderate depletion  Dorsal Hand Severe depletion  Patellar Region Severe depletion  Anterior Thigh Region Severe depletion  Posterior Calf Region Severe depletion  Edema (RD Assessment) Moderate  Hair Other (Comment)  [thinning, hair loss]  Eyes Reviewed  Mouth Reviewed  Skin Other (Comment)  [dry]  Nails Reviewed     Note edema likely masking some depletions.   Diet Order:   Diet Order            DIET SOFT Room service appropriate? Yes; Fluid consistency: Thin  Diet effective now                 EDUCATION NEEDS:   Not appropriate for education at this time  Skin:  Skin Assessment: Skin Integrity Issues: Skin Integrity Issues:: DTI,Stage II,Unstageable DTI: L heel Stage II: bilateral buttocks Unstageable: sacrum  Last BM:  PTA  Height:   Ht Readings from Last 1 Encounters:  02/22/20 5\' 2"  (1.575 m)    Weight:   Wt Readings from Last 1 Encounters:  03/28/20 87.1 kg    BMI:  There is no height or weight on file to calculate BMI.  Estimated Nutritional Needs:   Kcal:  2100-2300  Protein:  105-115 grams  Fluid:  >2L    Eugene Gavia, MS, RD, LDN RD pager number and weekend/on-call pager number located in Amion.

## 2020-07-22 NOTE — Consult Note (Addendum)
   [facility] Inpatient Consult   07/22/2020  Ruben Walker [birth year] [id number]  [facility] [[facility]]  Accountable Care Organization [ACO] Patient: Medicare DCE  PCP: Dr. Nicoletta Ba, [facility], listed for the [facility]  Patient screened for hospitalization with noted high risk score for unplanned readmission risk and with less than 30 days readmission.  Electronic Medical record reviewed  to assess for potential [facility]  [[facility]] Care Management service needs for post hospital transition.  Review of patient's medical record reveals patient is currently being recommended for a skilled nursing facility level of care for transition of care needs.  Patient lived alone [place]  per occupation therapy and progress notes, awaiting PT evaluation.  Plan:  Continue to follow progress and disposition to assess for post hospital care management needs.  If patient transitions to a [facility] affiliated facility, will alert the [facility] nurse.   For questions contact:   Charlesetta Shanks, RN BSN CCM [facility]  [phone] business mobile phone Toll free office [phone]  Fax number: [phone] [email or web address]

## 2020-07-23 DIAGNOSIS — L03319 Cellulitis of trunk, unspecified: Secondary | ICD-10-CM | POA: Diagnosis not present

## 2020-07-23 LAB — BASIC METABOLIC PANEL
Anion gap: 7 (ref 5–15)
BUN: 38 mg/dL — ABNORMAL HIGH (ref 8–23)
CO2: 19 mmol/L — ABNORMAL LOW (ref 22–32)
Calcium: 9.5 mg/dL (ref 8.9–10.3)
Chloride: 106 mmol/L (ref 98–111)
Creatinine, Ser: 1.01 mg/dL (ref 0.61–1.24)
GFR, Estimated: >60 mL/min (ref >60)
Glucose, Bld: 103 mg/dL — ABNORMAL HIGH (ref 70–99)
Potassium: 4 mmol/L (ref 3.5–5.1)
Sodium: 132 mmol/L — ABNORMAL LOW (ref 135–145)

## 2020-07-23 LAB — PROTIME-INR
INR: 1.7 — ABNORMAL HIGH (ref 0.8–1.2)
Prothrombin Time: 19.6 seconds — ABNORMAL HIGH (ref 11.4–15.2)

## 2020-07-23 LAB — URINE CULTURE: Culture: >=100000 — AB

## 2020-07-23 LAB — CBC
HCT: 23.8 % — ABNORMAL LOW (ref 39.0–52.0)
Hemoglobin: 7.4 g/dL — ABNORMAL LOW (ref 13.0–17.0)
MCH: 26.3 pg (ref 26.0–34.0)
MCHC: 31.1 g/dL (ref 30.0–36.0)
MCV: 84.7 fL (ref 80.0–100.0)
Platelets: 329 10*3/uL (ref 150–400)
RBC: 2.81 MIL/uL — ABNORMAL LOW (ref 4.22–5.81)
RDW: 15.2 % (ref 11.5–15.5)
WBC: 10 10*3/uL (ref 4.0–10.5)
nRBC: 0 % (ref 0.0–0.2)

## 2020-07-23 MED ORDER — VANCOMYCIN HCL 1000 MG/200ML IV SOLN
1000.0000 mg | INTRAVENOUS | Status: DC
  Administered 2020-07-23: 1000 mg via INTRAVENOUS
  Filled 2020-07-23 (×2): qty 200

## 2020-07-23 NOTE — Progress Notes (Signed)
PROGRESS NOTE    Ruben Walker  PRF:[id number] DOB: 13-Nov-1940 DOA: 07/19/2020 PCP: Tammi Sou, MD   Brief Narrative: 35 yomale with history of HTN, HLD, chronic diastolic CHF, A. fib on Coumadin history of cardiac arrest s/ppacemaker/AICD, chronic respiratory failure on 2 L oxygen, CKD 3, and OSA on CPAP, poor historian on admission admitted with hypoxia and hypotension on 07/19/20.  History was mostly obtained from review of records and talking with his nurse over the phone. Seem not to be as normal self 2/28-was more lethargic. His O2 saturationswere noted to be as low as 77% on 2 L oxygen.Staff had tried bumping up the patient's oxygen to 3 L, but only had improvement in O2 saturations up to 85%. His systolic blood pressureswere initially reportedin the 60s with respirations in 30s.he also had worsening decubitus wound.   In the ED, afebrile, pulse 59-72, respirations 18-33, blood pressure 86/39-163/143, and O2 saturations currently maintained on 2 L of nasal cannula oxygen.  Labs significant for WBC 16, hemoglobin 7.6, platelets 436, sodium 126, chloride 95, CO2 18, BUN 83, creatinine 1.89, albumin 1.8, and BNP 271.9.  Chest x-ray was significant for cardiac enlargement with bronchiectatic changes.  Urinalysis was positive for small leukocytes, many bacteria, 11-20 WBCs.  Patient had been given 500 mL of IV fluids  Patient was admitted for hypoxia and hypotension concerning for sepsis- thought secondary to a sacral wound that appears to be infected. Noted to have AKI along with concern for complicated UTI due to chronic indwelling Foley. Placed on empiric antibiotics of vancomycin and Rocephin. Hemoglobin down to 7.6 g/dL with guaiac positive stools. GI have been formally consulted and patient ordered to be transfused 1 unit of PRBCs. GS consulted for wound. Also noted large area of bruising on left UE, [place] negative for dvt. H/h dropped needing 1 unit PRBC transfusion  3/3.  Subjective: Seen this morning.  He is alert awake oriented.  Patient reports he ate well.   H&H down trended 7.4 gm. Afebrile and his leukocytosis has resolved  Assessment & Plan:  Sepsis 2/2 to infected sacral decubitus skin ulcer with cellulitis Complicated enterococcal UTI ESR CRP elevated.Urine culture with Enterococcus fecalis and blood culture with staph hominis-likely contamination.managing vancomycin and ceftriaxone MRSA negative, blood culture no growth so far, sacral wound evaluated by general surgery-no need for intervention and continuing dressing change/offloading.   I discussed with Dr. Doristine Devoid with ID 3/4- and will stop rocephin, and can stop vancomycin if remains stable and afebrile no leukocytosis tomorrow.  Recent Labs  Lab 07/19/20 1608 07/19/20 2150 07/20/20 0729 07/21/20 0304 07/22/20 0441 07/22/20 1650 07/23/20 0412  WBC  --   --  14.5* 12.1* 30.1* 12.4* 10.0  LATICACIDVEN 0.9 1.6  --   --   --   --   --    Anemia likely multifactorial possible acute blood loss anemia 2/2 possible GI bleed versus large bruise on the left upper extremity, FOBT positive: Likely bleeding in his left arm as the cause s/p 2 unit PRBC last 3/3.  Hemoglobin appropriately increased but again downtrending. GI was consulted and they felt it is not GI bleeding-and okay to continue Coumadin-GI not pursuing invasive endoscopic procedure at this time.Given drop in H/H Coumadin remains on hold. Left arm NEG for DVT on the ultrasound.  Recent Labs  Lab 07/21/20 1519 07/21/20 2219 07/22/20 0441 07/22/20 1650 07/23/20 0412  HGB 6.7* 9.2* 9.6* 8.2* 7.4*  HCT 20.7* 28.9* 28.5* 26.5* 23.8*  Left upper extremity bruise ?etiology-?from BP cuff in the setting of Coumadin versus other.??.  Negative for duplex DVT.  Denies any pain no significant increase in size.  Monitor .  If persistent anemia ??CT arm.  Sacral decubitus ulcer stage IV POA: Seen by surgery-appears clean area of necrosis  will slow up and per CCS no indication for debridement and has advised WTD bid dressing changes and more frequent for soiling. Has a small area of necrotic tissue on the edge and will likely slough off.  Wound care following  Acute on chronic hypoxic respiratory failure, hypoxic on presentation chest x-ray chronic bronchitis changes, continue supplemental oxygen, bronchodilators.  Her respiratory status is stable.  Is doing well on room air  AKI on CKD stage IIIb, creatinine past year baseline around 1.8, renal function has improved  Recent Labs    06/20/20 0748 06/21/20 0315 06/22/20 0724 06/23/20 0319 06/24/20 0619 07/19/20 1254 07/20/20 0729 07/21/20 0304 07/22/20 0441 07/23/20 0412  BUN 77* 81* 75* 71* 56* 83* 77* 65* 49* 38*  CREATININE 1.87* 1.87* 1.67* 1.69* 1.35* 1.89* 1.49* 1.41* 1.16 1.01   Permanent atrial fibrillation on Coumadin.Possibly stopping anticoagulation have been discussed due to the patient's risk of falls, but he did not want to stop the medication due to the possible risk of stroke.  Coumadin was held due to concern for acute bleeding-Per GI no active GI bleeding and okay to resume based on risk benefits.  Given his significant drop in hemoglobin holding Coumadin - hopefully can resume in 1-2 days if H/H stable. Recent Labs  Lab 07/19/20 1300 07/20/20 0729 07/21/20 0304 07/22/20 0441 07/23/20 0412  INR 2.3* 2.3* 1.9* 1.8* 1.7*   Chronic diastolic CHF: Patient is euvolemic.BNP 271, monitor intake output Daily weight Net IO Since Admission: 5,069.58 mL [07/23/20 1046]  Hyponatremia 126 on admission suspected hypovolemic.  Improved and at 132 now Recent Labs  Lab 07/19/20 1254 07/20/20 0729 07/21/20 0304 07/22/20 0441 07/23/20 0412  NA 126* 130* 133* 132* 132*   History of cardiac arrest post AICD/pacemaker History of COVID infection 06/14/2020 Generalized weakness/debility/deconditioning continue PT OT.  Has had frequent falls.  Been basically  bedbound.  Encourage PT OT  OSA-continue CPAP/?bipapa at snf  Severe protein calorie malnutrition-see below 1.2 prealbumin 5 continue to augment diet.  At risk of Of decompensation.  Diet Order            DIET SOFT Room service appropriate? Yes; Fluid consistency: Thin  Diet effective now                Pressure Injury 07/20/20 Sacrum Unstageable - Full thickness tissue loss in which the base of the injury is covered by slough (yellow, tan, gray, green or brown) and/or eschar (tan, brown or black) in the wound bed. Tunnelling, gray eschar (Active)  07/20/20 0021  Location: Sacrum  Location Orientation:   Staging: Unstageable - Full thickness tissue loss in which the base of the injury is covered by slough (yellow, tan, gray, green or brown) and/or eschar (tan, brown or black) in the wound bed.  Wound Description (Comments): Tunnelling, gray eschar  Present on Admission: Yes     Pressure Injury 07/20/20 Buttocks Right Stage 2 -  Partial thickness loss of dermis presenting as a shallow open injury with a red, pink wound bed without slough. (Active)  07/20/20 0026  Location: Buttocks  Location Orientation: Right  Staging: Stage 2 -  Partial thickness loss of dermis presenting as a shallow open  injury with a red, pink wound bed without slough.  Wound Description (Comments):   Present on Admission: Yes     Pressure Injury 07/20/20 Buttocks Left Stage 2 -  Partial thickness loss of dermis presenting as a shallow open injury with a red, pink wound bed without slough. (Active)  07/20/20 0027  Location: Buttocks  Location Orientation: Left  Staging: Stage 2 -  Partial thickness loss of dermis presenting as a shallow open injury with a red, pink wound bed without slough.  Wound Description (Comments):   Present on Admission: Yes     Pressure Injury 07/20/20 Heel Left Deep Tissue Pressure Injury - Purple or maroon localized area of discolored intact skin or blood-filled blister due to  damage of underlying soft tissue from pressure and/or shear. (Active)  07/20/20 0032  Location: Heel  Location Orientation: Left  Staging: Deep Tissue Pressure Injury - Purple or maroon localized area of discolored intact skin or blood-filled blister due to damage of underlying soft tissue from pressure and/or shear.  Wound Description (Comments):   Present on Admission: Yes   DVT prophylaxis: SCDs Start: 07/19/20 1557 coumadin Code Status:   Code Status: DNR  Family Communication: plan of care discussed with patient at bedside. I had updated patient's nephew over the phone 3/4.  Status is: Inpatient Remains inpatient appropriate because:Inpatient level of care appropriate due to severity of illness  Dispo: The patient is from: Home              Anticipated d/c is to: SNF.  Likely next 2 to 3 days. cont PTOT eval              Patient currently is not medically stable to d/c.   Difficult to place patient No   Unresulted Labs (From admission, onward)          Start     Ordered   07/22/20 9470  Basic metabolic panel  Daily,   R     Question:  Specimen collection method  Answer:  Lab=Lab collect   07/21/20 0748   07/22/20 0500  CBC  Daily,   R     Question:  Specimen collection method  Answer:  Lab=Lab collect   07/21/20 0748   07/20/20 0500  Protime-INR  Daily,   R      07/19/20 1716          Medications reviewed:  Scheduled Meds: . (feeding supplement) PROSource Plus  30 mL Oral BID BM  . Chlorhexidine Gluconate Cloth  6 each Topical Daily  . feeding supplement  237 mL Oral TID BM  . multivitamin with minerals  1 tablet Oral Daily  . pantoprazole (PROTONIX) IV  40 mg Intravenous Q24H  . sodium chloride flush  10-40 mL Intracatheter Q12H  . sodium chloride flush  3 mL Intravenous Q12H  . traMADol  50 mg Oral Q12H   Continuous Infusions: . sodium chloride 75 mL/hr at 07/23/20 0613  . cefTRIAXone (ROCEPHIN)  IV 1 g (07/22/20 1617)  . vancomycin 1,000 mg (07/23/20 1020)     Consultants:see note  Procedures:see note  Antimicrobials: Anti-infectives (From admission, onward)   Start     Dose/Rate Route Frequency Ordered Stop   07/23/20 1100  vancomycin (VANCOREADY) IVPB 1000 mg/200 mL        1,000 mg 200 mL/hr over 60 Minutes Intravenous Every 24 hours 07/23/20 0725     07/21/20 1800  vancomycin (VANCOREADY) IVPB 1250 mg/250 mL  Status:  Discontinued  1,250 mg 166.7 mL/hr over 90 Minutes Intravenous Every 48 hours 07/20/20 0901 07/21/20 0944   07/21/20 1100  vancomycin (VANCOREADY) IVPB 750 mg/150 mL  Status:  Discontinued        750 mg 150 mL/hr over 60 Minutes Intravenous Every 24 hours 07/21/20 0944 07/23/20 0725   07/19/20 2110  vancomycin variable dose per unstable renal function (pharmacist dosing)  Status:  Discontinued         Does not apply See admin instructions 07/19/20 2110 07/21/20 0944   07/19/20 1730  vancomycin (VANCOREADY) IVPB 1750 mg/350 mL        1,750 mg 175 mL/hr over 120 Minutes Intravenous  Once 07/19/20 1631 07/19/20 2111   07/19/20 1645  cefTRIAXone (ROCEPHIN) 1 g in sodium chloride 0.9 % 100 mL IVPB        1 g 200 mL/hr over 30 Minutes Intravenous Every 24 hours 07/19/20 1631       Culture/Microbiology    Component Value Date/Time   SDES URINE, RANDOM 07/20/2020 2251   SPECREQUEST NONE 07/20/2020 2251   CULT (A) 07/20/2020 2251    >=100,000 COLONIES/mL ENTEROCOCCUS FAECALIS SUSCEPTIBILITIES TO FOLLOW Performed at [facility], [place] [address] [place], [place] [address]    REPTSTATUS PENDING 07/20/2020 2251    Other culture-see note  Objective: Vitals: Today's Vitals   07/23/20 0551 07/23/20 0700 07/23/20 1042 07/23/20 1044  BP: (!) 119/52  103/70 103/70  Pulse: 70 72 70   Resp: _0 Temp: 98.1 F (36.7 C) 98 F (36.7 C) 99.2 F (37.3 C)   TempSrc: Oral Oral Oral   SpO2: 98% 97% 97%   PainSc:  5   7     Intake/Output Summary (Last 24 hours) at 07/23/2020 1046 Last data filed at 07/23/2020  1038 Gross per 24 hour  Intake 2368.9 ml  Output 1550 ml  Net 818.9 ml   There were no vitals filed for this visit. Weight change:   Intake/Output from previous day: 03/04 0701 - 03/05 0700 In: 2638.9 [P.O.:525; I.V.:1863.9; IV Piggyback:250] Out: 2700 [Urine:2700] Intake/Output this shift: Total I/O In: -  Out: 450 [Urine:450] There were no vitals filed for this visit.  Examination: General exam: AAO, ill looking elderly frail not in distress HEENT:Oral mucosa moist, Ear/Nose WNL grossly, dentition normal. Respiratory system: bilaterally diminishedd,no wheezing or crackles,no use of accessory muscle Cardiovascular system: S1 & S2 +, No JVD,. Gastrointestinal system: Abdomen soft, NT,ND, BS+ Nervous System:Alert, awake, moving extremities and grossly nonfocal Extremities: Left upper arm significant bruise present, no edema, distal peripheral pulses palpable.  Skin: No rashes,no icterus. MSK: Normal muscle bulk,tone, power Chronic indwelling catheter present Sacral decubitus present below prior to dressing previously    Data Reviewed: I have personally reviewed following labs and imaging studies CBC: Recent Labs  Lab 07/19/20 1254 07/20/20 0729 07/20/20 1328 07/21/20 0304 07/21/20 1519 07/21/20 2219 07/22/20 0441 07/22/20 1650 07/23/20 0412  WBC 16.0* 14.5*  --  12.1*  --   --  30.1* 12.4* 10.0  NEUTROABS 13.5*  --   --   --   --   --   --   --   --   HGB 7.6* 7.2*   < > 7.3* 6.7* 9.2* 9.6* 8.2* 7.4*  HCT 24.6* 22.0*   < > 23.7* 20.7* 28.9* 28.5* 26.5* 23.8*  MCV 84.5 81.2  --  83.7  --   --  83.1 85.2 84.7  PLT 436* 407*  --  400  --   --  368 336 329   < > = values in this interval not displayed.   Basic Metabolic Panel: Recent Labs  Lab 07/19/20 1254 07/20/20 0729 07/21/20 0304 07/22/20 0441 07/23/20 0412  NA 126* 130* 133* 132* 132*  K 5.1 4.0 4.4 4.1 4.0  CL 95* 99 104 100 106  CO2 18* 18* 18* 21* 19*  GLUCOSE 102* 96 103* 71 103*  BUN 83* 77*  65* 49* 38*  CREATININE 1.89* 1.49* 1.41* 1.16 1.01  CALCIUM 9.6 9.5 9.3 9.7 9.5   GFR: CrCl cannot be calculated (Unknown ideal weight.). Liver Function Tests: Recent Labs  Lab 07/19/20 1254  AST 27  ALT 21  ALKPHOS 96  BILITOT 0.9  PROT 6.3*  ALBUMIN 1.8*   No results for input(s): LIPASE, AMYLASE in the last 168 hours. No results for input(s): AMMONIA in the last 168 hours. Coagulation Profile: Recent Labs  Lab 07/19/20 1300 07/20/20 0729 07/21/20 0304 07/22/20 0441 07/23/20 0412  INR 2.3* 2.3* 1.9* 1.8* 1.7*   Cardiac Enzymes: No results for input(s): CKTOTAL, CKMB, CKMBINDEX, TROPONINI in the last 168 hours. BNP (last 3 results) No results for input(s): PROBNP in the last 8760 hours. HbA1C: No results for input(s): HGBA1C in the last 72 hours. CBG: No results for input(s): GLUCAP in the last 168 hours. Lipid Profile: No results for input(s): CHOL, HDL, LDLCALC, TRIG, CHOLHDL, LDLDIRECT in the last 72 hours. Thyroid Function Tests: No results for input(s): TSH, T4TOTAL, FREET4, T3FREE, THYROIDAB in the last 72 hours. Anemia Panel: No results for input(s): VITAMINB12, FOLATE, FERRITIN, TIBC, IRON, RETICCTPCT in the last 72 hours. Sepsis Labs: Recent Labs  Lab 07/19/20 1608 07/19/20 2150  LATICACIDVEN 0.9 1.6    Recent Results (from the past 240 hour(s))  Culture, blood (x 2)     Status: Abnormal   Collection Time: 07/19/20  5:37 PM   Specimen: BLOOD  Result Value Ref Range Status   Specimen Description BLOOD  Final   Special Requests   Final    RIGHT IJ BOTTLES DRAWN AEROBIC AND ANAEROBIC Blood Culture results may not be optimal due to an inadequate volume of blood received in culture bottles   Culture  Setup Time   Final    GRAM POSITIVE COCCI IN CLUSTERS IN BOTH AEROBIC AND ANAEROBIC BOTTLES Organism ID to follow CRITICAL RESULT CALLED TO, READ BACK BY AND VERIFIED WITH: H VON [facility] PHARMD 2056 07/20/20 A BROWNING    Culture (A)  Final     STAPHYLOCOCCUS HOMINIS THE SIGNIFICANCE OF ISOLATING THIS ORGANISM FROM A SINGLE SET OF BLOOD CULTURES WHEN MULTIPLE SETS ARE DRAWN IS UNCERTAIN. PLEASE NOTIFY THE MICROBIOLOGY DEPARTMENT WITHIN ONE WEEK IF SPECIATION AND SENSITIVITIES ARE REQUIRED. Performed at [facility], [place] [address], [place], [place] [address]    Report Status 07/22/2020 FINAL  Final  Blood Culture ID Panel (Reflexed)     Status: Abnormal   Collection Time: 07/19/20  5:37 PM  Result Value Ref Range Status   Enterococcus faecalis NOT DETECTED NOT DETECTED Final   Enterococcus Faecium NOT DETECTED NOT DETECTED Final   Listeria monocytogenes NOT DETECTED NOT DETECTED Final   Staphylococcus species DETECTED (A) NOT DETECTED Final    Comment: CRITICAL RESULT CALLED TO, READ BACK BY AND VERIFIED WITHJiles [facility] PHARMD 2056 07/20/20 A BROWNING    Staphylococcus aureus (BCID) NOT DETECTED NOT DETECTED Final   Staphylococcus epidermidis NOT DETECTED NOT DETECTED Final   Staphylococcus  lugdunensis NOT DETECTED NOT DETECTED Final   Streptococcus species NOT DETECTED NOT DETECTED Final   Streptococcus agalactiae NOT DETECTED NOT DETECTED Final   Streptococcus pneumoniae NOT DETECTED NOT DETECTED Final   Streptococcus pyogenes NOT DETECTED NOT DETECTED Final   A.calcoaceticus-baumannii NOT DETECTED NOT DETECTED Final   Bacteroides fragilis NOT DETECTED NOT DETECTED Final   Enterobacterales NOT DETECTED NOT DETECTED Final   Enterobacter cloacae complex NOT DETECTED NOT DETECTED Final   Escherichia coli NOT DETECTED NOT DETECTED Final   Klebsiella aerogenes NOT DETECTED NOT DETECTED Final   Klebsiella oxytoca NOT DETECTED NOT DETECTED Final   Klebsiella pneumoniae NOT DETECTED NOT DETECTED Final   Proteus species NOT DETECTED NOT DETECTED Final   Salmonella species NOT DETECTED NOT DETECTED Final   Serratia marcescens NOT DETECTED NOT DETECTED Final   Haemophilus influenzae NOT DETECTED NOT DETECTED Final    Neisseria meningitidis NOT DETECTED NOT DETECTED Final   Pseudomonas aeruginosa NOT DETECTED NOT DETECTED Final   Stenotrophomonas maltophilia NOT DETECTED NOT DETECTED Final   Candida albicans NOT DETECTED NOT DETECTED Final   Candida auris NOT DETECTED NOT DETECTED Final   Candida glabrata NOT DETECTED NOT DETECTED Final   Candida krusei NOT DETECTED NOT DETECTED Final   Candida parapsilosis NOT DETECTED NOT DETECTED Final   Candida tropicalis NOT DETECTED NOT DETECTED Final   Cryptococcus neoformans/gattii NOT DETECTED NOT DETECTED Final    Comment: Performed at [facility], [address] [place], [place] [address]  MRSA PCR Screening     Status: None   Collection Time: 07/19/20  9:43 PM   Specimen: Nasal Mucosa; Nasopharyngeal  Result Value Ref Range Status   MRSA by PCR NEGATIVE NEGATIVE Final    Comment:        The GeneXpert MRSA Assay (FDA approved for NASAL specimens only), is one component of a comprehensive MRSA colonization surveillance program. It is not intended to diagnose MRSA infection nor to guide or monitor treatment for MRSA infections. Performed at [facility], [place] [address] [place], [place] [address]   Culture, blood (x 2)     Status: None (Preliminary result)   Collection Time: 07/19/20  9:48 PM   Specimen: BLOOD  Result Value Ref Range Status   Specimen Description BLOOD RIGHT ANTECUBITAL  Final   Special Requests   Final    BOTTLES DRAWN AEROBIC AND ANAEROBIC Blood Culture results may not be optimal due to an inadequate volume of blood received in culture bottles   Culture   Final    NO GROWTH 4 DAYS Performed at [facility], [place] [address] [place], [place] [address]    Report Status PENDING  Incomplete  Culture, Urine     Status: Abnormal (Preliminary result)   Collection Time: 07/20/20 10:51 PM   Specimen: Urine, Random  Result Value Ref Range Status   Specimen Description URINE, RANDOM  Final   Special Requests NONE   Final   Culture (A)  Final    >=100,000 COLONIES/mL ENTEROCOCCUS FAECALIS SUSCEPTIBILITIES TO FOLLOW Performed at [facility], [place] [address] [place], [place] [address]    Report Status PENDING  Incomplete     Radiology Studies: No results found.   LOS: 4 days   Antonieta Pert, MD [facility]  07/23/2020, 10:46 AM

## 2020-07-23 NOTE — Progress Notes (Signed)
ANTICOAGULATION CONSULT NOTE   Pharmacy Consult for Warfarin Indication: atrial fibrillation  No Known Allergies  Patient Measurements:    Vital Signs: Temp: 99.2 F (37.3 C) (03/05 1042) Temp Source: Oral (03/05 1042) BP: 103/70 (03/05 1044) Pulse Rate: 70 (03/05 1042)  Labs: Recent Labs    07/21/20 0304 07/21/20 1519 07/22/20 0441 07/22/20 1650 07/23/20 0412  HGB 7.3*   < > 9.6* 8.2* 7.4*  HCT 23.7*   < > 28.5* 26.5* 23.8*  PLT 400  --  368 336 329  LABPROT 21.3*  --  19.8*  --  19.6*  INR 1.9*  --  1.8*  --  1.7*  CREATININE 1.41*  --  1.16  --  1.01   < > = values in this interval not displayed.    CrCl cannot be calculated (Unknown ideal weight.).   Assessment: 36 YOM who presented on 3/1 with hypoxia and SOB. The patient was on warfarin PTA for hx Afib - held d/t concern for GIB w/ FOB+.  Originally, pharmacy was consulted to restart warfarin after clearance from GI.  Given decreasing hemoglobin 3/4, pharmacy now consulted to HOLD warfarin.   Admit INR 2.3 on PTA dose of 2.5 mg/day - given Vit K 1 mg x 1 on 3/2. INR today is SUBtherapeutic at 1.7.  Hemoglobin trend from 3/4-3/5 9.6 >>7.4.   Goal of Therapy:  INR 2-3 Monitor platelets by anticoagulation protocol: Yes   Plan:  -Hold coumadin due to Hg drop -Daily PT/INR -follow CBC -F/u ability to restart warfarin   Rexford Maus, PharmD PGY-1 Acute Care Pharmacy Resident Office: [phone] 07/23/2020 11:26 AM

## 2020-07-23 NOTE — Progress Notes (Addendum)
Pharmacy Antibiotic Note  Ruben Walker is a 80 y.o. male admitted on 07/19/2020 with sepsis secondary to sacral decubitus ulcer.  Pharmacy has been consulted for vancomycin dosing.  The patient's SCr has trended down from 1.89 to 1.01, will adjust the Vancomycin dose again today. WBC down since 3/4 30 >>10.  Patient remains afebrile.    Noted staph species in 2 of 4 blood cultures - likely just a contaminant but with ICD - MD should eval site to ensure looks clean.   Patient was seen by surgery- per CCS no indication for debridement at this time, continue with wound care.   Plan: - Adjust Vancomycin to 1000 mg IV every 24 hours (eAUC 533, Vd 0.5, SCr 1.01) - Cont CTX per MD - Will continue to follow renal function, culture results, LOT, and antibiotic de-escalation plans   Temp (24hrs), Avg:97.9 F (36.6 C), Min:97.5 F (36.4 C), Max:98.1 F (36.7 C)  Recent Labs  Lab 07/19/20 1254 07/19/20 1608 07/19/20 2150 07/20/20 0729 07/21/20 0304 07/22/20 0441 07/22/20 1650 07/23/20 0412  WBC 16.0*  --   --  14.5* 12.1* 30.1* 12.4* 10.0  CREATININE 1.89*  --   --  1.49* 1.41* 1.16  --  1.01  LATICACIDVEN  --  0.9 1.6  --   --   --   --   --     CrCl cannot be calculated (Unknown ideal weight.).    No Known Allergies  Vancomycin 3/1> Ceftriaxone 3/1>  3/1 BCx >> 2/4 staph hominis (likely contaminant) 3/1 BCx: no growth to date (pending) 3/1 MRSA PCR >> neg 3/2 UCx: >100K Enterococcus (suspc pending)  Thank you for allowing pharmacy to be a part of this patient's care.  Rexford Maus, PharmD PGY-1 Acute Care Pharmacy Resident Office: [phone] 07/23/2020 7:24 AM    3/5 11:22 AM update: -CTX d/c'd by MD  -Watch leukocytosis- will plan to d/c vancomycin 3/6 if resolved

## 2020-07-23 NOTE — Plan of Care (Signed)
  Problem: Clinical Measurements: Goal: Ability to maintain clinical measurements within normal limits will improve Outcome: Progressing   

## 2020-07-24 DIAGNOSIS — J9621 Acute and chronic respiratory failure with hypoxia: Secondary | ICD-10-CM | POA: Diagnosis not present

## 2020-07-24 DIAGNOSIS — A419 Sepsis, unspecified organism: Secondary | ICD-10-CM | POA: Diagnosis not present

## 2020-07-24 LAB — CULTURE, BLOOD (ROUTINE X 2): Culture: NO GROWTH

## 2020-07-24 LAB — BASIC METABOLIC PANEL
Anion gap: 8 (ref 5–15)
BUN: 33 mg/dL — ABNORMAL HIGH (ref 8–23)
CO2: 19 mmol/L — ABNORMAL LOW (ref 22–32)
Calcium: 9.5 mg/dL (ref 8.9–10.3)
Chloride: 105 mmol/L (ref 98–111)
Creatinine, Ser: 0.91 mg/dL (ref 0.61–1.24)
GFR, Estimated: >60 mL/min (ref >60)
Glucose, Bld: 86 mg/dL (ref 70–99)
Potassium: 4.2 mmol/L (ref 3.5–5.1)
Sodium: 132 mmol/L — ABNORMAL LOW (ref 135–145)

## 2020-07-24 LAB — CBC
HCT: 24.2 % — ABNORMAL LOW (ref 39.0–52.0)
Hemoglobin: 7.8 g/dL — ABNORMAL LOW (ref 13.0–17.0)
MCH: 27.2 pg (ref 26.0–34.0)
MCHC: 32.2 g/dL (ref 30.0–36.0)
MCV: 84.3 fL (ref 80.0–100.0)
Platelets: 308 10*3/uL (ref 150–400)
RBC: 2.87 MIL/uL — ABNORMAL LOW (ref 4.22–5.81)
RDW: 15.7 % — ABNORMAL HIGH (ref 11.5–15.5)
WBC: 9.5 10*3/uL (ref 4.0–10.5)
nRBC: 0 % (ref 0.0–0.2)

## 2020-07-24 LAB — PROTIME-INR
INR: 1.5 — ABNORMAL HIGH (ref 0.8–1.2)
Prothrombin Time: 17.9 seconds — ABNORMAL HIGH (ref 11.4–15.2)

## 2020-07-24 NOTE — Hospital Course (Signed)
Ruben Walker is a 80 yo male with history of HTN, HLD, chronic diastolic CHF, A. fib on Coumadin history of cardiac arrest s/p pacemaker/AICD, chronic respiratory failure on 2 L oxygen, CKD 3, and OSA on CPAP, poor historian, admitted with hypoxia and hypotension on 07/19/20.    In the ED, afebrile, pulse 59-72, respirations 18-33, blood pressure 86/39-163/143, and O2 saturations currently maintained on 2 L of nasal cannula oxygen.  Labs significant for WBC 16, hemoglobin 7.6, platelets 436, sodium 126, chloride 95, CO2 18, BUN 83, creatinine 1.89, albumin 1.8, and BNP 271.9.  Chest x-ray was significant for cardiac enlargement with bronchiectatic changes.  Urinalysis was positive for small leukocytes, many bacteria, 11-20 WBCs.  Patient had been given 500 mL of IV fluids   Patient was admitted for hypoxia and hypotension concerning for sepsis- thought secondary to a sacral wound that appears to be infected.  Noted to have AKI along with concern for complicated UTI due to chronic indwelling Foley.  Placed on empiric antibiotics of vancomycin and Rocephin.  Hemoglobin down to 7.6 g/dL with guaiac positive stools.  GI have been formally consulted and patient ordered to be transfused 1 unit of PRBCs. GS consulted for wound. Also noted large area of bruising on left UE, [place] negative for dvt. H/h dropped needing 1 unit PRBC transfusion 3/3.

## 2020-07-24 NOTE — TOC Initial Note (Signed)
Transition of Care [facility]) - Initial/Assessment Note    Patient Details  Name: Ruben Walker MRN: [id number] Date of Birth: 03-29-1941  Transition of Care [facility]) CM/SW Contact:    Terrial Rhodes, LCSWA Phone Number: 07/24/2020, 9:14 AM  Clinical Narrative:                  CSW received consult for possible SNF placement at time of discharge. CSW spoke with patients sister Ruben Walker regarding PT recommendation of SNF placement at time of discharge.Patients sister Ruben Walker expressed understanding of PT recommendation and is agreeable to SNF placement for patient at time of discharge. Patients sister Ruben Walker reports that patient is from [place] short term rehab. Patients sister does not want patient to return to [place] for short term rehab. Patients sister wants CSW to fax out to other facilities for SNF placement. Patients sister Ruben Walker gave CSW permission to fax out near [place], and [place] area.Patient has not received the COVID vaccines. No further questions reported at this time. CSW to continue to follow and assist with discharge planning needs.   Expected Discharge Plan: Skilled Nursing Facility Barriers to Discharge: Continued Medical Work up   Patient Goals and CMS Choice   CMS Medicare.gov Compare Post Acute Care list provided to:: Patient Represenative (must comment) (Sister Ruben Walker) Choice offered to / list presented to : Sibling (Sister Ruben Walker)  Expected Discharge Plan and Services Expected Discharge Plan: Skilled Nursing Facility In-house Referral: Clinical Social Work     Living arrangements for the past 2 months: Skilled Nursing Facility                                      Prior Living Arrangements/Services Living arrangements for the past 2 months: Skilled Nursing Facility Lives with:: Self,Facility Resident (came from [facility] term rehab) Patient language and need for interpreter reviewed:: Yes Do you feel safe going back to  the place where you live?: No   SNF  Need for Family Participation in Patient Care: Yes (Comment) Care giver support system in place?: Yes (comment)   Criminal Activity/Legal Involvement Pertinent to Current Situation/Hospitalization: No - Comment as needed  Activities of Daily Living Home Assistive Devices/Equipment: None ADL Screening (condition at time of admission) Patient's cognitive ability adequate to safely complete daily activities?: Yes Is the patient deaf or have difficulty hearing?: No Does the patient have difficulty seeing, even when wearing glasses/contacts?: No Does the patient have difficulty concentrating, remembering, or making decisions?: No Patient able to express need for assistance with ADLs?: Yes Does the patient have difficulty dressing or bathing?: Yes Independently performs ADLs?: No Communication: Independent Dressing (OT): Dependent Is this a change from baseline?: Pre-admission baseline Grooming: Dependent Is this a change from baseline?: Pre-admission baseline Bathing: Needs assistance Is this a change from baseline?: Pre-admission baseline Toileting: Dependent Is this a change from baseline?: Pre-admission baseline In/Out Bed: Dependent Is this a change from baseline?: Pre-admission baseline Walks in Home: Dependent Is this a change from baseline?: Pre-admission baseline Does the patient have difficulty walking or climbing stairs?: Yes Weakness of Legs: Both Weakness of Arms/Hands: None  Permission Sought/Granted Permission sought to share information with : Case Manager,Family Electrical engineer Permission granted to share information with : Yes, Verbal Permission Granted     Permission granted to share info w AGENCY: SNF        Emotional Assessment  Orientation: : Oriented to Self Alcohol / Substance Use: Not Applicable Psych Involvement: No (comment)  Admission diagnosis:  Sepsis [facility]) [A41.9] Patient  Active Problem List   Diagnosis Date Noted  . Sepsis (HCC) 07/19/2020  . Hyponatremia 07/19/2020  . History of COVID-19 07/19/2020  . Acute on chronic respiratory failure with hypoxia (HCC) 07/19/2020  . Complicated urinary tract infection 07/19/2020  . Cellulitis of sacral region 07/19/2020  . Severe protein-calorie malnutrition [name]: less than 60% of standard weight) (HCC) 06/22/2020  . Acute kidney injury superimposed on CKD (HCC) 06/15/2020  . Frequent falls 06/14/2020  . Bilateral knee pain 05/06/2019  . Pain in joint of left elbow 05/06/2019  . Pain of left hip joint 05/01/2019  . Decubitus skin ulcer 08/14/2016  . Acute respiratory failure with hypoxemia (HCC) 08/13/2016  . AKI (acute kidney injury) (HCC) 08/13/2016  . Chronic diastolic heart failure (HCC)   . Edema leg 02/11/2015  . Chronic renal insufficiency, stage III (moderate) (HCC) 11/04/2014  . Obesity 07/22/2014  . ICD (implantable cardioverter-defibrillator) in place 07/22/2014  . Skin lesion of right leg 06/03/2014  . Bronchitis, chronic obstructive, with exacerbation (HCC) 01/04/2014  . Complete heart block  s/p AV ablation 03/26/2013  . Cardiac arrest (HCC) 03/26/2013  . Sleep apnea 03/26/2013  . Permanent atrial fibrillation (HCC) 09/06/2012  . Hyperlipidemia 02/04/2009  . Essential hypertension 02/04/2009   PCP:  [name], MD Pharmacy:   [facility] - [place], [place] - [address] [place] [address] Phone: [phone] Fax: [phone]     Social Determinants of Health (SDOH) Interventions    Readmission Risk Interventions No flowsheet data found.

## 2020-07-24 NOTE — NC FL2 (Signed)
[place] MEDICAID FL2 LEVEL OF CARE SCREENING TOOL     IDENTIFICATION  Patient Name: Ruben Walker Birthdate: [birth year] Sex: male Admission Date (Current Location): 07/19/2020  [facility] and [place] Number:  Producer, television/film/video and Address:  [facility] [place], [address] [place], [place] [address]      Provider Number: [id number]  Attending Physician Name and Address:  Lewie Chamber, MD  Relative Name and Phone Number:  Windell Moulding [phone]    Current Level of Care: Hospital Recommended Level of Care: Skilled Nursing Facility Prior Approval Number:    Date Approved/Denied:   PASRR Number: [id number]  Discharge Plan: SNF    Current Diagnoses: Patient Active Problem List   Diagnosis Date Noted  . Sepsis (HCC) 07/19/2020  . Hyponatremia 07/19/2020  . History of COVID-19 07/19/2020  . Acute on chronic respiratory failure with hypoxia (HCC) 07/19/2020  . Complicated urinary tract infection 07/19/2020  . Cellulitis of sacral region 07/19/2020  . Severe protein-calorie malnutrition [name]: less than 60% of standard weight) (HCC) 06/22/2020  . Acute kidney injury superimposed on CKD (HCC) 06/15/2020  . Frequent falls 06/14/2020  . Bilateral knee pain 05/06/2019  . Pain in joint of left elbow 05/06/2019  . Pain of left hip joint 05/01/2019  . Decubitus skin ulcer 08/14/2016  . Acute respiratory failure with hypoxemia (HCC) 08/13/2016  . AKI (acute kidney injury) (HCC) 08/13/2016  . Chronic diastolic heart failure (HCC)   . Edema leg 02/11/2015  . Chronic renal insufficiency, stage III (moderate) (HCC) 11/04/2014  . Obesity 07/22/2014  . ICD (implantable cardioverter-defibrillator) in place 07/22/2014  . Skin lesion of right leg 06/03/2014  . Bronchitis, chronic obstructive, with exacerbation (HCC) 01/04/2014  . Complete heart block  s/p AV ablation 03/26/2013  . Cardiac arrest (HCC) 03/26/2013  . Sleep apnea 03/26/2013  . Permanent atrial  fibrillation (HCC) 09/06/2012  . Hyperlipidemia 02/04/2009  . Essential hypertension 02/04/2009    Orientation RESPIRATION BLADDER Height & Weight     Self  Normal Incontinent (Urethral Catheter 14 Fr.) Weight:   Height:     BEHAVIORAL SYMPTOMS/MOOD NEUROLOGICAL BOWEL NUTRITION STATUS      Incontinent Diet (See discharge summary)  AMBULATORY STATUS COMMUNICATION OF NEEDS Skin   Total Care Verbally Other (Comment) (Eccymosis arm L,PI sacrum unstageable,PI buttocks R stage 2,PI buttocks L stage 2,PI Heel L deep tissue, all need to have foam-lift dressing to assess site every shift)                       Personal Care Assistance Level of Assistance  Bathing,Feeding,Dressing Bathing Assistance: Maximum assistance Feeding assistance: Independent (able to feed self) Dressing Assistance: Maximum assistance     Functional Limitations Info  Sight,Hearing,Speech Sight Info: Adequate Hearing Info: Impaired Speech Info: Adequate    SPECIAL CARE FACTORS FREQUENCY  PT (By licensed PT),OT (By licensed OT)     PT Frequency: 5x min weekly OT Frequency: 5x min weekly            Contractures Contractures Info: Not present    Additional Factors Info  Code Status Code Status Info: DNR             Current Medications (07/24/2020):  This is the current hospital active medication list Current Facility-Administered Medications  Medication Dose Route Frequency Provider Last Rate Last Admin  . (feeding supplement) PROSource Plus liquid 30 mL  30 mL Oral BID BM Kc, Ramesh, MD   30 mL  at 07/24/20 0924  . 0.9 %  sodium chloride infusion   Intravenous Continuous Madelyn Flavors A, MD 75 mL/hr at 07/23/20 2150 New Bag at 07/23/20 2150  . acetaminophen (TYLENOL) tablet 650 mg  650 mg Oral Q6H PRN Madelyn Flavors A, MD   650 mg at 07/23/20 1010   Or  . acetaminophen (TYLENOL) suppository 650 mg  650 mg Rectal Q6H PRN Smith, Rondell A, MD      . albuterol (PROVENTIL) (2.5 MG/3ML) 0.083%  nebulizer solution 2.5 mg  2.5 mg Nebulization Q6H PRN Madelyn Flavors A, MD      . Chlorhexidine Gluconate Cloth 2 % PADS 6 each  6 each Topical Daily Clydie Braun, MD   6 each at 07/24/20 [phone]  . feeding supplement (ENSURE ENLIVE / ENSURE PLUS) liquid 237 mL  237 mL Oral TID BM Smith, Rondell A, MD   237 mL at 07/24/20 0924  . multivitamin with minerals tablet 1 tablet  1 tablet Oral Daily Lanae Boast, MD   1 tablet at 07/24/20 0924  . pantoprazole (PROTONIX) injection 40 mg  40 mg Intravenous Q24H Charlott Rakes, MD   40 mg at 07/23/20 1424  . sodium chloride flush (NS) 0.9 % injection 10-40 mL  10-40 mL Intracatheter Q12H Smith, Rondell A, MD      . sodium chloride flush (NS) 0.9 % injection 10-40 mL  10-40 mL Intracatheter PRN Smith, Rondell A, MD      . sodium chloride flush (NS) 0.9 % injection 3 mL  3 mL Intravenous Q12H Smith, Rondell A, MD      . traMADol (ULTRAM) tablet 50 mg  50 mg Oral Q12H Lanae Boast, MD   50 mg at 07/24/20 2947     Discharge Medications: Please see discharge summary for a list of discharge medications.  Relevant Imaging Results:  Relevant Lab Results:   Additional Information [id number]  Terrial Rhodes, LCSWA

## 2020-07-24 NOTE — Progress Notes (Signed)
PROGRESS NOTE    Ruben Walker   ONG:[id number]  DOB: 1940-09-27  DOA: 07/19/2020     5  PCP: Tammi Sou, MD  CC: hypoxia, hypoTN  Hospital Course: Ruben Walker is a 80 yo male with history of HTN, HLD, chronic diastolic CHF, A. fib on Coumadin history of cardiac arrest s/p pacemaker/AICD, chronic respiratory failure on 2 L oxygen, CKD 3, and OSA on CPAP, poor historian, admitted with hypoxia and hypotension on 07/19/20.    In the ED, afebrile, pulse 59-72, respirations 18-33, blood pressure 86/39-163/143, and O2 saturations currently maintained on 2 L of nasal cannula oxygen.  Labs significant for WBC 16, hemoglobin 7.6, platelets 436, sodium 126, chloride 95, CO2 18, BUN 83, creatinine 1.89, albumin 1.8, and BNP 271.9.  Chest x-ray was significant for cardiac enlargement with bronchiectatic changes.  Urinalysis was positive for small leukocytes, many bacteria, 11-20 WBCs.  Patient had been given 500 mL of IV fluids   Patient was admitted for hypoxia and hypotension concerning for sepsis- thought secondary to a sacral wound that appears to be infected.  Noted to have AKI along with concern for complicated UTI due to chronic indwelling Foley.  Placed on empiric antibiotics of vancomycin and Rocephin.  Hemoglobin down to 7.6 g/dL with guaiac positive stools.  GI have been formally consulted and patient ordered to be transfused 1 unit of PRBCs. GS consulted for wound. Also noted large area of bruising on left UE, [place] negative for dvt. H/h dropped needing 1 unit PRBC transfusion 3/3.   Interval History:  Laying in bed in no distress.  Mentation remains slow.  Also remains a poor historian, has difficulty providing much collateral information.  Does not seem to have a good understanding of his overall health and was living at home alone prior to SNF.  ROS: Constitutional: negative for chills and fevers, Respiratory: negative for cough, Cardiovascular: negative for chest pain and  Gastrointestinal: negative for abdominal pain  Assessment & Plan: Sepsis 2/2 to infected sacral decubitus skin ulcer with cellulitis Complicated enterococcal UTI ESR CRP elevated.Urine culture with Enterococcus fecalis and blood culture with staph hominis-likely contamination.  - s/p vanc and rocephin; MRSA negative; discussed with ID; courses now considered complete; remains afebrile and leukocytosis has resolved   Sacral decubitus ulcer stage IV POA - Seen by surgery-appears clean; will continue to slough off; no indication for debridement per surgery - continue WTD BID dressing changes and for soiling  Acute on chronic hypoxic respiratory failure - hypoxic on presentation chest x-ray chronic bronchitis changes, continue supplemental oxygen, bronchodilators  Normocytic Anemia  - multifactorial: ABLA, LUE hematoma, chronically ill, possible GIB (no interventions at this time per GI) - hold coumadin still - LUE negative for DVT  Permanent atrial fibrillation - holding coumadin still; Hgb 7.8 g/dL and appears to be stabilizing; watch for 1 more day  Left upper extremity bruise - etiology likely anticoagulation with some possible trauma (slept on left side?) - negative DVT on duplex 3/2 - supportive care   AKI on CKD stage IIIb - patient has history of CKD3b. Baseline creat ~ 1.8, eGFR 35-40 - patient presents with increase in creat >0.3 mg/dL above baseline presumed to have occurred within past 7 days PTA  Chronic diastolic CHF - Patient is euvolemic - no s/s exacerbation   Hyponatremia  - 126 on admission suspected hypovolemic - improved with IVF  History of cardiac arrest post AICD/pacemaker History of COVID infection 06/14/2020 Generalized weakness/debility/deconditioning continue PT OT.  Has had frequent falls.  Been basically bedbound.  Encourage PT OT  OSA-continue CPAP/?bipap at snf  Severe protein calorie malnutrition -  1.2 prealbumin; continue to augment diet.   At risk of decompensation. - related to chronic illness as evidenced by severe muscle depletion,severe fat depletion - continue diet and supplements   Old records reviewed in assessment of this patient  Antimicrobials: Rocephin 07/19/20 >> 07/22/20 Vanc 3/1 - 3/5  DVT prophylaxis: SCDs Start: 07/19/20 1557   Code Status:   Code Status: DNR Family Communication: none present  Disposition Plan: Status is: Inpatient  Remains inpatient appropriate because:Inpatient level of care appropriate due to severity of illness   Dispo: The patient is from: SNF              Anticipated d/c is to: SNF              Patient currently is not medically stable to d/c.   Difficult to place patient No  Risk of unplanned readmission score: Unplanned Admission- Pilot do not use: 28.04   Objective: Blood pressure (!) 127/43, pulse 70, temperature 98.1 F (36.7 C), temperature source Oral, resp. rate 16, SpO2 98 %.  Examination: General appearance: alert, cooperative, no distress and slowed mentation Head: Normocephalic, without obvious abnormality, atraumatic Eyes: EOMI Lungs: clear to auscultation bilaterally Heart: irregularly irregular rhythm and S1, S2 normal Abdomen: obese, soft, ND, NT Extremities: trace LE edema Skin: mobility and turgor normal Neurologic: follows commands; moves all 4 extremities  Consultants:   GI  Procedures:   n/a  Data Reviewed: I have personally reviewed following labs and imaging studies Results for orders placed or performed during the hospital encounter of 07/19/20 (from the past 24 hour(s))  Protime-INR     Status: Abnormal   Collection Time: 07/24/20  3:56 AM  Result Value Ref Range   Prothrombin Time 17.9 (H) 11.4 - 15.2 seconds   INR 1.5 (H) 0.8 - 1.2  Basic metabolic panel     Status: Abnormal   Collection Time: 07/24/20  3:56 AM  Result Value Ref Range   Sodium 132 (L) 135 - 145 mmol/L   Potassium 4.2 3.5 - 5.1 mmol/L   Chloride 105 98 - 111  mmol/L   CO2 19 (L) 22 - 32 mmol/L   Glucose, Bld 86 70 - 99 mg/dL   BUN 33 (H) 8 - 23 mg/dL   Creatinine, Ser 0.91 0.61 - 1.24 mg/dL   Calcium 9.5 8.9 - 10.3 mg/dL   GFR, Estimated >60 >60 mL/min   Anion gap 8 5 - 15  CBC     Status: Abnormal   Collection Time: 07/24/20  3:56 AM  Result Value Ref Range   WBC 9.5 4.0 - 10.5 K/uL   RBC 2.87 (L) 4.22 - 5.81 MIL/uL   Hemoglobin 7.8 (L) 13.0 - 17.0 g/dL   HCT 24.2 (L) 39.0 - 52.0 %   MCV 84.3 80.0 - 100.0 fL   MCH 27.2 26.0 - 34.0 pg   MCHC 32.2 30.0 - 36.0 g/dL   RDW 15.7 (H) 11.5 - 15.5 %   Platelets 308 150 - 400 K/uL   nRBC 0.0 0.0 - 0.2 %    Recent Results (from the past 240 hour(s))  Culture, blood (x 2)     Status: Abnormal   Collection Time: 07/19/20  5:37 PM   Specimen: BLOOD  Result Value Ref Range Status   Specimen Description BLOOD  Final   Special Requests  Final    RIGHT IJ BOTTLES DRAWN AEROBIC AND ANAEROBIC Blood Culture results may not be optimal due to an inadequate volume of blood received in culture bottles   Culture  Setup Time   Final    GRAM POSITIVE COCCI IN CLUSTERS IN BOTH AEROBIC AND ANAEROBIC BOTTLES Organism ID to follow CRITICAL RESULT CALLED TO, READ BACK BY AND VERIFIED WITH: H VON [facility] PHARMD [id number] 07/20/20 A BROWNING    Culture (A)  Final    STAPHYLOCOCCUS HOMINIS THE SIGNIFICANCE OF ISOLATING THIS ORGANISM FROM A SINGLE SET OF BLOOD CULTURES WHEN MULTIPLE SETS ARE DRAWN IS UNCERTAIN. PLEASE NOTIFY THE MICROBIOLOGY DEPARTMENT WITHIN ONE WEEK IF SPECIATION AND SENSITIVITIES ARE REQUIRED. Performed at [facility], [place] [address] [place], [place] [address]    Report Status 07/22/2020 FINAL  Final  Blood Culture ID Panel (Reflexed)     Status: Abnormal   Collection Time: 07/19/20  5:37 PM  Result Value Ref Range Status   Enterococcus faecalis NOT DETECTED NOT DETECTED Final   Enterococcus Faecium NOT DETECTED NOT DETECTED Final   Listeria monocytogenes NOT DETECTED NOT DETECTED Final    Staphylococcus species DETECTED (A) NOT DETECTED Final    Comment: CRITICAL RESULT CALLED TO, READ BACK BY AND VERIFIED WITH: H VON [facility] PHARMD 2056 07/20/20 A BROWNING    Staphylococcus aureus (BCID) NOT DETECTED NOT DETECTED Final   Staphylococcus epidermidis NOT DETECTED NOT DETECTED Final   Staphylococcus lugdunensis NOT DETECTED NOT DETECTED Final   Streptococcus species NOT DETECTED NOT DETECTED Final   Streptococcus agalactiae NOT DETECTED NOT DETECTED Final   Streptococcus pneumoniae NOT DETECTED NOT DETECTED Final   Streptococcus pyogenes NOT DETECTED NOT DETECTED Final   A.calcoaceticus-baumannii NOT DETECTED NOT DETECTED Final   Bacteroides fragilis NOT DETECTED NOT DETECTED Final   Enterobacterales NOT DETECTED NOT DETECTED Final   Enterobacter cloacae complex NOT DETECTED NOT DETECTED Final   Escherichia coli NOT DETECTED NOT DETECTED Final   Klebsiella aerogenes NOT DETECTED NOT DETECTED Final   Klebsiella oxytoca NOT DETECTED NOT DETECTED Final   Klebsiella pneumoniae NOT DETECTED NOT DETECTED Final   Proteus species NOT DETECTED NOT DETECTED Final   Salmonella species NOT DETECTED NOT DETECTED Final   Serratia marcescens NOT DETECTED NOT DETECTED Final   Haemophilus influenzae NOT DETECTED NOT DETECTED Final   Neisseria meningitidis NOT DETECTED NOT DETECTED Final   Pseudomonas aeruginosa NOT DETECTED NOT DETECTED Final   Stenotrophomonas maltophilia NOT DETECTED NOT DETECTED Final   Candida albicans NOT DETECTED NOT DETECTED Final   Candida auris NOT DETECTED NOT DETECTED Final   Candida glabrata NOT DETECTED NOT DETECTED Final   Candida krusei NOT DETECTED NOT DETECTED Final   Candida parapsilosis NOT DETECTED NOT DETECTED Final   Candida tropicalis NOT DETECTED NOT DETECTED Final   Cryptococcus neoformans/gattii NOT DETECTED NOT DETECTED Final    Comment: Performed at [facility], [address], [place], [place] [address]  MRSA PCR Screening     Status:  None   Collection Time: 07/19/20  9:43 PM   Specimen: Nasal Mucosa; Nasopharyngeal  Result Value Ref Range Status   MRSA by PCR NEGATIVE NEGATIVE Final    Comment:        The GeneXpert MRSA Assay (FDA approved for NASAL specimens only), is one component of a comprehensive MRSA colonization surveillance program. It is not intended to diagnose MRSA infection nor to guide or monitor treatment for MRSA infections. Performed at [facility], [address] [place], [place] [address]   Culture, blood (x 2)     Status: None   Collection Time: 07/19/20  9:48 PM   Specimen: BLOOD  Result Value Ref Range Status   Specimen Description BLOOD RIGHT ANTECUBITAL  Final   Special Requests   Final    BOTTLES DRAWN AEROBIC AND ANAEROBIC Blood Culture results may not be optimal due to an inadequate volume of blood received in culture bottles   Culture   Final    NO GROWTH 5 DAYS Performed at [facility], [place] [address], [place], [place] [address]    Report Status 07/24/2020 FINAL  Final  Culture, Urine     Status: Abnormal   Collection Time: 07/20/20 10:51 PM   Specimen: Urine, Random  Result Value Ref Range Status   Specimen Description URINE, RANDOM  Final   Special Requests   Final    NONE Performed at [facility], [place] [address], [place], [place] [address]    Culture >=100,000 COLONIES/mL ENTEROCOCCUS FAECALIS (A)  Final   Report Status 07/23/2020 FINAL  Final   Organism ID, Bacteria ENTEROCOCCUS FAECALIS (A)  Final      Susceptibility   Enterococcus faecalis - MIC*    AMPICILLIN <=2 SENSITIVE Sensitive     NITROFURANTOIN <=16 SENSITIVE Sensitive     VANCOMYCIN 1 SENSITIVE Sensitive     * >=100,000 COLONIES/mL ENTEROCOCCUS FAECALIS     Radiology Studies: No results found. VAS [place] UPPER EXTREMITY VENOUS DUPLEX  Final Result    DG Chest [facility] 1 View  Final Result      Scheduled Meds: . (feeding supplement) PROSource Plus  30 mL Oral BID BM  .  Chlorhexidine Gluconate Cloth  6 each Topical Daily  . feeding supplement  237 mL Oral TID BM  . multivitamin with minerals  1 tablet Oral Daily  . pantoprazole (PROTONIX) IV  40 mg Intravenous Q24H  . sodium chloride flush  10-40 mL Intracatheter Q12H  . sodium chloride flush  3 mL Intravenous Q12H  . traMADol  50 mg Oral Q12H   PRN Meds: acetaminophen **OR** acetaminophen, albuterol, sodium chloride flush Continuous Infusions: . sodium chloride 75 mL/hr at 07/24/20 1142     LOS: 5 days  Time spent: Greater than 50% of the 35 minute visit was spent in counseling/coordination of care for the patient as laid out in the A&P.   Dwyane Dee, MD [facility] 07/24/2020, 12:44 PM

## 2020-07-24 NOTE — Progress Notes (Signed)
Patient refused bipap tonight

## 2020-07-24 NOTE — Progress Notes (Signed)
ANTICOAGULATION CONSULT NOTE   Pharmacy Consult for Warfarin Indication: atrial fibrillation  No Known Allergies  Patient Measurements:    Vital Signs: Temp: 98.1 F (36.7 C) (03/06 0539) Temp Source: Oral (03/06 0539) BP: 127/43 (03/06 0539)  Labs: Recent Labs    07/22/20 0441 07/22/20 1650 07/23/20 0412 07/24/20 0356  HGB 9.6* 8.2* 7.4* 7.8*  HCT 28.5* 26.5* 23.8* 24.2*  PLT 368 336 329 308  LABPROT 19.8*  --  19.6* 17.9*  INR 1.8*  --  1.7* 1.5*  CREATININE 1.16  --  1.01 0.91    CrCl cannot be calculated (Unknown ideal weight.).   Assessment: 84 YOM who presented on 3/1 with hypoxia and SOB. The patient was on warfarin PTA for hx Afib - held d/t concern for GIB w/ FOB+.  Originally, pharmacy was consulted to restart warfarin after clearance from GI.  Given decreasing hemoglobin 3/4, pharmacy now consulted to HOLD warfarin.   Admit INR 2.3 on PTA dose of 2.5 mg/day - given Vit K 1 mg x 1 on 3/2. INR today is SUBtherapeutic at 1.5.  Hemoglobin dropped yesterday from 9.6>7.4.  L upper extremity bruising- [place] neg, possibly from receiving AC.    Goal of Therapy:  INR 2-3 Monitor platelets by anticoagulation protocol: Yes   Plan:  -Continue to hold warfarin -Daily PT/INR -Daily CBC -F/u ability to restart warfarin   Rexford Maus, PharmD PGY-1 Acute Care Pharmacy Resident Office: [phone] 07/24/2020 1:04 PM

## 2020-07-25 DIAGNOSIS — I4821 Permanent atrial fibrillation: Secondary | ICD-10-CM | POA: Diagnosis not present

## 2020-07-25 DIAGNOSIS — A419 Sepsis, unspecified organism: Secondary | ICD-10-CM | POA: Diagnosis not present

## 2020-07-25 LAB — CBC WITH DIFFERENTIAL/PLATELET
Abs Immature Granulocytes: 0.16 10*3/uL — ABNORMAL HIGH (ref 0.00–0.07)
Basophils Absolute: 0.1 10*3/uL (ref 0.0–0.1)
Basophils Relative: 1 %
Eosinophils Absolute: 0.2 10*3/uL (ref 0.0–0.5)
Eosinophils Relative: 2 %
HCT: 23.6 % — ABNORMAL LOW (ref 39.0–52.0)
Hemoglobin: 7.6 g/dL — ABNORMAL LOW (ref 13.0–17.0)
Immature Granulocytes: 2 %
Lymphocytes Relative: 26 %
Lymphs Abs: 2.3 10*3/uL (ref 0.7–4.0)
MCH: 27.1 pg (ref 26.0–34.0)
MCHC: 32.2 g/dL (ref 30.0–36.0)
MCV: 84.3 fL (ref 80.0–100.0)
Monocytes Absolute: 0.8 10*3/uL (ref 0.1–1.0)
Monocytes Relative: 9 %
Neutro Abs: 5.2 10*3/uL (ref 1.7–7.7)
Neutrophils Relative %: 60 %
Platelets: 290 10*3/uL (ref 150–400)
RBC: 2.8 MIL/uL — ABNORMAL LOW (ref 4.22–5.81)
RDW: 15.7 % — ABNORMAL HIGH (ref 11.5–15.5)
WBC: 8.6 10*3/uL (ref 4.0–10.5)
nRBC: 0 % (ref 0.0–0.2)

## 2020-07-25 LAB — BASIC METABOLIC PANEL
Anion gap: 8 (ref 5–15)
BUN: 29 mg/dL — ABNORMAL HIGH (ref 8–23)
CO2: 17 mmol/L — ABNORMAL LOW (ref 22–32)
Calcium: 9.7 mg/dL (ref 8.9–10.3)
Chloride: 107 mmol/L (ref 98–111)
Creatinine, Ser: 0.87 mg/dL (ref 0.61–1.24)
GFR, Estimated: >60 mL/min (ref >60)
Glucose, Bld: 93 mg/dL (ref 70–99)
Potassium: 4.2 mmol/L (ref 3.5–5.1)
Sodium: 132 mmol/L — ABNORMAL LOW (ref 135–145)

## 2020-07-25 LAB — PROTIME-INR
INR: 1.5 — ABNORMAL HIGH (ref 0.8–1.2)
Prothrombin Time: 17.7 seconds — ABNORMAL HIGH (ref 11.4–15.2)

## 2020-07-25 LAB — MAGNESIUM: Magnesium: 1.6 mg/dL — ABNORMAL LOW (ref 1.7–2.4)

## 2020-07-25 NOTE — Plan of Care (Signed)
  Problem: Activity: Goal: Risk for activity intolerance will decrease Outcome: Not Progressing   Problem: Nutrition: Goal: Adequate nutrition will be maintained Outcome: Not Progressing   

## 2020-07-25 NOTE — Progress Notes (Signed)
Patient refused CPAP.

## 2020-07-25 NOTE — Progress Notes (Signed)
PROGRESS NOTE    Ruben Walker   LKG:[id number]  DOB: 1940-12-18  DOA: 07/19/2020     6  PCP: Tammi Sou, MD  CC: hypoxia, hypoTN  Hospital Course: Ruben Walker is a 80 yo male with history of HTN, HLD, chronic diastolic CHF, A. fib on Coumadin history of cardiac arrest s/p pacemaker/AICD, chronic respiratory failure on 2 L oxygen, CKD 3, and OSA on CPAP, poor historian, admitted with hypoxia and hypotension on 07/19/20.    In the ED, afebrile, pulse 59-72, respirations 18-33, blood pressure 86/39-163/143, and O2 saturations currently maintained on 2 L of nasal cannula oxygen.  Labs significant for WBC 16, hemoglobin 7.6, platelets 436, sodium 126, chloride 95, CO2 18, BUN 83, creatinine 1.89, albumin 1.8, and BNP 271.9.  Chest x-ray was significant for cardiac enlargement with bronchiectatic changes.  Urinalysis was positive for small leukocytes, many bacteria, 11-20 WBCs.  Patient had been given 500 mL of IV fluids   Patient was admitted for hypoxia and hypotension concerning for sepsis- thought secondary to a sacral wound that appears to be infected.  Noted to have AKI along with concern for complicated UTI due to chronic indwelling Foley.  Placed on empiric antibiotics of vancomycin and Rocephin.  Hemoglobin down to 7.6 g/dL with guaiac positive stools.  GI have been formally consulted and patient ordered to be transfused 1 unit of PRBCs. GS consulted for wound. Also noted large area of bruising on left UE, [place] negative for dvt. H/h dropped needing 1 unit PRBC transfusion 3/3.   Interval History:  Laying in bed in no distress.  Mentation remains slow.  No events overnight.  Does endorse some lower back pain consistent with his sacral decubitus ulcer.  Appears that he is mostly bedbound at this point in life.  ROS: Constitutional: negative for chills and fevers, Respiratory: negative for cough, Cardiovascular: negative for chest pain and Gastrointestinal: negative for abdominal  pain  Assessment & Plan: Sepsis 2/2 to infected sacral decubitus skin ulcer with cellulitis Complicated enterococcal UTI ESR CRP elevated.Urine culture with Enterococcus fecalis and blood culture with staph hominis-likely contamination.  - s/p vanc and rocephin; MRSA negative; discussed with ID; courses now considered complete; remains afebrile and leukocytosis has resolved   Sacral decubitus ulcer stage IV POA - Seen by surgery-appears clean; will continue to slough off; no indication for debridement per surgery - continue WTD BID dressing changes and for soiling  Permanent atrial fibrillation - holding coumadin still; Hgb 7.8>>7.6 g/dL and appears to be stabilizing; watch for 1 more day; if stable on 3/8, can resume Coumadin and pursue discharge  Acute on chronic hypoxic respiratory failure - hypoxic on presentation chest x-ray chronic bronchitis changes, continue supplemental oxygen, bronchodilators  Normocytic Anemia  - multifactorial: ABLA, LUE hematoma, chronically ill, possible GIB (no interventions at this time per GI) - hold coumadin still - LUE negative for DVT  Left upper extremity bruise - etiology likely anticoagulation with some possible trauma (slept on left side?) - negative DVT on duplex 3/2 - supportive care   AKI on CKD stage IIIb - patient has history of CKD3b. Baseline creat ~ 1.8, eGFR 35-40 - patient presents with increase in creat >0.3 mg/dL above baseline presumed to have occurred within past 7 days PTA  Chronic diastolic CHF - Patient is euvolemic - no s/s exacerbation   Hyponatremia  - 126 on admission suspected hypovolemic - improved with IVF  History of cardiac arrest post AICD/pacemaker History of COVID infection 06/14/2020  Generalized weakness/debility/deconditioning continue PT OT.  Has had frequent falls.  Been basically bedbound.  Encourage PT OT  OSA-continue CPAP/?bipap at snf  Severe protein calorie malnutrition -  1.2 prealbumin;  continue to augment diet.  At risk of decompensation. - related to chronic illness as evidenced by severe muscle depletion,severe fat depletion - continue diet and supplements   Old records reviewed in assessment of this patient  Antimicrobials: Rocephin 07/19/20 >> 07/22/20 Vanc 3/1 - 3/5  DVT prophylaxis: SCDs Start: 07/19/20 1557   Code Status:   Code Status: DNR Family Communication: none present  Disposition Plan: Status is: Inpatient  Remains inpatient appropriate because:Inpatient level of care appropriate due to severity of illness   Dispo: The patient is from: SNF              Anticipated d/c is to: SNF              Patient currently is not medically stable to d/c.   Difficult to place patient No  Risk of unplanned readmission score: Unplanned Admission- Pilot do not use: 27.01   Objective: Blood pressure (!) 94/30, pulse 70, temperature 98.2 F (36.8 C), resp. rate 19, SpO2 99 %.  Examination: General appearance: alert, cooperative, no distress and slowed mentation Head: Normocephalic, without obvious abnormality, atraumatic Eyes: EOMI Lungs: clear to auscultation bilaterally Heart: irregularly irregular rhythm and S1, S2 normal Abdomen: obese, soft, ND, NT Extremities: trace LE edema Skin: mobility and turgor normal Neurologic: follows commands; moves all 4 extremities  Consultants:   GI  Procedures:   n/a  Data Reviewed: I have personally reviewed following labs and imaging studies Results for orders placed or performed during the hospital encounter of 07/19/20 (from the past 24 hour(s))  Protime-INR     Status: Abnormal   Collection Time: 07/25/20  3:15 AM  Result Value Ref Range   Prothrombin Time 17.7 (H) 11.4 - 15.2 seconds   INR 1.5 (H) 0.8 - 1.2  Basic metabolic panel     Status: Abnormal   Collection Time: 07/25/20  3:15 AM  Result Value Ref Range   Sodium 132 (L) 135 - 145 mmol/L   Potassium 4.2 3.5 - 5.1 mmol/L   Chloride 107 98 - 111  mmol/L   CO2 17 (L) 22 - 32 mmol/L   Glucose, Bld 93 70 - 99 mg/dL   BUN 29 (H) 8 - 23 mg/dL   Creatinine, Ser 0.87 0.61 - 1.24 mg/dL   Calcium 9.7 8.9 - 10.3 mg/dL   GFR, Estimated >60 >60 mL/min   Anion gap 8 5 - 15  CBC with Differential/Platelet     Status: Abnormal   Collection Time: 07/25/20  3:15 AM  Result Value Ref Range   WBC 8.6 4.0 - 10.5 K/uL   RBC 2.80 (L) 4.22 - 5.81 MIL/uL   Hemoglobin 7.6 (L) 13.0 - 17.0 g/dL   HCT 23.6 (L) 39.0 - 52.0 %   MCV 84.3 80.0 - 100.0 fL   MCH 27.1 26.0 - 34.0 pg   MCHC 32.2 30.0 - 36.0 g/dL   RDW 15.7 (H) 11.5 - 15.5 %   Platelets 290 150 - 400 K/uL   nRBC 0.0 0.0 - 0.2 %   Neutrophils Relative % 60 %   Neutro Abs 5.2 1.7 - 7.7 K/uL   Lymphocytes Relative 26 %   Lymphs Abs 2.3 0.7 - 4.0 K/uL   Monocytes Relative 9 %   Monocytes Absolute 0.8 0.1 - 1.0 K/uL  Eosinophils Relative 2 %   Eosinophils Absolute 0.2 0.0 - 0.5 K/uL   Basophils Relative 1 %   Basophils Absolute 0.1 0.0 - 0.1 K/uL   Immature Granulocytes 2 %   Abs Immature Granulocytes 0.16 (H) 0.00 - 0.07 K/uL  Magnesium     Status: Abnormal   Collection Time: 07/25/20  3:15 AM  Result Value Ref Range   Magnesium 1.6 (L) 1.7 - 2.4 mg/dL    Recent Results (from the past 240 hour(s))  Culture, blood (x 2)     Status: Abnormal   Collection Time: 07/19/20  5:37 PM   Specimen: BLOOD  Result Value Ref Range Status   Specimen Description BLOOD  Final   Special Requests   Final    RIGHT IJ BOTTLES DRAWN AEROBIC AND ANAEROBIC Blood Culture results may not be optimal due to an inadequate volume of blood received in culture bottles   Culture  Setup Time   Final    GRAM POSITIVE COCCI IN CLUSTERS IN BOTH AEROBIC AND ANAEROBIC BOTTLES Organism ID to follow CRITICAL RESULT CALLED TO, READ BACK BY AND VERIFIED WITH: H VON [facility] PHARMD 2056 07/20/20 A BROWNING    Culture (A)  Final    STAPHYLOCOCCUS HOMINIS THE SIGNIFICANCE OF ISOLATING THIS ORGANISM FROM A SINGLE SET OF BLOOD  CULTURES WHEN MULTIPLE SETS ARE DRAWN IS UNCERTAIN. PLEASE NOTIFY THE MICROBIOLOGY DEPARTMENT WITHIN ONE WEEK IF SPECIATION AND SENSITIVITIES ARE REQUIRED. Performed at [facility], [place] [address] [place], [place] [address]    Report Status 07/22/2020 FINAL  Final  Blood Culture ID Panel (Reflexed)     Status: Abnormal   Collection Time: 07/19/20  5:37 PM  Result Value Ref Range Status   Enterococcus faecalis NOT DETECTED NOT DETECTED Final   Enterococcus Faecium NOT DETECTED NOT DETECTED Final   Listeria monocytogenes NOT DETECTED NOT DETECTED Final   Staphylococcus species DETECTED (A) NOT DETECTED Final    Comment: CRITICAL RESULT CALLED TO, READ BACK BY AND VERIFIED WITH: H VON [facility] PHARMD 2056 07/20/20 A BROWNING    Staphylococcus aureus (BCID) NOT DETECTED NOT DETECTED Final   Staphylococcus epidermidis NOT DETECTED NOT DETECTED Final   Staphylococcus lugdunensis NOT DETECTED NOT DETECTED Final   Streptococcus species NOT DETECTED NOT DETECTED Final   Streptococcus agalactiae NOT DETECTED NOT DETECTED Final   Streptococcus pneumoniae NOT DETECTED NOT DETECTED Final   Streptococcus pyogenes NOT DETECTED NOT DETECTED Final   A.calcoaceticus-baumannii NOT DETECTED NOT DETECTED Final   Bacteroides fragilis NOT DETECTED NOT DETECTED Final   Enterobacterales NOT DETECTED NOT DETECTED Final   Enterobacter cloacae complex NOT DETECTED NOT DETECTED Final   Escherichia coli NOT DETECTED NOT DETECTED Final   Klebsiella aerogenes NOT DETECTED NOT DETECTED Final   Klebsiella oxytoca NOT DETECTED NOT DETECTED Final   Klebsiella pneumoniae NOT DETECTED NOT DETECTED Final   Proteus species NOT DETECTED NOT DETECTED Final   Salmonella species NOT DETECTED NOT DETECTED Final   Serratia marcescens NOT DETECTED NOT DETECTED Final   Haemophilus influenzae NOT DETECTED NOT DETECTED Final   Neisseria meningitidis NOT DETECTED NOT DETECTED Final   Pseudomonas aeruginosa NOT DETECTED NOT  DETECTED Final   Stenotrophomonas maltophilia NOT DETECTED NOT DETECTED Final   Candida albicans NOT DETECTED NOT DETECTED Final   Candida auris NOT DETECTED NOT DETECTED Final   Candida glabrata NOT DETECTED NOT DETECTED Final   Candida krusei NOT DETECTED NOT DETECTED Final   Candida parapsilosis NOT DETECTED NOT DETECTED Final  Candida tropicalis NOT DETECTED NOT DETECTED Final   Cryptococcus neoformans/gattii NOT DETECTED NOT DETECTED Final    Comment: Performed at [facility], [place] [address], [place], [place] [address]  MRSA PCR Screening     Status: None   Collection Time: 07/19/20  9:43 PM   Specimen: Nasal Mucosa; Nasopharyngeal  Result Value Ref Range Status   MRSA by PCR NEGATIVE NEGATIVE Final    Comment:        The GeneXpert MRSA Assay (FDA approved for NASAL specimens only), is one component of a comprehensive MRSA colonization surveillance program. It is not intended to diagnose MRSA infection nor to guide or monitor treatment for MRSA infections. Performed at [facility], [place] [address] [place], [place] [address]   Culture, blood (x 2)     Status: None   Collection Time: 07/19/20  9:48 PM   Specimen: BLOOD  Result Value Ref Range Status   Specimen Description BLOOD RIGHT ANTECUBITAL  Final   Special Requests   Final    BOTTLES DRAWN AEROBIC AND ANAEROBIC Blood Culture results may not be optimal due to an inadequate volume of blood received in culture bottles   Culture   Final    NO GROWTH 5 DAYS Performed at [facility], [place] [address], [place], [place] [address]    Report Status 07/24/2020 FINAL  Final  Culture, Urine     Status: Abnormal   Collection Time: 07/20/20 10:51 PM   Specimen: Urine, Random  Result Value Ref Range Status   Specimen Description URINE, RANDOM  Final   Special Requests   Final    NONE Performed at [facility], [place] [address], [place],  [address]    Culture >=100,000 COLONIES/mL ENTEROCOCCUS  FAECALIS (A)  Final   Report Status 07/23/2020 FINAL  Final   Organism ID, Bacteria ENTEROCOCCUS FAECALIS (A)  Final      Susceptibility   Enterococcus faecalis - MIC*    AMPICILLIN <=2 SENSITIVE Sensitive     NITROFURANTOIN <=16 SENSITIVE Sensitive     VANCOMYCIN 1 SENSITIVE Sensitive     * >=100,000 COLONIES/mL ENTEROCOCCUS FAECALIS     Radiology Studies: No results found. VAS [place] UPPER EXTREMITY VENOUS DUPLEX  Final Result    DG Chest [facility] 1 View  Final Result      Scheduled Meds: . (feeding supplement) PROSource Plus  30 mL Oral BID BM  . Chlorhexidine Gluconate Cloth  6 each Topical Daily  . feeding supplement  237 mL Oral TID BM  . multivitamin with minerals  1 tablet Oral Daily  . pantoprazole (PROTONIX) IV  40 mg Intravenous Q24H  . sodium chloride flush  10-40 mL Intracatheter Q12H  . sodium chloride flush  3 mL Intravenous Q12H  . traMADol  50 mg Oral Q12H   PRN Meds: acetaminophen **OR** acetaminophen, albuterol, sodium chloride flush Continuous Infusions: . sodium chloride 75 mL/hr at 07/24/20 1142     LOS: 6 days  Time spent: Greater than 50% of the 35 minute visit was spent in counseling/coordination of care for the patient as laid out in the A&P.   Dwyane Dee, MD [facility] 07/25/2020, 12:34 PM

## 2020-07-26 ENCOUNTER — Ambulatory Visit: Payer: Medicare Other | Admitting: Family Medicine

## 2020-07-26 DIAGNOSIS — N1832 Chronic kidney disease, stage 3b: Secondary | ICD-10-CM | POA: Diagnosis not present

## 2020-07-26 DIAGNOSIS — L89154 Pressure ulcer of sacral region, stage 4: Secondary | ICD-10-CM | POA: Diagnosis not present

## 2020-07-26 DIAGNOSIS — Z7401 Bed confinement status: Secondary | ICD-10-CM | POA: Diagnosis not present

## 2020-07-26 DIAGNOSIS — D638 Anemia in other chronic diseases classified elsewhere: Secondary | ICD-10-CM | POA: Diagnosis present

## 2020-07-26 DIAGNOSIS — E871 Hypo-osmolality and hyponatremia: Secondary | ICD-10-CM | POA: Diagnosis present

## 2020-07-26 DIAGNOSIS — Z515 Encounter for palliative care: Secondary | ICD-10-CM | POA: Diagnosis not present

## 2020-07-26 DIAGNOSIS — E785 Hyperlipidemia, unspecified: Secondary | ICD-10-CM | POA: Diagnosis not present

## 2020-07-26 DIAGNOSIS — R159 Full incontinence of feces: Secondary | ICD-10-CM | POA: Diagnosis not present

## 2020-07-26 DIAGNOSIS — R410 Disorientation, unspecified: Secondary | ICD-10-CM | POA: Diagnosis not present

## 2020-07-26 DIAGNOSIS — I4821 Permanent atrial fibrillation: Secondary | ICD-10-CM | POA: Diagnosis not present

## 2020-07-26 DIAGNOSIS — R791 Abnormal coagulation profile: Secondary | ICD-10-CM | POA: Diagnosis not present

## 2020-07-26 DIAGNOSIS — I4891 Unspecified atrial fibrillation: Secondary | ICD-10-CM | POA: Diagnosis not present

## 2020-07-26 DIAGNOSIS — M2550 Pain in unspecified joint: Secondary | ICD-10-CM | POA: Diagnosis not present

## 2020-07-26 DIAGNOSIS — R0902 Hypoxemia: Secondary | ICD-10-CM | POA: Diagnosis not present

## 2020-07-26 DIAGNOSIS — R5381 Other malaise: Secondary | ICD-10-CM | POA: Diagnosis not present

## 2020-07-26 DIAGNOSIS — L89150 Pressure ulcer of sacral region, unstageable: Secondary | ICD-10-CM | POA: Diagnosis not present

## 2020-07-26 DIAGNOSIS — I442 Atrioventricular block, complete: Secondary | ICD-10-CM | POA: Diagnosis present

## 2020-07-26 DIAGNOSIS — G9341 Metabolic encephalopathy: Secondary | ICD-10-CM | POA: Diagnosis not present

## 2020-07-26 DIAGNOSIS — R627 Adult failure to thrive: Secondary | ICD-10-CM | POA: Diagnosis not present

## 2020-07-26 DIAGNOSIS — J9611 Chronic respiratory failure with hypoxia: Secondary | ICD-10-CM | POA: Diagnosis not present

## 2020-07-26 DIAGNOSIS — R509 Fever, unspecified: Secondary | ICD-10-CM | POA: Diagnosis not present

## 2020-07-26 DIAGNOSIS — N390 Urinary tract infection, site not specified: Secondary | ICD-10-CM | POA: Diagnosis not present

## 2020-07-26 DIAGNOSIS — R6520 Severe sepsis without septic shock: Secondary | ICD-10-CM | POA: Diagnosis not present

## 2020-07-26 DIAGNOSIS — R404 Transient alteration of awareness: Secondary | ICD-10-CM | POA: Diagnosis not present

## 2020-07-26 DIAGNOSIS — T373X5A Adverse effect of other antiprotozoal drugs, initial encounter: Secondary | ICD-10-CM | POA: Diagnosis not present

## 2020-07-26 DIAGNOSIS — L89610 Pressure ulcer of right heel, unstageable: Secondary | ICD-10-CM | POA: Diagnosis not present

## 2020-07-26 DIAGNOSIS — R001 Bradycardia, unspecified: Secondary | ICD-10-CM | POA: Diagnosis not present

## 2020-07-26 DIAGNOSIS — R4182 Altered mental status, unspecified: Secondary | ICD-10-CM | POA: Diagnosis present

## 2020-07-26 DIAGNOSIS — Z7901 Long term (current) use of anticoagulants: Secondary | ICD-10-CM | POA: Diagnosis not present

## 2020-07-26 DIAGNOSIS — L89893 Pressure ulcer of other site, stage 3: Secondary | ICD-10-CM | POA: Diagnosis present

## 2020-07-26 DIAGNOSIS — Z20822 Contact with and (suspected) exposure to covid-19: Secondary | ICD-10-CM | POA: Diagnosis present

## 2020-07-26 DIAGNOSIS — D62 Acute posthemorrhagic anemia: Secondary | ICD-10-CM | POA: Diagnosis present

## 2020-07-26 DIAGNOSIS — I480 Paroxysmal atrial fibrillation: Secondary | ICD-10-CM | POA: Diagnosis not present

## 2020-07-26 DIAGNOSIS — Z66 Do not resuscitate: Secondary | ICD-10-CM | POA: Diagnosis present

## 2020-07-26 DIAGNOSIS — I5032 Chronic diastolic (congestive) heart failure: Secondary | ICD-10-CM | POA: Diagnosis present

## 2020-07-26 DIAGNOSIS — M6281 Muscle weakness (generalized): Secondary | ICD-10-CM | POA: Diagnosis not present

## 2020-07-26 DIAGNOSIS — L03319 Cellulitis of trunk, unspecified: Secondary | ICD-10-CM | POA: Diagnosis not present

## 2020-07-26 DIAGNOSIS — L89312 Pressure ulcer of right buttock, stage 2: Secondary | ICD-10-CM | POA: Diagnosis not present

## 2020-07-26 DIAGNOSIS — J9621 Acute and chronic respiratory failure with hypoxia: Secondary | ICD-10-CM | POA: Diagnosis not present

## 2020-07-26 DIAGNOSIS — R6883 Chills (without fever): Secondary | ICD-10-CM | POA: Diagnosis not present

## 2020-07-26 DIAGNOSIS — L89313 Pressure ulcer of right buttock, stage 3: Secondary | ICD-10-CM | POA: Diagnosis not present

## 2020-07-26 DIAGNOSIS — R0602 Shortness of breath: Secondary | ICD-10-CM | POA: Diagnosis not present

## 2020-07-26 DIAGNOSIS — M47816 Spondylosis without myelopathy or radiculopathy, lumbar region: Secondary | ICD-10-CM | POA: Diagnosis not present

## 2020-07-26 DIAGNOSIS — I130 Hypertensive heart and chronic kidney disease with heart failure and stage 1 through stage 4 chronic kidney disease, or unspecified chronic kidney disease: Secondary | ICD-10-CM | POA: Diagnosis present

## 2020-07-26 DIAGNOSIS — N179 Acute kidney failure, unspecified: Secondary | ICD-10-CM | POA: Diagnosis not present

## 2020-07-26 DIAGNOSIS — E860 Dehydration: Secondary | ICD-10-CM | POA: Diagnosis not present

## 2020-07-26 DIAGNOSIS — R5383 Other fatigue: Secondary | ICD-10-CM | POA: Diagnosis not present

## 2020-07-26 DIAGNOSIS — R52 Pain, unspecified: Secondary | ICD-10-CM | POA: Diagnosis not present

## 2020-07-26 DIAGNOSIS — L0390 Cellulitis, unspecified: Secondary | ICD-10-CM | POA: Diagnosis present

## 2020-07-26 DIAGNOSIS — G4733 Obstructive sleep apnea (adult) (pediatric): Secondary | ICD-10-CM | POA: Diagnosis not present

## 2020-07-26 DIAGNOSIS — A419 Sepsis, unspecified organism: Secondary | ICD-10-CM | POA: Diagnosis not present

## 2020-07-26 DIAGNOSIS — Z95810 Presence of automatic (implantable) cardiac defibrillator: Secondary | ICD-10-CM | POA: Diagnosis not present

## 2020-07-26 DIAGNOSIS — Z9981 Dependence on supplemental oxygen: Secondary | ICD-10-CM | POA: Diagnosis not present

## 2020-07-26 DIAGNOSIS — E861 Hypovolemia: Secondary | ICD-10-CM | POA: Diagnosis present

## 2020-07-26 DIAGNOSIS — R0689 Other abnormalities of breathing: Secondary | ICD-10-CM | POA: Diagnosis not present

## 2020-07-26 DIAGNOSIS — E43 Unspecified severe protein-calorie malnutrition: Secondary | ICD-10-CM | POA: Diagnosis present

## 2020-07-26 DIAGNOSIS — Z8616 Personal history of COVID-19: Secondary | ICD-10-CM | POA: Diagnosis not present

## 2020-07-26 DIAGNOSIS — Z7189 Other specified counseling: Secondary | ICD-10-CM | POA: Diagnosis not present

## 2020-07-26 DIAGNOSIS — G4730 Sleep apnea, unspecified: Secondary | ICD-10-CM | POA: Diagnosis not present

## 2020-07-26 DIAGNOSIS — F419 Anxiety disorder, unspecified: Secondary | ICD-10-CM | POA: Diagnosis not present

## 2020-07-26 DIAGNOSIS — I959 Hypotension, unspecified: Secondary | ICD-10-CM | POA: Diagnosis not present

## 2020-07-26 DIAGNOSIS — I10 Essential (primary) hypertension: Secondary | ICD-10-CM | POA: Diagnosis not present

## 2020-07-26 DIAGNOSIS — S81811A Laceration without foreign body, right lower leg, initial encounter: Secondary | ICD-10-CM | POA: Diagnosis not present

## 2020-07-26 DIAGNOSIS — L89626 Pressure-induced deep tissue damage of left heel: Secondary | ICD-10-CM | POA: Diagnosis not present

## 2020-07-26 LAB — CBC WITH DIFFERENTIAL/PLATELET
Abs Immature Granulocytes: 0.14 10*3/uL — ABNORMAL HIGH (ref 0.00–0.07)
Basophils Absolute: 0.1 10*3/uL (ref 0.0–0.1)
Basophils Relative: 1 %
Eosinophils Absolute: 0.2 10*3/uL (ref 0.0–0.5)
Eosinophils Relative: 2 %
HCT: 25.3 % — ABNORMAL LOW (ref 39.0–52.0)
Hemoglobin: 8.1 g/dL — ABNORMAL LOW (ref 13.0–17.0)
Immature Granulocytes: 1 %
Lymphocytes Relative: 26 %
Lymphs Abs: 2.8 10*3/uL (ref 0.7–4.0)
MCH: 27.5 pg (ref 26.0–34.0)
MCHC: 32 g/dL (ref 30.0–36.0)
MCV: 85.8 fL (ref 80.0–100.0)
Monocytes Absolute: 0.8 10*3/uL (ref 0.1–1.0)
Monocytes Relative: 8 %
Neutro Abs: 6.7 10*3/uL (ref 1.7–7.7)
Neutrophils Relative %: 62 %
Platelets: 300 10*3/uL (ref 150–400)
RBC: 2.95 MIL/uL — ABNORMAL LOW (ref 4.22–5.81)
RDW: 15.9 % — ABNORMAL HIGH (ref 11.5–15.5)
WBC: 10.8 10*3/uL — ABNORMAL HIGH (ref 4.0–10.5)
nRBC: 0 % (ref 0.0–0.2)

## 2020-07-26 LAB — BASIC METABOLIC PANEL
Anion gap: 6 (ref 5–15)
BUN: 27 mg/dL — ABNORMAL HIGH (ref 8–23)
CO2: 19 mmol/L — ABNORMAL LOW (ref 22–32)
Calcium: 10 mg/dL (ref 8.9–10.3)
Chloride: 109 mmol/L (ref 98–111)
Creatinine, Ser: 0.89 mg/dL (ref 0.61–1.24)
GFR, Estimated: >60 mL/min (ref >60)
Glucose, Bld: 88 mg/dL (ref 70–99)
Potassium: 4.4 mmol/L (ref 3.5–5.1)
Sodium: 134 mmol/L — ABNORMAL LOW (ref 135–145)

## 2020-07-26 LAB — PROTIME-INR
INR: 1.4 — ABNORMAL HIGH (ref 0.8–1.2)
Prothrombin Time: 16.7 seconds — ABNORMAL HIGH (ref 11.4–15.2)

## 2020-07-26 LAB — RESP PANEL BY RT-PCR (FLU A&B, COVID) ARPGX2
Influenza A by PCR: NEGATIVE
Influenza B by PCR: NEGATIVE
SARS Coronavirus 2 by RT PCR: NEGATIVE

## 2020-07-26 LAB — MAGNESIUM: Magnesium: 1.7 mg/dL (ref 1.7–2.4)

## 2020-07-26 MED ORDER — WARFARIN - PHARMACIST DOSING INPATIENT: Freq: Every day | Status: DC

## 2020-07-26 MED ORDER — WARFARIN SODIUM 2.5 MG PO TABS
2.5000 mg | ORAL_TABLET | Freq: Once | ORAL | Status: AC
  Administered 2020-07-26: 2.5 mg via ORAL
  Filled 2020-07-26: qty 1

## 2020-07-26 MED ORDER — ENSURE ENLIVE PO LIQD: 237.0000 mL | Freq: Three times a day (TID) | ORAL | Status: DC

## 2020-07-26 MED ORDER — PROSOURCE PLUS PO LIQD: 30.0000 mL | Freq: Two times a day (BID) | ORAL | Status: DC

## 2020-07-26 NOTE — Progress Notes (Signed)
Ruben Walker to be discharged [facility]  per MD order. Patient verbalized understanding.  Patient has foley catheter in place per MD's order.   Discharge packet assembled. An After Visit Summary (AVS) was printed and given to the EMS personnel. Patient escorted via stretcher and discharged to [facility] via ambulance. Report called to accepting facility; all questions and concerns addressed.   Johnn Hai, RN

## 2020-07-26 NOTE — Discharge Summary (Addendum)
Physician Discharge Summary  [name] ZOX:[id number] DOB: 01/23/1941 DOA: 07/19/2020  PCP: Jeoffrey MassedMcGowen, Ruben H, MD  Admit date: 07/19/2020 Discharge date: 07/26/2020  Admitted From: Skilled nursing facility  Discharge disposition: Skilled nursing facility   Recommendations for Outpatient Follow-Up:   Follow up with your primary care provider at the skilled nursing facility in 3 to 5 days Check CBC, BMP, magnesium in the next visit Monitor Coumadin levels closely with INR in 2 to 3 days.  INR goal of 2-3   Discharge Diagnosis:   Principal Problem:   Sepsis (HCC) Active Problems:   Permanent atrial fibrillation (HCC)   Chronic diastolic heart failure (HCC)   Decubitus skin ulcer   Acute renal failure superimposed on stage 3b chronic kidney disease (HCC)   Severe protein-calorie malnutrition Ruben Walker(Gomez: less than 60% of standard weight) (HCC)   Hyponatremia   History of COVID-19   Acute on chronic respiratory failure with hypoxia (HCC)   Complicated urinary tract infection   Cellulitis of sacral region    Discharge Condition: Improved.  Diet recommendation: Low sodium, heart healthy.    Wound care: Decubitus ulceration care.  Code status: Full.   History of Present Illness:   Mr. Ruben BollmanHanson is a 80 yo male with history of hypertension, hyperlipidemia, chronic diastolic heart failure, atrial fibrillation on Coumadin, history of cardiac arrest status post pacemaker placement, chronic respiratory failure on 2 L of oxygen, CKD 3, and OSA on CPAP, was admitted to hospital for hypoxia and hypotension concerning for sepsis on 07/19/2020.  In the ED patient was afebrile with hypotension on 2 L of oxygen by nasal cannula.  He did have mild leukocytosis and hemoglobin was low at 7.6 creatinine was elevated at 1.8 with albumin of 1.8.  Chest x-ray was suggestive of cardiac enlargement with bronchiectasis.  UA was positive for small leukocytes and multiple white cells.  Patient was given 500  mL of IV fluid bolus and was admitted to hospital for sepsis thought to be secondary to UTI and sacral wound.  He did have mild acute kidney injury.  Patient does have chronic indwelling Foley catheter.     During hospitalization, initially received vancomycin and Rocephin.  Patient also received 1 unit of packed RBC due to low hemoglobin and GI was consulted.  He was noted to have a large area of bruising on the left upper extremity but ultrasound was negative for DVT.  General surgery was consulted for his  sacral decubitus wound.    Hospital Course:   Following conditions were addressed during hospitalization as listed below,  Sepsis 2/2 to infected sacral decubitus skin ulcer with cellulitis, Complicated enterococcal UTI Urine culture with Enterococcus fecalis and blood culture with staph hominis-likely contamination.  He received vancomycin and Rocephin which he has completed.  Previous provider had spoken with ID.  At this time, patient is afebrile and leukocytosis has resolved.  Complicated UTI likely secondary to chronic indwelling Foley catheter. Treated while in the hospital  Sacral decubitus ulcer stage IV, admission. -Patient was seen by general surgery.  Dressing was advised with no need for debridement.  Continue dressing on discharge   Permanent atrial fibrillation -Coumadin was on hold due to lower hemoglobin.  Has trended up to 8.1 today.  Plan was to initiate Coumadin starting today and will continue to monitor closely.     chronic hypoxic respiratory failure Chest x-ray with bronchiectasis.  Continue supportive care   Normocytic Anemia  Likely multifactorial from acute blood loss anemia,  left upper extremity hematoma, will resume Coumadin at this time since hemoglobin has remained stable.  Left upper extremity negative for DVT.     Left upper extremity hematoma. - etiology likely anticoagulation with some possible trauma (slept on left side). Negative DVT on duplex  3/2.chest hemoglobin is stable.  Will resume Coumadin.   AKI on CKD stage IIIb -Improved.  Latest creatinine of 0.8.    Follow-up as outpatient.   Chronic diastolic CHF Patient does have erythema but no acute dyspnea.   Hyponatremia  Initially thought to be hypovolemic.  Off IV fluids at this time.  Sodium of 134.   History of cardiac arrest post AICD/pacemaker.  Continue supportive care.   History of COVID infection 06/14/2020.  Continue on supplemental oxygen.   Generalized weakness/debility/deconditioning    On chronic Foley catheter.  History of frequent falls.  Basically bedbound.  Continue PT OT, recommendation is skilled nursing facility on discharge.   OSA-continue CPAP/ bipap at skilled nursing facility.   Severe protein calorie malnutrition - related to chronic illness as evidenced by severe muscle depletion,severe fat depletion - continue diet and supplements  Disposition.  At this time, patient is stable for disposition to skilled nursing facility.  Spoke with the patient's nephew on the phone and updated him about the plan for disposition.  Medical Consultants:   GI  Procedures:    None Subjective:   Today, patient patient was seen and examined at bedside.  Patient denies any nausea vomiting has not had a bowel movement or bleeding.  Denies any shortness of breath cough or chest pain.  Discharge Exam:   Vitals:   07/26/20 0513 07/26/20 1042  BP: (!) 144/53 100/74  Pulse: 71 98  Resp: 16 19  Temp: 98.5 F (36.9 C) 98.3 F (36.8 C)  SpO2: 98% 96%   Vitals:   07/25/20 1836 07/25/20 2103 07/26/20 0513 07/26/20 1042  BP: (!) 110/98 (!) 102/43 (!) 144/53 100/74  Pulse: 70 70 71 98  Resp: 18 16 16 19   Temp: 98.3 F (36.8 C) 98.2 F (36.8 C) 98.5 F (36.9 C) 98.3 F (36.8 C)  TempSrc:      SpO2: 96% 96% 98% 96%   GENERAL: Patient is alert awake and oriented. Not in obvious distress.,  Chronically ill appearing HENT: No scleral pallor or icterus.  Pupils equally reactive to light. Oral mucosa is moist NECK: is supple, no gross swelling noted. CHEST: Clear to auscultation. No crackles or wheezes.  Diminished breath sounds bilaterally. CVS: S1 and S2 heard, no murmur.  Irregular irregular rhythm, ABDOMEN: Soft, non-tender, bowel sounds are present.  Chronic indwelling Foley catheter in place EXTREMITIES: Lower extremity edema noted, left upper extremity edema, midline in the right upper extremity CNS: Cranial nerves are intact.  Moves extremities. SKIN: warm and dry, pressure ulceration stage II on both buttocks present on admission, deep tissue injury left heel, unstageable ulceration over the sacrum.-Present on admission.  The results of significant diagnostics from this hospitalization (including imaging, microbiology, ancillary and laboratory) are listed below for reference.     Diagnostic Studies:   DG Chest Port 1 View  Result Date: 07/19/2020 CLINICAL DATA:  Cough, code sepsis EXAM: PORTABLE CHEST 1 VIEW COMPARISON:  Portable exam 1336 hours compared to 06/17/2020 FINDINGS: LEFT subclavian ICD with lead projecting over RIGHT ventricle. Enlargement of cardiac silhouette with slight vascular congestion. Atherosclerotic calcification aorta. Lungs grossly clear. Mild central peribronchial thickening again noted. No definite acute infiltrate, pleural effusion or pneumothorax. Bones  demineralized. IMPRESSION: Enlargement of cardiac silhouette post ICD. Mild bronchitic changes without infiltrate. Electronically Signed   By: Ulyses Southward M.D.   On: 07/19/2020 14:18   VAS [place] UPPER EXTREMITY VENOUS DUPLEX  Result Date: 07/20/2020 UPPER VENOUS STUDY  Indications: upper arm bruise Limitations: Patient unable to position properly for exam. Comparison Study: no previous Performing Technologist: Clint Guy RVT  Examination Guidelines: A complete evaluation includes B-mode imaging, spectral Doppler, color Doppler, and power Doppler as needed of all  accessible portions of each vessel. Bilateral testing is considered an integral part of a complete examination. Limited examinations for reoccurring indications may be performed as noted.  Left Findings: +----------+------------+---------+-----------+----------+-------+ LEFT      CompressiblePhasicitySpontaneousPropertiesSummary +----------+------------+---------+-----------+----------+-------+ IJV           Full       Yes       Yes                      +----------+------------+---------+-----------+----------+-------+ Subclavian               Yes       Yes                      +----------+------------+---------+-----------+----------+-------+ Axillary      Full       Yes       Yes                      +----------+------------+---------+-----------+----------+-------+ Brachial      Full                                          +----------+------------+---------+-----------+----------+-------+ Radial        Full                                          +----------+------------+---------+-----------+----------+-------+ Ulnar         Full                                          +----------+------------+---------+-----------+----------+-------+ Cephalic      Full                                          +----------+------------+---------+-----------+----------+-------+ Basilic       Full                                          +----------+------------+---------+-----------+----------+-------+  Summary:  Left: No evidence of deep vein thrombosis in the upper extremity. No evidence of superficial vein thrombosis in the upper extremity.  *See table(s) above for measurements and observations.  Diagnosing physician: Coral Else MD Electronically signed by Coral Else MD on 07/20/2020 at 9:25:54 PM.    Final      Labs:   Basic Metabolic Panel: Recent Labs  Lab 07/22/20 0441 07/23/20 0412 07/24/20 0356 07/25/20 0315 07/26/20 0653  NA 132* 132*  132* 132* 134*  K 4.1 4.0  4.2 4.2 4.4  CL 100 106 105 107 109  CO2 21* 19* 19* 17* 19*  GLUCOSE 71 103* 86 93 88  BUN 49* 38* 33* 29* 27*  CREATININE 1.16 1.01 0.91 0.87 0.89  CALCIUM 9.7 9.5 9.5 9.7 10.0  MG  --   --   --  1.6* 1.7   GFR CrCl cannot be calculated (Unknown ideal weight.). Liver Function Tests: No results for input(s): AST, ALT, ALKPHOS, BILITOT, PROT, ALBUMIN in the last 168 hours. No results for input(s): LIPASE, AMYLASE in the last 168 hours. No results for input(s): AMMONIA in the last 168 hours. Coagulation profile Recent Labs  Lab 07/22/20 0441 07/23/20 0412 07/24/20 0356 07/25/20 0315 07/26/20 0653  INR 1.8* 1.7* 1.5* 1.5* 1.4*    CBC: Recent Labs  Lab 07/22/20 1650 07/23/20 0412 07/24/20 0356 07/25/20 0315 07/26/20 0653  WBC 12.4* 10.0 9.5 8.6 10.8*  NEUTROABS  --   --   --  5.2 6.7  HGB 8.2* 7.4* 7.8* 7.6* 8.1*  HCT 26.5* 23.8* 24.2* 23.6* 25.3*  MCV 85.2 84.7 84.3 84.3 85.8  PLT 336 329 308 290 300   Cardiac Enzymes: No results for input(s): CKTOTAL, CKMB, CKMBINDEX, TROPONINI in the last 168 hours. BNP: Invalid input(s): POCBNP CBG: No results for input(s): GLUCAP in the last 168 hours. D-Dimer No results for input(s): DDIMER in the last 72 hours. Hgb A1c No results for input(s): HGBA1C in the last 72 hours. Lipid Profile No results for input(s): CHOL, HDL, LDLCALC, TRIG, CHOLHDL, LDLDIRECT in the last 72 hours. Thyroid function studies No results for input(s): TSH, T4TOTAL, T3FREE, THYROIDAB in the last 72 hours.  Invalid input(s): FREET3 Anemia work up No results for input(s): VITAMINB12, FOLATE, FERRITIN, TIBC, IRON, RETICCTPCT in the last 72 hours. Microbiology Recent Results (from the past 240 hour(s))  Culture, blood (x 2)     Status: Abnormal   Collection Time: 07/19/20  5:37 PM   Specimen: BLOOD  Result Value Ref Range Status   Specimen Description BLOOD  Final   Special Requests   Final    RIGHT IJ BOTTLES DRAWN  AEROBIC AND ANAEROBIC Blood Culture results may not be optimal due to an inadequate volume of blood received in culture bottles   Culture  Setup Time   Final    GRAM POSITIVE COCCI IN CLUSTERS IN BOTH AEROBIC AND ANAEROBIC BOTTLES Organism ID to follow CRITICAL RESULT CALLED TO, READ BACK BY AND VERIFIED WITH: H VON [facility] PHARMD 2056 07/20/20 A BROWNING    Culture (A)  Final    STAPHYLOCOCCUS HOMINIS THE SIGNIFICANCE OF ISOLATING THIS ORGANISM FROM A SINGLE SET OF BLOOD CULTURES WHEN MULTIPLE SETS ARE DRAWN IS UNCERTAIN. PLEASE NOTIFY THE MICROBIOLOGY DEPARTMENT WITHIN ONE WEEK IF SPECIATION AND SENSITIVITIES ARE REQUIRED. Performed at [facility], [address], [place], [place] [address]    Report Status 07/22/2020 FINAL  Final  Blood Culture ID Panel (Reflexed)     Status: Abnormal   Collection Time: 07/19/20  5:37 PM  Result Value Ref Range Status   Enterococcus faecalis NOT DETECTED NOT DETECTED Final   Enterococcus Faecium NOT DETECTED NOT DETECTED Final   Listeria monocytogenes NOT DETECTED NOT DETECTED Final   Staphylococcus species DETECTED (A) NOT DETECTED Final    Comment: CRITICAL RESULT CALLED TO, READ BACK BY AND VERIFIED WITHCandiss Norse [facility] PHARMD 2056 07/20/20 A BROWNING    Staphylococcus aureus (BCID) NOT DETECTED NOT DETECTED Final   Staphylococcus epidermidis NOT DETECTED  NOT DETECTED Final   Staphylococcus lugdunensis NOT DETECTED NOT DETECTED Final   Streptococcus species NOT DETECTED NOT DETECTED Final   Streptococcus agalactiae NOT DETECTED NOT DETECTED Final   Streptococcus pneumoniae NOT DETECTED NOT DETECTED Final   Streptococcus pyogenes NOT DETECTED NOT DETECTED Final   A.calcoaceticus-baumannii NOT DETECTED NOT DETECTED Final   Bacteroides fragilis NOT DETECTED NOT DETECTED Final   Enterobacterales NOT DETECTED NOT DETECTED Final   Enterobacter cloacae complex NOT DETECTED NOT DETECTED Final   Escherichia coli NOT DETECTED NOT DETECTED Final    Klebsiella aerogenes NOT DETECTED NOT DETECTED Final   Klebsiella oxytoca NOT DETECTED NOT DETECTED Final   Klebsiella pneumoniae NOT DETECTED NOT DETECTED Final   Proteus species NOT DETECTED NOT DETECTED Final   Salmonella species NOT DETECTED NOT DETECTED Final   Serratia marcescens NOT DETECTED NOT DETECTED Final   Haemophilus influenzae NOT DETECTED NOT DETECTED Final   Neisseria meningitidis NOT DETECTED NOT DETECTED Final   Pseudomonas aeruginosa NOT DETECTED NOT DETECTED Final   Stenotrophomonas maltophilia NOT DETECTED NOT DETECTED Final   Candida albicans NOT DETECTED NOT DETECTED Final   Candida auris NOT DETECTED NOT DETECTED Final   Candida glabrata NOT DETECTED NOT DETECTED Final   Candida krusei NOT DETECTED NOT DETECTED Final   Candida parapsilosis NOT DETECTED NOT DETECTED Final   Candida tropicalis NOT DETECTED NOT DETECTED Final   Cryptococcus neoformans/gattii NOT DETECTED NOT DETECTED Final    Comment: Performed at [facility], [address], [place], [place] [address]  MRSA PCR Screening     Status: None   Collection Time: 07/19/20  9:43 PM   Specimen: Nasal Mucosa; Nasopharyngeal  Result Value Ref Range Status   MRSA by PCR NEGATIVE NEGATIVE Final    Comment:        The GeneXpert MRSA Assay (FDA approved for NASAL specimens only), is one component of a comprehensive MRSA colonization surveillance program. It is not intended to diagnose MRSA infection nor to guide or monitor treatment for MRSA infections. Performed at [facility], [address] [place], [place] [address]   Culture, blood (x 2)     Status: None   Collection Time: 07/19/20  9:48 PM   Specimen: BLOOD  Result Value Ref Range Status   Specimen Description BLOOD RIGHT ANTECUBITAL  Final   Special Requests   Final    BOTTLES DRAWN AEROBIC AND ANAEROBIC Blood Culture results may not be optimal due to an inadequate volume of blood received in culture bottles   Culture   Final     NO GROWTH 5 DAYS Performed at [facility], [address], [place], [place] [address]    Report Status 07/24/2020 FINAL  Final  Culture, Urine     Status: Abnormal   Collection Time: 07/20/20 10:51 PM   Specimen: Urine, Random  Result Value Ref Range Status   Specimen Description URINE, RANDOM  Final   Special Requests   Final    NONE Performed at [facility], [address] [place], [place] [address]    Culture >=100,000 COLONIES/mL ENTEROCOCCUS FAECALIS (A)  Final   Report Status 07/23/2020 FINAL  Final   Organism ID, Bacteria ENTEROCOCCUS FAECALIS (A)  Final      Susceptibility   Enterococcus faecalis - MIC*    AMPICILLIN <=2 SENSITIVE Sensitive     NITROFURANTOIN <=16 SENSITIVE Sensitive     VANCOMYCIN 1 SENSITIVE Sensitive     * >=100,000 COLONIES/mL  ENTEROCOCCUS FAECALIS     Discharge Instructions:   Discharge Instructions     Call MD for:   Complete by: As directed    Call MD for:  severe uncontrolled pain   Complete by: As directed    Call MD for:  temperature >100.4   Complete by: As directed    Diet - low sodium heart healthy   Complete by: As directed    Soft diet.   Discharge instructions   Complete by: As directed    Follow-up with your primary care provider at the skilled nursing facility in 3 to 5 days.  Check blood work at that time.  Check PT/INR for Coumadin dosing.   Discharge wound care:   Complete by: As directed    Wound care for pressure ulceration.   Increase activity slowly   Complete by: As directed       Allergies as of 07/26/2020   No Known Allergies      Medication List     STOP taking these medications    candesartan 16 MG tablet Commonly known as: ATACAND   doxycycline 100 MG capsule Commonly known as: VIBRAMYCIN   simvastatin 20 MG tablet Commonly known as: ZOCOR       TAKE these medications    (feeding supplement) PROSource Plus liquid Take 30 mLs by mouth 2 (two) times daily between meals.    feeding supplement Liqd Take 237 mLs by mouth 3 (three) times daily between meals.   acetaminophen 325 MG tablet Commonly known as: TYLENOL Take 650 mg by mouth every 6 (six) hours as needed for mild pain or headache.   alum & mag hydroxide-simeth 200-200-20 MG/5ML suspension Commonly known as: MAALOX/MYLANTA Take 30 mLs by mouth every 6 (six) hours as needed (gerd).   carvedilol 3.125 MG tablet Commonly known as: COREG TAKE 1 TABLET(3.125 MG) BY MOUTH TWICE DAILY WITH A MEAL What changed: See the new instructions.   cholecalciferol 1000 units tablet Commonly known as: VITAMIN D Take 1,000 Units by mouth daily.   diclofenac Sodium 1 % Gel Commonly known as: VOLTAREN Apply 2 g topically as needed (pain). Apply thin layer to knees/feet bilaterally PRN BID   guaiFENesin 100 MG/5ML liquid Commonly known as: ROBITUSSIN Take 200 mg by mouth every 4 (four) hours as needed for cough.   levocetirizine 5 MG tablet Commonly known as: XYZAL TAKE 1 TABLET BY MOUTH EVERY EVENING   losartan 100 MG tablet Commonly known as: COZAAR Take 100 mg by mouth daily.   multivitamin with minerals Tabs tablet Take 1 tablet by mouth daily.   pantoprazole 40 MG tablet Commonly known as: PROTONIX TAKE 1 TABLET(40 MG) BY MOUTH TWICE DAILY   rosuvastatin 5 MG tablet Commonly known as: CRESTOR Take 5 mg by mouth at bedtime.   sucralfate 1 g tablet Commonly known as: CARAFATE Take 1 g by mouth 4 (four) times daily -  with meals and at bedtime. 0630, 1130, 1630, and 2100   tamsulosin 0.4 MG Caps capsule Commonly known as: FLOMAX Take 1 capsule (0.4 mg total) by mouth daily.   thiamine 100 MG tablet Take 1 tablet (100 mg total) by mouth daily.   torsemide 20 MG tablet Commonly known as: DEMADEX Take 1 tablet (20 mg total) by mouth 2 (two) times daily. What changed: additional instructions   warfarin 2.5 MG tablet Commonly known as: COUMADIN Take 2.5 mg by mouth every evening.    warfarin 5 MG tablet Commonly known as: COUMADIN TAKE  1/2 TO 1 TABLET BY MOUTH EVERY DAY AS DIRECTED BY CLINIC               Discharge Care Instructions  (From admission, onward)           Start     Ordered   07/26/20 0000  Discharge wound care:       Comments: Wound care for pressure ulceration.   07/26/20 1257               Time coordinating discharge: 39 minutes  Signed:  Paislynn Hegstrom  [facility] 07/26/2020, 12:58 PM

## 2020-07-26 NOTE — Progress Notes (Signed)
Called [facility] to give report  Talked to [place].

## 2020-07-26 NOTE — Care Management Important Message (Signed)
Important Message  Patient Details  Name: FENDER HERDER MRN: [id number] Date of Birth: 08/17/1940   Medicare Important Message Given:  Yes     Vishruth Seoane P Fallon Howerter 07/26/2020, 2:50 PM

## 2020-07-26 NOTE — Progress Notes (Signed)
ANTICOAGULATION CONSULT NOTE   Pharmacy Consult for Warfarin Indication: atrial fibrillation  No Known Allergies  Patient Measurements:    Vital Signs: Temp: 98.3 F (36.8 C) (03/08 1042) BP: 100/74 (03/08 1042) Pulse Rate: 98 (03/08 1042)  Labs: Recent Labs    07/24/20 0356 07/25/20 0315 07/26/20 0653  HGB 7.8* 7.6* 8.1*  HCT 24.2* 23.6* 25.3*  PLT 308 290 300  LABPROT 17.9* 17.7* 16.7*  INR 1.5* 1.5* 1.4*  CREATININE 0.91 0.87 0.89    CrCl cannot be calculated (Unknown ideal weight.).   Assessment: 71 YOM who presented on 3/1 with hypoxia and SOB. The patient was on warfarin PTA for hx Afib - held d/t concern for GIB w/ FOB+.  Originally, pharmacy was consulted to restart warfarin after clearance from GI. Warfarin has been on hold for decreased hemoglobin, but now patient is cleared for restarting warfarin.  Hemoglobin is now 8.1.   Admit INR 2.3 on PTA dose of 2.5 mg/day - given Vit K 1 mg x 1 on 3/2. INR today is SUBtherapeutic at 1.4.     L upper extremity bruising- [place] neg, possibly from receiving AC.    Goal of Therapy:  INR 2-3 Monitor platelets by anticoagulation protocol: Yes   Plan:  -Warfarin 2.5mg  PO x 1 today -Daily PT/INR -Daily CBC    Hong Timm A. Jeanella Craze, PharmD, BCPS, FNKF Clinical Pharmacist [place] Please utilize Amion for appropriate phone number to reach the unit pharmacist [facility])   07/26/2020 12:09 PM

## 2020-07-26 NOTE — TOC Progression Note (Signed)
Transition of Care [facility]) - Progression Note    Patient Details  Name: Ruben Walker MRN: [id number] Date of Birth: Aug 12, 1940  Transition of Care [facility]) CM/SW Contact  Okey Dupre Lazaro Arms, LCSW Phone Number: 07/26/2020, 3:18 PM  Clinical Narrative:  Talked with patient's sister Suzzanne Cloud [phone]) regarding discharge disposition, and patient not wanting to return to [facility]. Sister expressed agreement, was informed that patient ready for discharge today and was provided with the 3 SNF bed offers. Ms. Ladona Ridgel was also given web site information for Medicare.gov to get additional infor on the facilities. Visited with patient and informed him that his sister Windell Moulding was contacted and he was fine with this. Patient did report that he does not want to return to [place] and expressed that he did not receive the proper care there. Mr. Perazzo was provided with the 3 facilities that indicated they could take him and informed that his sister would be contacting me with her decision after researching facilities and CSW would return to talk with him.  Talked again with sister (11:42) and she reported that she was able to talk by phone with her brother after speaking with CSW. The SNF decision is [facility]. Call made to [facility], admissions director at [facility] regarding patient and they can take pending COVID test results. She asked, and was advise (after talking with patient) that he has not been vaccinated, nor has he has the COVID booster.   Patient will discharge to [facility] today, pending COVID results.        Expected Discharge Plan: Skilled Nursing Facility Barriers to Discharge: Continued Medical Work up  Expected Discharge Plan and Services Expected Discharge Plan: Skilled Nursing Facility In-house Referral: Clinical Social Work     Living arrangements for the past 2 months: Skilled Nursing Facility Expected Discharge Date: 07/26/20                                      Social Determinants of Health (SDOH) Interventions    Readmission Risk Interventions No flowsheet data found.

## 2020-07-26 NOTE — Progress Notes (Signed)
PROGRESS NOTE  Ruben Walker:[id number] DOB: 28-Jul-1940 DOA: 07/19/2020 PCP: Ruben Massed, MD   LOS: 7 days   Brief narrative: Ruben Walker is a 80 yo male with history of hypertension, hyperlipidemia, chronic diastolic heart failure, atrial fibrillation on Coumadin, history of cardiac arrest status post pacemaker placement, chronic respiratory failure on 2 L of oxygen, CKD 3, and OSA on CPAP, was admitted to hospital for hypoxia and hypotension concerning for sepsis on 07/19/2020.  In the ED patient was afebrile with hypotension on 2 L of oxygen by nasal cannula.  He did have mild leukocytosis and hemoglobin was low at 7.6 creatinine was elevated at 1.8 with albumin of 1.8.  Chest x-ray was suggestive of cardiac enlargement with bronchiectasis.  UA was positive for small leukocytes and multiple white cells.  Patient was given 500 mL of IV fluid bolus and was admitted to hospital for sepsis thought to be secondary to UTI and sacral wound.  He did have mild acute kidney injury.  Patient does have chronic indwelling Foley catheter.    During hospitalization, initially received vancomycin and Rocephin.  Patient also received 1 unit of packed RBC due to low hemoglobin and GI was consulted.  He was noted to have a large area of bruising on the left upper extremity but ultrasound was negative for DVT.  General surgery was consulted for his  sacral decubitus wound.  Assessment/Plan:  Principal Problem:   Sepsis (HCC) Active Problems:   Permanent atrial fibrillation (HCC)   Chronic diastolic heart failure (HCC)   Decubitus skin ulcer   Acute renal failure superimposed on stage 3b chronic kidney disease (HCC)   Severe protein-calorie malnutrition Ruben Walker: less than 60% of standard weight) (HCC)   Hyponatremia   History of COVID-19   Acute on chronic respiratory failure with hypoxia (HCC)   Complicated urinary tract infection   Cellulitis of sacral region   Sepsis2/2to infected sacral  decubitus skin ulcer with cellulitis, Complicated enterococcal UTI Urine culture with Enterococcus fecalis and blood culture with staph hominis-likely contamination.  He received vancomycin and Rocephin which he has completed.  Previous provider had spoken with ID.  At this time patient is afebrile and leukocytosis has resolved.  Sacral decubitus ulcer stage IV, admission. -Patient was seen by general surgery.  Dressing was advised with no need for debridement.  Continue dressing  Permanent atrial fibrillation -Coumadin was on hold due to lower hemoglobin.  Has trended up to 8.1 today.  Plan was to initiate Coumadin starting today and will continue to monitor closely.  Will ask pharmacy to dose.  chronic hypoxic respiratory failure Chest x-ray with bronchiectasis.  Continue supplemental oxygen bronchodilators.    Normocytic Anemia Likely multifactorial from acute blood loss anemia, left upper extremity hematoma, will resume Coumadin at this time since hemoglobin has remained stable.  Left upper extremity negative for DVT.    Left upper extremity hematoma. - etiology likely anticoagulation with some possible trauma (slept on left side). Negative DVT on duplex 3/2.chest hemoglobin is stable.  Will resume Coumadin.  AKI on CKD stage IIIb -Improved.  Latest creatinine of 0.8.    Chronic diastolic CHF Patient does have erythema but no acute dyspnea.  Hyponatremia  Initially thought to be hypovolemic.  Off IV fluids at this time.  Sodium of 134.  History of cardiac arrest post AICD/pacemaker.  Continue supportive care.  History of COVID infection 06/14/2020.  Continue on supplemental oxygen.  Generalized weakness/debility/deconditioning    On chronic Foley catheter.  History of frequent falls.  Basically bedbound.  Continue PT OT, recommendation is skilled nursing facility on discharge.  OSA-continue CPAP/ bipap at skilled nursing facility.  Severe protein calorie  malnutrition - related to chronic illnessas evidenced by severe muscle depletion,severe fat depletion - continue diet and supplements    DVT prophylaxis: SCDs Start: 07/19/20 1557    Code Status: DNR  Family Communication:  None  Status is: Inpatient  Remains inpatient appropriate because:Awaiting for skilled nursing facility placement   Dispo: The patient is from: SNF              Anticipated d/c is to: SNF              Patient currently is medically stable to d/c.   Anticipated date of discharge: whenever bed is available   Difficult to place patient No  Consultants:  GI   Procedures:  None  Anti-infectives:  . None at this time  Anti-infectives (From admission, onward)   Start     Dose/Rate Route Frequency Ordered Stop   07/23/20 1100  vancomycin (VANCOREADY) IVPB 1000 mg/200 mL  Status:  Discontinued        1,000 mg 200 mL/hr over 60 Minutes Intravenous Every 24 hours 07/23/20 0725 07/24/20 0750   07/21/20 1800  vancomycin (VANCOREADY) IVPB 1250 mg/250 mL  Status:  Discontinued        1,250 mg 166.7 mL/hr over 90 Minutes Intravenous Every 48 hours 07/20/20 0901 07/21/20 0944   07/21/20 1100  vancomycin (VANCOREADY) IVPB 750 mg/150 mL  Status:  Discontinued        750 mg 150 mL/hr over 60 Minutes Intravenous Every 24 hours 07/21/20 0944 07/23/20 0725   07/19/20 2110  vancomycin variable dose per unstable renal function (pharmacist dosing)  Status:  Discontinued         Does not apply See admin instructions 07/19/20 2110 07/21/20 0944   07/19/20 1730  vancomycin (VANCOREADY) IVPB 1750 mg/350 mL        1,750 mg 175 mL/hr over 120 Minutes Intravenous  Once 07/19/20 1631 07/19/20 2111   07/19/20 1645  cefTRIAXone (ROCEPHIN) 1 g in sodium chloride 0.9 % 100 mL IVPB  Status:  Discontinued        1 g 200 mL/hr over 30 Minutes Intravenous Every 24 hours 07/19/20 1631 07/23/20 1054       Subjective: Today, patient was seen and examined at bedside.  Patient  denies any nausea vomiting has not had a bowel movement or bleeding.  Denies any shortness of breath cough or chest pain.  Objective: Vitals:   07/26/20 0513 07/26/20 1042  BP: (!) 144/53 100/74  Pulse: 71 98  Resp: 16 19  Temp: 98.5 F (36.9 C) 98.3 F (36.8 C)  SpO2: 98% 96%    Intake/Output Summary (Last 24 hours) at 07/26/2020 1150 Last data filed at 07/26/2020 0800 Gross per 24 hour  Intake 777 ml  Output 500 ml  Net 277 ml   There were no vitals filed for this visit. There is no height or weight on file to calculate BMI.   Physical Exam: GENERAL: Patient is alert awake and oriented. Not in obvious distress.,  Chronically ill appearing HENT: No scleral pallor or icterus. Pupils equally reactive to light. Oral mucosa is moist NECK: is supple, no gross swelling noted. CHEST: Clear to auscultation. No crackles or wheezes.  Diminished breath sounds bilaterally. CVS: S1 and S2 heard, no murmur.  Irregular irregular rhythm, ABDOMEN: Soft, non-tender,  bowel sounds are present.  Chronic indwelling Foley catheter in place EXTREMITIES: Lower extremity edema noted, left upper extremity edema, midline in the right upper extremity CNS: Cranial nerves are intact.  Moves extremities. SKIN: warm and dry, pressure ulceration stage II on both buttocks present on admission, deep tissue injury left heel, unstageable ulceration over the sacrum.-Present on admission.  Data Review: I have personally reviewed the following laboratory data and studies,  CBC: Recent Labs  Lab 07/19/20 1254 07/20/20 0729 07/22/20 1650 07/23/20 0412 07/24/20 0356 07/25/20 0315 07/26/20 0653  WBC 16.0*   < > 12.4* 10.0 9.5 8.6 10.8*  NEUTROABS 13.5*  --   --   --   --  5.2 6.7  HGB 7.6*   < > 8.2* 7.4* 7.8* 7.6* 8.1*  HCT 24.6*   < > 26.5* 23.8* 24.2* 23.6* 25.3*  MCV 84.5   < > 85.2 84.7 84.3 84.3 85.8  PLT 436*   < > 336 329 308 290 300   < > = values in this interval not displayed.   Basic Metabolic  Panel: Recent Labs  Lab 07/22/20 0441 07/23/20 0412 07/24/20 0356 07/25/20 0315 07/26/20 0653  NA 132* 132* 132* 132* 134*  K 4.1 4.0 4.2 4.2 4.4  CL 100 106 105 107 109  CO2 21* 19* 19* 17* 19*  GLUCOSE 71 103* 86 93 88  BUN 49* 38* 33* 29* 27*  CREATININE 1.16 1.01 0.91 0.87 0.89  CALCIUM 9.7 9.5 9.5 9.7 10.0  MG  --   --   --  1.6* 1.7   Liver Function Tests: Recent Labs  Lab 07/19/20 1254  AST 27  ALT 21  ALKPHOS 96  BILITOT 0.9  PROT 6.3*  ALBUMIN 1.8*   No results for input(s): LIPASE, AMYLASE in the last 168 hours. No results for input(s): AMMONIA in the last 168 hours. Cardiac Enzymes: No results for input(s): CKTOTAL, CKMB, CKMBINDEX, TROPONINI in the last 168 hours. BNP (last 3 results) Recent Labs    07/19/20 1212  BNP 271.9*    ProBNP (last 3 results) No results for input(s): PROBNP in the last 8760 hours.  CBG: No results for input(s): GLUCAP in the last 168 hours. Recent Results (from the past 240 hour(s))  Culture, blood (x 2)     Status: Abnormal   Collection Time: 07/19/20  5:37 PM   Specimen: BLOOD  Result Value Ref Range Status   Specimen Description BLOOD  Final   Special Requests   Final    RIGHT IJ BOTTLES DRAWN AEROBIC AND ANAEROBIC Blood Culture results may not be optimal due to an inadequate volume of blood received in culture bottles   Culture  Setup Time   Final    GRAM POSITIVE COCCI IN CLUSTERS IN BOTH AEROBIC AND ANAEROBIC BOTTLES Organism ID to follow CRITICAL RESULT CALLED TO, READ BACK BY AND VERIFIED WITH: [facility] 2056 07/20/20 A BROWNING    Culture (A)  Final    STAPHYLOCOCCUS HOMINIS THE SIGNIFICANCE OF ISOLATING THIS ORGANISM FROM A SINGLE SET OF BLOOD CULTURES WHEN MULTIPLE SETS ARE DRAWN IS UNCERTAIN. PLEASE NOTIFY THE MICROBIOLOGY DEPARTMENT WITHIN ONE WEEK IF SPECIATION AND SENSITIVITIES ARE REQUIRED. Performed at [facility], [address] [place], [place] [address]    Report Status 07/22/2020  FINAL  Final  Blood Culture ID Panel (Reflexed)     Status: Abnormal   Collection Time: 07/19/20  5:37 PM  Result Value Ref Range Status  Enterococcus faecalis NOT DETECTED NOT DETECTED Final   Enterococcus Faecium NOT DETECTED NOT DETECTED Final   Listeria monocytogenes NOT DETECTED NOT DETECTED Final   Staphylococcus species DETECTED (A) NOT DETECTED Final    Comment: CRITICAL RESULT CALLED TO, READ BACK BY AND VERIFIED WITH: H VON [facility] PHARMD 2056 07/20/20 A BROWNING    Staphylococcus aureus (BCID) NOT DETECTED NOT DETECTED Final   Staphylococcus epidermidis NOT DETECTED NOT DETECTED Final   Staphylococcus lugdunensis NOT DETECTED NOT DETECTED Final   Streptococcus species NOT DETECTED NOT DETECTED Final   Streptococcus agalactiae NOT DETECTED NOT DETECTED Final   Streptococcus pneumoniae NOT DETECTED NOT DETECTED Final   Streptococcus pyogenes NOT DETECTED NOT DETECTED Final   A.calcoaceticus-baumannii NOT DETECTED NOT DETECTED Final   Bacteroides fragilis NOT DETECTED NOT DETECTED Final   Enterobacterales NOT DETECTED NOT DETECTED Final   Enterobacter cloacae complex NOT DETECTED NOT DETECTED Final   Escherichia coli NOT DETECTED NOT DETECTED Final   Klebsiella aerogenes NOT DETECTED NOT DETECTED Final   Klebsiella oxytoca NOT DETECTED NOT DETECTED Final   Klebsiella pneumoniae NOT DETECTED NOT DETECTED Final   Proteus species NOT DETECTED NOT DETECTED Final   Salmonella species NOT DETECTED NOT DETECTED Final   Serratia marcescens NOT DETECTED NOT DETECTED Final   Haemophilus influenzae NOT DETECTED NOT DETECTED Final   Neisseria meningitidis NOT DETECTED NOT DETECTED Final   Pseudomonas aeruginosa NOT DETECTED NOT DETECTED Final   Stenotrophomonas maltophilia NOT DETECTED NOT DETECTED Final   Candida albicans NOT DETECTED NOT DETECTED Final   Candida auris NOT DETECTED NOT DETECTED Final   Candida glabrata NOT DETECTED NOT DETECTED Final   Candida krusei NOT DETECTED NOT  DETECTED Final   Candida parapsilosis NOT DETECTED NOT DETECTED Final   Candida tropicalis NOT DETECTED NOT DETECTED Final   Cryptococcus neoformans/gattii NOT DETECTED NOT DETECTED Final    Comment: Performed at [facility], [address], [place], [place] [address]  MRSA PCR Screening     Status: None   Collection Time: 07/19/20  9:43 PM   Specimen: Nasal Mucosa; Nasopharyngeal  Result Value Ref Range Status   MRSA by PCR NEGATIVE NEGATIVE Final    Comment:        The GeneXpert MRSA Assay (FDA approved for NASAL specimens only), is one component of a comprehensive MRSA colonization surveillance program. It is not intended to diagnose MRSA infection nor to guide or monitor treatment for MRSA infections. Performed at [facility], [address], [place], [place] [address]   Culture, blood (x 2)     Status: None   Collection Time: 07/19/20  9:48 PM   Specimen: BLOOD  Result Value Ref Range Status   Specimen Description BLOOD RIGHT ANTECUBITAL  Final   Special Requests   Final    BOTTLES DRAWN AEROBIC AND ANAEROBIC Blood Culture results may not be optimal due to an inadequate volume of blood received in culture bottles   Culture   Final    NO GROWTH 5 DAYS Performed at [facility], [address], [place], [place] [address]    Report Status 07/24/2020 FINAL  Final  Culture, Urine     Status: Abnormal   Collection Time: 07/20/20 10:51 PM   Specimen: Urine, Random  Result Value Ref Range Status   Specimen Description URINE, RANDOM  Final   Special Requests   Final    NONE Performed at [facility], [address] [place], [place]  [address]    Culture >=100,000 COLONIES/mL ENTEROCOCCUS FAECALIS (A)  Final   Report Status 07/23/2020 FINAL  Final   Organism ID, Bacteria ENTEROCOCCUS FAECALIS (A)  Final      Susceptibility   Enterococcus faecalis - MIC*    AMPICILLIN <=2 SENSITIVE Sensitive     NITROFURANTOIN <=16 SENSITIVE Sensitive      VANCOMYCIN 1 SENSITIVE Sensitive     * >=100,000 COLONIES/mL ENTEROCOCCUS FAECALIS     Studies: No results found.    Joycelyn Das, MD  [facility] 07/26/2020  If 7PM-7AM, please contact night-coverage

## 2020-07-26 NOTE — Plan of Care (Signed)
  Problem: Health Behavior/Discharge Planning: Goal: Ability to manage health-related needs will improve Outcome: Adequate for Discharge   Problem: Clinical Measurements: Goal: Ability to maintain clinical measurements within normal limits will improve Outcome: Adequate for Discharge   Problem: Clinical Measurements: Goal: Will remain free from infection Outcome: Adequate for Discharge   Problem: Activity: Goal: Risk for activity intolerance will decrease Outcome: Adequate for Discharge   Problem: Nutrition: Goal: Adequate nutrition will be maintained Outcome: Adequate for Discharge   Problem: Coping: Goal: Level of anxiety will decrease Outcome: Adequate for Discharge   Problem: Elimination: Goal: Will not experience complications related to bowel motility Outcome: Adequate for Discharge   Problem: Elimination: Goal: Will not experience complications related to urinary retention Outcome: Adequate for Discharge   Problem: Pain Managment: Goal: General experience of comfort will improve Outcome: Adequate for Discharge   Problem: Safety: Goal: Ability to remain free from injury will improve Outcome: Adequate for Discharge   Problem: Skin Integrity: Goal: Risk for impaired skin integrity will decrease Outcome: Adequate for Discharge

## 2020-07-26 NOTE — Plan of Care (Signed)
  Problem: Pain Managment: Goal: General experience of comfort will improve Outcome: Progressing   Problem: Nutrition: Goal: Adequate nutrition will be maintained Outcome: Not Progressing   

## 2020-07-26 NOTE — TOC Transition Note (Addendum)
Transition of Care [facility]) - CM/SW Discharge Note *Discharged to [facility] *Room 102   Patient Details  Name: Ruben Walker MRN: [id number] Date of Birth: August 25, 1940  Transition of Care [facility]) CM/SW Contact:  Cristobal Goldmann, LCSW Phone Number: 07/26/2020, 4:06 PM   Clinical Narrative: Patient medically stable for discharge and accepted a bed at [facility] to continue ST rehab. Patient and sister (VM left for sister) informed that non-emergency ambulance transport arranged. Patient's COVID results are negative. Patient is a DNR and signed form in his shadow chart.    Final next level of care: Skilled Nursing Facility [facility]) Barriers to Discharge: Continued Medical Work up   Patient Goals and CMS Choice Patient states their goals for this hospitalization and ongoing recovery are:: Patient agreeable to ST rehab in a different facility CMS Medicare.gov Compare Post Acute Care list provided to:: Patient Represenative (must comment) (Patient's sister, Suzzanne Cloud provided with Medicare.gov information) Choice offered to / list presented to : Sibling  Discharge Placement   Existing PASRR number confirmed : 07/24/20          Patient chooses bed at: [facility] Patient to be transferred to facility by: Non-emergency ambulance transport Name of family member notified: Sister - Suzzanne Cloud - [phone] Patient and family notified of of transfer: 07/26/20  Discharge Plan and Services In-house Referral: Clinical Social Work                                  Social Determinants of Health (SDOH) Interventions  No SDOH interventions requested or needed at discharge.   Readmission Risk Interventions No flowsheet data found.

## 2020-07-28 ENCOUNTER — Other Ambulatory Visit: Payer: Self-pay | Admitting: *Deleted

## 2020-07-28 NOTE — Patient Outreach (Signed)
Member screened for potential [facility] Care Management needs.  Communication sent to [facility] SW to inquire about transition plans and potential [facility] needs.   Will continue to follow while member resides in [facility].   Keimani Laufer, MSN, RN,BSN [facility] Post Acute Care Coordinator [phone] ( Business Mobile) [phone]  (Toll free office)  

## 2020-08-02 DIAGNOSIS — R159 Full incontinence of feces: Secondary | ICD-10-CM | POA: Diagnosis not present

## 2020-08-02 DIAGNOSIS — L89312 Pressure ulcer of right buttock, stage 2: Secondary | ICD-10-CM | POA: Diagnosis not present

## 2020-08-02 DIAGNOSIS — L89150 Pressure ulcer of sacral region, unstageable: Secondary | ICD-10-CM | POA: Diagnosis not present

## 2020-08-02 DIAGNOSIS — L89313 Pressure ulcer of right buttock, stage 3: Secondary | ICD-10-CM | POA: Diagnosis not present

## 2020-08-04 ENCOUNTER — Ambulatory Visit: Payer: Medicare Other | Admitting: Cardiovascular Disease
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
## 2020-08-05 ENCOUNTER — Other Ambulatory Visit: Payer: Self-pay | Admitting: *Deleted

## 2020-08-05 DIAGNOSIS — Z9981 Dependence on supplemental oxygen: Secondary | ICD-10-CM | POA: Diagnosis not present

## 2020-08-05 DIAGNOSIS — R5381 Other malaise: Secondary | ICD-10-CM | POA: Diagnosis not present

## 2020-08-05 DIAGNOSIS — L89154 Pressure ulcer of sacral region, stage 4: Secondary | ICD-10-CM | POA: Diagnosis not present

## 2020-08-05 DIAGNOSIS — R6883 Chills (without fever): Secondary | ICD-10-CM | POA: Diagnosis not present

## 2020-08-05 DIAGNOSIS — J9611 Chronic respiratory failure with hypoxia: Secondary | ICD-10-CM | POA: Diagnosis not present

## 2020-08-05 DIAGNOSIS — R5383 Other fatigue: Secondary | ICD-10-CM | POA: Diagnosis not present

## 2020-08-05 DIAGNOSIS — R509 Fever, unspecified: Secondary | ICD-10-CM | POA: Diagnosis not present

## 2020-08-05 NOTE — Patient Outreach (Signed)
[facility] Post- Acute Care Coordinator follow up.   Update received from [facility] SW indicating member's transition plan is to transfer to [facility] sister facility [address] [facility] in [place], [place]. His sister is in agreement. Mr.  Giangregorio lived alone prior. Primary contact/support is his sister Jenel Lucks.  No identifiable [facility] Care Management needs at this time.    Raiford Noble, MSN, RN,BSN [facility] Post Acute Care Coordinator [phone] [facility]) [phone]  (Toll free office)

## 2020-08-05 NOTE — Patient Outreach (Signed)
Member screened for potential [facility] needs.  Communication sent again and voicemail left for [facility] SW to inquire about transition plans.  Will continue to follow for identifiable [facility] Care Management needs. Will plan outreach accordingly.   Raiford Noble, MSN, RN,BSN [facility] Post Acute Care Coordinator [phone] [facility]) [phone]  (Toll free office)

## 2020-08-08 DIAGNOSIS — L03319 Cellulitis of trunk, unspecified: Secondary | ICD-10-CM | POA: Diagnosis not present

## 2020-08-08 DIAGNOSIS — A419 Sepsis, unspecified organism: Secondary | ICD-10-CM | POA: Diagnosis not present

## 2020-08-08 DIAGNOSIS — M6281 Muscle weakness (generalized): Secondary | ICD-10-CM | POA: Diagnosis not present

## 2020-08-10 DIAGNOSIS — R52 Pain, unspecified: Secondary | ICD-10-CM | POA: Diagnosis not present

## 2020-08-10 DIAGNOSIS — R5381 Other malaise: Secondary | ICD-10-CM | POA: Diagnosis not present

## 2020-08-10 DIAGNOSIS — R627 Adult failure to thrive: Secondary | ICD-10-CM | POA: Diagnosis not present

## 2020-08-10 DIAGNOSIS — L89154 Pressure ulcer of sacral region, stage 4: Secondary | ICD-10-CM | POA: Diagnosis not present

## 2020-08-11 DIAGNOSIS — L89154 Pressure ulcer of sacral region, stage 4: Secondary | ICD-10-CM | POA: Diagnosis not present

## 2020-08-11 DIAGNOSIS — Z7901 Long term (current) use of anticoagulants: Secondary | ICD-10-CM | POA: Diagnosis not present

## 2020-08-11 DIAGNOSIS — R52 Pain, unspecified: Secondary | ICD-10-CM | POA: Diagnosis not present

## 2020-08-11 DIAGNOSIS — E860 Dehydration: Secondary | ICD-10-CM | POA: Diagnosis not present

## 2020-08-11 DIAGNOSIS — E871 Hypo-osmolality and hyponatremia: Secondary | ICD-10-CM | POA: Diagnosis not present

## 2020-08-11 DIAGNOSIS — R0602 Shortness of breath: Secondary | ICD-10-CM | POA: Diagnosis not present

## 2020-08-11 DIAGNOSIS — R5381 Other malaise: Secondary | ICD-10-CM | POA: Diagnosis not present

## 2020-08-11 DIAGNOSIS — R627 Adult failure to thrive: Secondary | ICD-10-CM | POA: Diagnosis not present

## 2020-08-11 DIAGNOSIS — R5383 Other fatigue: Secondary | ICD-10-CM | POA: Diagnosis not present

## 2020-08-11 DIAGNOSIS — I480 Paroxysmal atrial fibrillation: Secondary | ICD-10-CM | POA: Diagnosis not present

## 2020-08-12 DIAGNOSIS — L89610 Pressure ulcer of right heel, unstageable: Secondary | ICD-10-CM | POA: Diagnosis not present

## 2020-08-12 DIAGNOSIS — L89150 Pressure ulcer of sacral region, unstageable: Secondary | ICD-10-CM | POA: Diagnosis not present

## 2020-08-12 DIAGNOSIS — R5381 Other malaise: Secondary | ICD-10-CM | POA: Diagnosis not present

## 2020-08-12 DIAGNOSIS — R627 Adult failure to thrive: Secondary | ICD-10-CM | POA: Diagnosis not present

## 2020-08-12 DIAGNOSIS — S81811A Laceration without foreign body, right lower leg, initial encounter: Secondary | ICD-10-CM | POA: Diagnosis not present

## 2020-08-12 DIAGNOSIS — L89154 Pressure ulcer of sacral region, stage 4: Secondary | ICD-10-CM | POA: Diagnosis not present

## 2020-08-12 DIAGNOSIS — R52 Pain, unspecified: Secondary | ICD-10-CM | POA: Diagnosis not present

## 2020-08-12 DIAGNOSIS — E860 Dehydration: Secondary | ICD-10-CM | POA: Diagnosis not present

## 2020-08-12 DIAGNOSIS — R0602 Shortness of breath: Secondary | ICD-10-CM | POA: Diagnosis not present

## 2020-08-12 DIAGNOSIS — L89626 Pressure-induced deep tissue damage of left heel: Secondary | ICD-10-CM | POA: Diagnosis not present

## 2020-08-15 DIAGNOSIS — R6883 Chills (without fever): Secondary | ICD-10-CM | POA: Diagnosis not present

## 2020-08-15 DIAGNOSIS — E860 Dehydration: Secondary | ICD-10-CM | POA: Diagnosis not present

## 2020-08-15 DIAGNOSIS — R52 Pain, unspecified: Secondary | ICD-10-CM | POA: Diagnosis not present

## 2020-08-15 DIAGNOSIS — Z9981 Dependence on supplemental oxygen: Secondary | ICD-10-CM | POA: Diagnosis not present

## 2020-08-15 DIAGNOSIS — R5381 Other malaise: Secondary | ICD-10-CM | POA: Diagnosis not present

## 2020-08-15 DIAGNOSIS — L89154 Pressure ulcer of sacral region, stage 4: Secondary | ICD-10-CM | POA: Diagnosis not present

## 2020-08-15 DIAGNOSIS — R0602 Shortness of breath: Secondary | ICD-10-CM | POA: Diagnosis not present

## 2020-08-15 DIAGNOSIS — F419 Anxiety disorder, unspecified: Secondary | ICD-10-CM | POA: Diagnosis not present

## 2020-08-15 DIAGNOSIS — R627 Adult failure to thrive: Secondary | ICD-10-CM | POA: Diagnosis not present

## 2020-08-16 ENCOUNTER — Emergency Department (HOSPITAL_COMMUNITY): Payer: Medicare Other

## 2020-08-16 ENCOUNTER — Encounter (HOSPITAL_COMMUNITY): Payer: Self-pay

## 2020-08-16 ENCOUNTER — Inpatient Hospital Stay (HOSPITAL_COMMUNITY)
Admission: EM | Admit: 2020-08-16 | Discharge: 2020-08-22 | DRG: 871 | Disposition: A | Discharge status: Hospice | Payer: Medicare Other | Source: Skilled Nursing Facility | Attending: Internal Medicine | Admitting: Internal Medicine

## 2020-08-16 DIAGNOSIS — N1832 Chronic kidney disease, stage 3b: Secondary | ICD-10-CM | POA: Diagnosis present

## 2020-08-16 DIAGNOSIS — L0390 Cellulitis, unspecified: Secondary | ICD-10-CM | POA: Diagnosis present

## 2020-08-16 DIAGNOSIS — I130 Hypertensive heart and chronic kidney disease with heart failure and stage 1 through stage 4 chronic kidney disease, or unspecified chronic kidney disease: Secondary | ICD-10-CM | POA: Diagnosis present

## 2020-08-16 DIAGNOSIS — R4182 Altered mental status, unspecified: Secondary | ICD-10-CM

## 2020-08-16 DIAGNOSIS — Z87440 Personal history of urinary (tract) infections: Secondary | ICD-10-CM

## 2020-08-16 DIAGNOSIS — I071 Rheumatic tricuspid insufficiency: Secondary | ICD-10-CM | POA: Diagnosis present

## 2020-08-16 DIAGNOSIS — E861 Hypovolemia: Secondary | ICD-10-CM | POA: Diagnosis present

## 2020-08-16 DIAGNOSIS — I872 Venous insufficiency (chronic) (peripheral): Secondary | ICD-10-CM | POA: Diagnosis present

## 2020-08-16 DIAGNOSIS — L89893 Pressure ulcer of other site, stage 3: Secondary | ICD-10-CM | POA: Diagnosis present

## 2020-08-16 DIAGNOSIS — K219 Gastro-esophageal reflux disease without esophagitis: Secondary | ICD-10-CM | POA: Diagnosis present

## 2020-08-16 DIAGNOSIS — Z66 Do not resuscitate: Secondary | ICD-10-CM | POA: Diagnosis present

## 2020-08-16 DIAGNOSIS — Z7901 Long term (current) use of anticoagulants: Secondary | ICD-10-CM

## 2020-08-16 DIAGNOSIS — E871 Hypo-osmolality and hyponatremia: Secondary | ICD-10-CM | POA: Diagnosis present

## 2020-08-16 DIAGNOSIS — Z20822 Contact with and (suspected) exposure to covid-19: Secondary | ICD-10-CM | POA: Diagnosis present

## 2020-08-16 DIAGNOSIS — Z9981 Dependence on supplemental oxygen: Secondary | ICD-10-CM

## 2020-08-16 DIAGNOSIS — M47816 Spondylosis without myelopathy or radiculopathy, lumbar region: Secondary | ICD-10-CM | POA: Diagnosis not present

## 2020-08-16 DIAGNOSIS — L89154 Pressure ulcer of sacral region, stage 4: Secondary | ICD-10-CM

## 2020-08-16 DIAGNOSIS — G4733 Obstructive sleep apnea (adult) (pediatric): Secondary | ICD-10-CM | POA: Diagnosis present

## 2020-08-16 DIAGNOSIS — E875 Hyperkalemia: Secondary | ICD-10-CM | POA: Diagnosis present

## 2020-08-16 DIAGNOSIS — Z95810 Presence of automatic (implantable) cardiac defibrillator: Secondary | ICD-10-CM

## 2020-08-16 DIAGNOSIS — D62 Acute posthemorrhagic anemia: Secondary | ICD-10-CM | POA: Diagnosis present

## 2020-08-16 DIAGNOSIS — E43 Unspecified severe protein-calorie malnutrition: Secondary | ICD-10-CM | POA: Diagnosis present

## 2020-08-16 DIAGNOSIS — R6520 Severe sepsis without septic shock: Secondary | ICD-10-CM | POA: Diagnosis present

## 2020-08-16 DIAGNOSIS — I4821 Permanent atrial fibrillation: Secondary | ICD-10-CM | POA: Diagnosis present

## 2020-08-16 DIAGNOSIS — E785 Hyperlipidemia, unspecified: Secondary | ICD-10-CM | POA: Diagnosis present

## 2020-08-16 DIAGNOSIS — I442 Atrioventricular block, complete: Secondary | ICD-10-CM | POA: Diagnosis present

## 2020-08-16 DIAGNOSIS — Z8249 Family history of ischemic heart disease and other diseases of the circulatory system: Secondary | ICD-10-CM

## 2020-08-16 DIAGNOSIS — I5032 Chronic diastolic (congestive) heart failure: Secondary | ICD-10-CM | POA: Diagnosis present

## 2020-08-16 DIAGNOSIS — R053 Chronic cough: Secondary | ICD-10-CM | POA: Diagnosis present

## 2020-08-16 DIAGNOSIS — A419 Sepsis, unspecified organism: Secondary | ICD-10-CM | POA: Diagnosis not present

## 2020-08-16 DIAGNOSIS — I10 Essential (primary) hypertension: Secondary | ICD-10-CM | POA: Diagnosis present

## 2020-08-16 DIAGNOSIS — Z515 Encounter for palliative care: Secondary | ICD-10-CM

## 2020-08-16 DIAGNOSIS — L03319 Cellulitis of trunk, unspecified: Secondary | ICD-10-CM | POA: Diagnosis present

## 2020-08-16 DIAGNOSIS — L89322 Pressure ulcer of left buttock, stage 2: Secondary | ICD-10-CM | POA: Diagnosis present

## 2020-08-16 DIAGNOSIS — E669 Obesity, unspecified: Secondary | ICD-10-CM | POA: Diagnosis present

## 2020-08-16 DIAGNOSIS — D638 Anemia in other chronic diseases classified elsewhere: Secondary | ICD-10-CM | POA: Diagnosis present

## 2020-08-16 DIAGNOSIS — L89312 Pressure ulcer of right buttock, stage 2: Secondary | ICD-10-CM | POA: Diagnosis present

## 2020-08-16 DIAGNOSIS — L89626 Pressure-induced deep tissue damage of left heel: Secondary | ICD-10-CM | POA: Diagnosis present

## 2020-08-16 DIAGNOSIS — R627 Adult failure to thrive: Secondary | ICD-10-CM

## 2020-08-16 DIAGNOSIS — Z8674 Personal history of sudden cardiac arrest: Secondary | ICD-10-CM

## 2020-08-16 DIAGNOSIS — N179 Acute kidney failure, unspecified: Secondary | ICD-10-CM | POA: Diagnosis not present

## 2020-08-16 DIAGNOSIS — T373X5A Adverse effect of other antiprotozoal drugs, initial encounter: Secondary | ICD-10-CM | POA: Diagnosis not present

## 2020-08-16 DIAGNOSIS — R791 Abnormal coagulation profile: Secondary | ICD-10-CM | POA: Diagnosis present

## 2020-08-16 DIAGNOSIS — J9611 Chronic respiratory failure with hypoxia: Secondary | ICD-10-CM | POA: Diagnosis present

## 2020-08-16 DIAGNOSIS — Z6835 Body mass index (BMI) 35.0-35.9, adult: Secondary | ICD-10-CM

## 2020-08-16 DIAGNOSIS — L89610 Pressure ulcer of right heel, unstageable: Secondary | ICD-10-CM

## 2020-08-16 DIAGNOSIS — G9341 Metabolic encephalopathy: Secondary | ICD-10-CM | POA: Diagnosis present

## 2020-08-16 DIAGNOSIS — Z7189 Other specified counseling: Secondary | ICD-10-CM

## 2020-08-16 DIAGNOSIS — Z79899 Other long term (current) drug therapy: Secondary | ICD-10-CM

## 2020-08-16 LAB — URINALYSIS, ROUTINE W REFLEX MICROSCOPIC
Bilirubin Urine: NEGATIVE
Glucose, UA: NEGATIVE mg/dL
Hgb urine dipstick: NEGATIVE
Ketones, ur: NEGATIVE mg/dL
Leukocytes,Ua: NEGATIVE
Nitrite: NEGATIVE
Protein, ur: NEGATIVE mg/dL
Specific Gravity, Urine: 1.009 (ref 1.005–1.030)
pH: 7 (ref 5.0–8.0)

## 2020-08-16 LAB — COMPREHENSIVE METABOLIC PANEL
ALT: 22 U/L (ref 0–44)
AST: 28 U/L (ref 15–41)
Albumin: 1.4 g/dL — ABNORMAL LOW (ref 3.5–5.0)
Alkaline Phosphatase: 79 U/L (ref 38–126)
Anion gap: 6 (ref 5–15)
BUN: 48 mg/dL — ABNORMAL HIGH (ref 8–23)
CO2: 25 mmol/L (ref 22–32)
Calcium: 9.9 mg/dL (ref 8.9–10.3)
Chloride: 100 mmol/L (ref 98–111)
Creatinine, Ser: 1.76 mg/dL — ABNORMAL HIGH (ref 0.61–1.24)
GFR, Estimated: 39 mL/min — ABNORMAL LOW (ref >60)
Glucose, Bld: 75 mg/dL (ref 70–99)
Potassium: 4.9 mmol/L (ref 3.5–5.1)
Sodium: 131 mmol/L — ABNORMAL LOW (ref 135–145)
Total Bilirubin: 0.6 mg/dL (ref 0.3–1.2)
Total Protein: 5.1 g/dL — ABNORMAL LOW (ref 6.5–8.1)

## 2020-08-16 LAB — CBC WITH DIFFERENTIAL/PLATELET
Abs Immature Granulocytes: 0.53 10*3/uL — ABNORMAL HIGH (ref 0.00–0.07)
Basophils Absolute: 0.1 10*3/uL (ref 0.0–0.1)
Basophils Relative: 0 %
Eosinophils Absolute: 0.5 10*3/uL (ref 0.0–0.5)
Eosinophils Relative: 3 %
HCT: 21.7 % — ABNORMAL LOW (ref 39.0–52.0)
Hemoglobin: 6.7 g/dL — CL (ref 13.0–17.0)
Immature Granulocytes: 3 %
Lymphocytes Relative: 18 %
Lymphs Abs: 3.3 10*3/uL (ref 0.7–4.0)
MCH: 25.6 pg — ABNORMAL LOW (ref 26.0–34.0)
MCHC: 30.9 g/dL (ref 30.0–36.0)
MCV: 82.8 fL (ref 80.0–100.0)
Monocytes Absolute: 1.2 10*3/uL — ABNORMAL HIGH (ref 0.1–1.0)
Monocytes Relative: 7 %
Neutro Abs: 12.4 10*3/uL — ABNORMAL HIGH (ref 1.7–7.7)
Neutrophils Relative %: 69 %
Platelets: 589 10*3/uL — ABNORMAL HIGH (ref 150–400)
RBC: 2.62 MIL/uL — ABNORMAL LOW (ref 4.22–5.81)
RDW: 16.5 % — ABNORMAL HIGH (ref 11.5–15.5)
WBC: 17.9 10*3/uL — ABNORMAL HIGH (ref 4.0–10.5)
nRBC: 0 % (ref 0.0–0.2)

## 2020-08-16 LAB — PREPARE RBC (CROSSMATCH)

## 2020-08-16 LAB — LACTIC ACID, PLASMA: Lactic Acid, Venous: 1.2 mmol/L (ref 0.5–1.9)

## 2020-08-16 LAB — CBG MONITORING, ED: Glucose-Capillary: 68 mg/dL — ABNORMAL LOW (ref 70–99)

## 2020-08-16 LAB — PROTIME-INR
INR: 5.2 (ref 0.8–1.2)
Prothrombin Time: 46.4 seconds — ABNORMAL HIGH (ref 11.4–15.2)

## 2020-08-16 LAB — MAGNESIUM: Magnesium: 1.7 mg/dL (ref 1.7–2.4)

## 2020-08-16 LAB — POC OCCULT BLOOD, ED: Fecal Occult Bld: NEGATIVE

## 2020-08-16 MED ORDER — SODIUM CHLORIDE 0.9 % IV SOLN: 2.0000 g | INTRAVENOUS | Status: DC

## 2020-08-16 MED ORDER — VANCOMYCIN VARIABLE DOSE PER UNSTABLE RENAL FUNCTION (PHARMACIST DOSING): Status: DC

## 2020-08-16 MED ORDER — SODIUM CHLORIDE 0.9 % IV BOLUS
1000.0000 mL | Freq: Once | INTRAVENOUS | Status: AC
  Administered 2020-08-16: 1000 mL via INTRAVENOUS

## 2020-08-16 MED ORDER — PHYTONADIONE 5 MG PO TABS
2.5000 mg | ORAL_TABLET | Freq: Once | ORAL | Status: DC
  Filled 2020-08-16: qty 1

## 2020-08-16 MED ORDER — VANCOMYCIN HCL 10 G IV SOLR
1750.0000 mg | Freq: Once | INTRAVENOUS | Status: AC
  Administered 2020-08-16: 1750 mg via INTRAVENOUS
  Filled 2020-08-16: qty 1750

## 2020-08-16 MED ORDER — SODIUM CHLORIDE 0.9 % IV SOLN: 10.0000 mL/h | Freq: Once | INTRAVENOUS | Status: DC

## 2020-08-16 MED ORDER — SODIUM CHLORIDE 0.9 % IV SOLN
2.0000 g | Freq: Once | INTRAVENOUS | Status: AC
  Administered 2020-08-16: 2 g via INTRAVENOUS
  Filled 2020-08-16: qty 2

## 2020-08-16 NOTE — Progress Notes (Signed)
VAST consulted to obtain IV access. Pt currently has no IV meds/fluids or studies ordered. Sent SecureChat to PA & MD requesting what type of line would be needed. Currently, no IV needed. Per PA, it will depend on labwork as he is here for altered mental status with VSS. Educated it is best practice not to place an IV that is not currently needed to decrease infection risk and allow for vein preservation. Also included pt's nurse, Dois Davenport in [employer].

## 2020-08-16 NOTE — ED Notes (Signed)
ED Provider at bedside. 

## 2020-08-16 NOTE — ED Triage Notes (Signed)
Has been with [facility] for sacral wound, tx with antibiotics. Now with disorientation, was oriented x 3 now oriented x 2. Shaking noted. Manual BP 110/94, 02 sats  94% on 4 liters 02, 98.6 temp, HR 60, RR 18, CBG nl.

## 2020-08-16 NOTE — ED Notes (Signed)
Pacemaker interrogated, PA Hyman Hopes made aware they will call with the results.

## 2020-08-16 NOTE — Progress Notes (Signed)
Pharmacy Antibiotic Note  Gianni BASEM YANNUZZI is a 80 y.o. male admitted on 08/16/2020 with sacral wound, AMS, hypotension and concern for osteomyelitis.  Pharmacy has been consulted for vancomycin and cefepime dosing.  Plan: Vancomycin 1750 mg IV x 1, then variable dosing due to unstable renal function Cefepime 2g IV every 24 hours Monitor renal function, Cx and clinical progression to narrow Vancomycin levels as needed     Temp (24hrs), Avg:98.9 F (37.2 C), Min:98.9 F (37.2 C), Max:98.9 F (37.2 C)  Recent Labs  Lab 08/16/20 1835  WBC 17.9*  CREATININE 1.76*    CrCl cannot be calculated (Unknown ideal weight.).    No Known Allergies  Daylene Posey, PharmD Clinical Pharmacist ED Pharmacist Phone # [phone] 08/16/2020 10:04 PM

## 2020-08-16 NOTE — ED Notes (Signed)
Lab called, hemoglobin 6.7. Hyman Hopes, [place] made aware.

## 2020-08-16 NOTE — ED Provider Notes (Incomplete)
Patient sent from NH for possible AMS, patient cant give any history due to his AMS, pt has no c/o particularly.  Not able to answer historical questions, no fevers reported  Medical screening examination/treatment/procedure(s) were conducted as a shared visit with non-physician practitioner(s) and myself.  I personally evaluated the patient during the encounter.  Clinical Impression:   Final diagnoses:  None
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
## 2020-08-16 NOTE — ED Notes (Signed)
Patient transported to X-ray 

## 2020-08-17 DIAGNOSIS — T373X5A Adverse effect of other antiprotozoal drugs, initial encounter: Secondary | ICD-10-CM | POA: Diagnosis not present

## 2020-08-17 DIAGNOSIS — Z515 Encounter for palliative care: Secondary | ICD-10-CM | POA: Diagnosis not present

## 2020-08-17 DIAGNOSIS — L0390 Cellulitis, unspecified: Secondary | ICD-10-CM | POA: Diagnosis present

## 2020-08-17 DIAGNOSIS — E43 Unspecified severe protein-calorie malnutrition: Secondary | ICD-10-CM | POA: Diagnosis present

## 2020-08-17 DIAGNOSIS — R627 Adult failure to thrive: Secondary | ICD-10-CM

## 2020-08-17 DIAGNOSIS — D649 Anemia, unspecified: Secondary | ICD-10-CM | POA: Diagnosis not present

## 2020-08-17 DIAGNOSIS — A419 Sepsis, unspecified organism: Secondary | ICD-10-CM | POA: Diagnosis present

## 2020-08-17 DIAGNOSIS — D62 Acute posthemorrhagic anemia: Secondary | ICD-10-CM | POA: Diagnosis present

## 2020-08-17 DIAGNOSIS — R791 Abnormal coagulation profile: Secondary | ICD-10-CM | POA: Diagnosis present

## 2020-08-17 DIAGNOSIS — R6520 Severe sepsis without septic shock: Secondary | ICD-10-CM | POA: Diagnosis present

## 2020-08-17 DIAGNOSIS — N179 Acute kidney failure, unspecified: Secondary | ICD-10-CM

## 2020-08-17 DIAGNOSIS — G4733 Obstructive sleep apnea (adult) (pediatric): Secondary | ICD-10-CM | POA: Diagnosis not present

## 2020-08-17 DIAGNOSIS — L89154 Pressure ulcer of sacral region, stage 4: Secondary | ICD-10-CM | POA: Diagnosis present

## 2020-08-17 DIAGNOSIS — L89150 Pressure ulcer of sacral region, unstageable: Secondary | ICD-10-CM | POA: Diagnosis not present

## 2020-08-17 DIAGNOSIS — Z7189 Other specified counseling: Secondary | ICD-10-CM

## 2020-08-17 DIAGNOSIS — L89893 Pressure ulcer of other site, stage 3: Secondary | ICD-10-CM | POA: Diagnosis present

## 2020-08-17 DIAGNOSIS — J9611 Chronic respiratory failure with hypoxia: Secondary | ICD-10-CM

## 2020-08-17 DIAGNOSIS — L89610 Pressure ulcer of right heel, unstageable: Secondary | ICD-10-CM

## 2020-08-17 DIAGNOSIS — Z9981 Dependence on supplemental oxygen: Secondary | ICD-10-CM | POA: Diagnosis not present

## 2020-08-17 DIAGNOSIS — Z950 Presence of cardiac pacemaker: Secondary | ICD-10-CM | POA: Diagnosis not present

## 2020-08-17 DIAGNOSIS — E871 Hypo-osmolality and hyponatremia: Secondary | ICD-10-CM | POA: Diagnosis present

## 2020-08-17 DIAGNOSIS — I10 Essential (primary) hypertension: Secondary | ICD-10-CM

## 2020-08-17 DIAGNOSIS — Z743 Need for continuous supervision: Secondary | ICD-10-CM | POA: Diagnosis not present

## 2020-08-17 DIAGNOSIS — I5032 Chronic diastolic (congestive) heart failure: Secondary | ICD-10-CM | POA: Diagnosis present

## 2020-08-17 DIAGNOSIS — Z20822 Contact with and (suspected) exposure to covid-19: Secondary | ICD-10-CM | POA: Diagnosis present

## 2020-08-17 DIAGNOSIS — Z66 Do not resuscitate: Secondary | ICD-10-CM | POA: Diagnosis present

## 2020-08-17 DIAGNOSIS — L03319 Cellulitis of trunk, unspecified: Secondary | ICD-10-CM | POA: Diagnosis not present

## 2020-08-17 DIAGNOSIS — I442 Atrioventricular block, complete: Secondary | ICD-10-CM | POA: Diagnosis present

## 2020-08-17 DIAGNOSIS — R279 Unspecified lack of coordination: Secondary | ICD-10-CM | POA: Diagnosis not present

## 2020-08-17 DIAGNOSIS — I130 Hypertensive heart and chronic kidney disease with heart failure and stage 1 through stage 4 chronic kidney disease, or unspecified chronic kidney disease: Secondary | ICD-10-CM | POA: Diagnosis present

## 2020-08-17 DIAGNOSIS — D638 Anemia in other chronic diseases classified elsewhere: Secondary | ICD-10-CM | POA: Diagnosis present

## 2020-08-17 DIAGNOSIS — R531 Weakness: Secondary | ICD-10-CM | POA: Diagnosis not present

## 2020-08-17 DIAGNOSIS — I4821 Permanent atrial fibrillation: Secondary | ICD-10-CM

## 2020-08-17 DIAGNOSIS — G9341 Metabolic encephalopathy: Secondary | ICD-10-CM

## 2020-08-17 DIAGNOSIS — E861 Hypovolemia: Secondary | ICD-10-CM | POA: Diagnosis present

## 2020-08-17 DIAGNOSIS — R4182 Altered mental status, unspecified: Secondary | ICD-10-CM | POA: Diagnosis not present

## 2020-08-17 LAB — VITAMIN B12: Vitamin B-12: 263 pg/mL (ref 180–914)

## 2020-08-17 LAB — COMPREHENSIVE METABOLIC PANEL
ALT: 17 U/L (ref 0–44)
AST: 26 U/L (ref 15–41)
Albumin: 1.3 g/dL — ABNORMAL LOW (ref 3.5–5.0)
Alkaline Phosphatase: 67 U/L (ref 38–126)
Anion gap: 11 (ref 5–15)
BUN: 42 mg/dL — ABNORMAL HIGH (ref 8–23)
CO2: 21 mmol/L — ABNORMAL LOW (ref 22–32)
Calcium: 9.2 mg/dL (ref 8.9–10.3)
Chloride: 101 mmol/L (ref 98–111)
Creatinine, Ser: 1.66 mg/dL — ABNORMAL HIGH (ref 0.61–1.24)
GFR, Estimated: 42 mL/min — ABNORMAL LOW (ref >60)
Glucose, Bld: 122 mg/dL — ABNORMAL HIGH (ref 70–99)
Potassium: 5.4 mmol/L — ABNORMAL HIGH (ref 3.5–5.1)
Sodium: 133 mmol/L — ABNORMAL LOW (ref 135–145)
Total Bilirubin: 0.8 mg/dL (ref 0.3–1.2)
Total Protein: 4.6 g/dL — ABNORMAL LOW (ref 6.5–8.1)

## 2020-08-17 LAB — TSH: TSH: 3.253 u[IU]/mL (ref 0.350–4.500)

## 2020-08-17 LAB — IRON AND TIBC
Iron: 18 ug/dL — ABNORMAL LOW (ref 45–182)
Saturation Ratios: 14 % — ABNORMAL LOW (ref 17.9–39.5)
TIBC: 127 ug/dL — ABNORMAL LOW (ref 250–450)
UIBC: 109 ug/dL

## 2020-08-17 LAB — CBC
HCT: 24.1 % — ABNORMAL LOW (ref 39.0–52.0)
Hemoglobin: 7.3 g/dL — ABNORMAL LOW (ref 13.0–17.0)
MCH: 27.2 pg (ref 26.0–34.0)
MCHC: 30.3 g/dL (ref 30.0–36.0)
MCV: 89.9 fL (ref 80.0–100.0)
Platelets: 498 10*3/uL — ABNORMAL HIGH (ref 150–400)
RBC: 2.68 MIL/uL — ABNORMAL LOW (ref 4.22–5.81)
RDW: 16.3 % — ABNORMAL HIGH (ref 11.5–15.5)
WBC: 17.6 10*3/uL — ABNORMAL HIGH (ref 4.0–10.5)
nRBC: 0 % (ref 0.0–0.2)

## 2020-08-17 LAB — LACTIC ACID, PLASMA: Lactic Acid, Venous: 1.9 mmol/L (ref 0.5–1.9)

## 2020-08-17 LAB — PROTIME-INR
INR: 4.6 (ref 0.8–1.2)
Prothrombin Time: 41.9 seconds — ABNORMAL HIGH (ref 11.4–15.2)

## 2020-08-17 LAB — FOLATE: Folate: 8 ng/mL (ref >5.9)

## 2020-08-17 LAB — APTT: aPTT: 62 seconds — ABNORMAL HIGH (ref 24–36)

## 2020-08-17 LAB — SARS CORONAVIRUS 2 (TAT 6-24 HRS): SARS Coronavirus 2: NEGATIVE

## 2020-08-17 LAB — MRSA PCR SCREENING: MRSA by PCR: NEGATIVE

## 2020-08-17 LAB — MAGNESIUM: Magnesium: 1.5 mg/dL — ABNORMAL LOW (ref 1.7–2.4)

## 2020-08-17 LAB — AMMONIA: Ammonia: 22 umol/L (ref 9–35)

## 2020-08-17 MED ORDER — COLLAGENASE 250 UNIT/GM EX OINT
TOPICAL_OINTMENT | Freq: Every day | CUTANEOUS | Status: DC
  Filled 2020-08-17 (×2): qty 30

## 2020-08-17 MED ORDER — ACETAMINOPHEN 650 MG RE SUPP: 650.0000 mg | Freq: Four times a day (QID) | RECTAL | Status: DC | PRN

## 2020-08-17 MED ORDER — PHYTONADIONE 5 MG PO TABS
2.5000 mg | ORAL_TABLET | Freq: Once | ORAL | Status: DC
  Filled 2020-08-17: qty 1

## 2020-08-17 MED ORDER — ONDANSETRON HCL 4 MG/2ML IJ SOLN: 4.0000 mg | Freq: Four times a day (QID) | INTRAMUSCULAR | Status: DC | PRN

## 2020-08-17 MED ORDER — CYANOCOBALAMIN 1000 MCG/ML IJ SOLN
1000.0000 ug | Freq: Every day | INTRAMUSCULAR | Status: DC
  Administered 2020-08-17 – 2020-08-18 (×2): 1000 ug via SUBCUTANEOUS
  Filled 2020-08-17 (×3): qty 1

## 2020-08-17 MED ORDER — ROSUVASTATIN CALCIUM 5 MG PO TABS
5.0000 mg | ORAL_TABLET | Freq: Every day | ORAL | Status: DC
  Administered 2020-08-17: 5 mg via ORAL
  Filled 2020-08-17: qty 1

## 2020-08-17 MED ORDER — SODIUM ZIRCONIUM CYCLOSILICATE 10 G PO PACK
10.0000 g | PACK | Freq: Two times a day (BID) | ORAL | Status: DC
  Administered 2020-08-17: 10 g via ORAL
  Filled 2020-08-17: qty 1

## 2020-08-17 MED ORDER — METRONIDAZOLE IN NACL 5-0.79 MG/ML-% IV SOLN
500.0000 mg | Freq: Three times a day (TID) | INTRAVENOUS | Status: DC
  Administered 2020-08-17: 500 mg via INTRAVENOUS
  Filled 2020-08-17: qty 100

## 2020-08-17 MED ORDER — CHLORHEXIDINE GLUCONATE CLOTH 2 % EX PADS
6.0000 | MEDICATED_PAD | Freq: Every day | CUTANEOUS | Status: DC
  Administered 2020-08-17 – 2020-08-22 (×6): 6 via TOPICAL

## 2020-08-17 MED ORDER — LACTATED RINGERS IV BOLUS
1000.0000 mL | Freq: Once | INTRAVENOUS | Status: AC
  Administered 2020-08-17: 1000 mL via INTRAVENOUS

## 2020-08-17 MED ORDER — MAGNESIUM SULFATE 2 GM/50ML IV SOLN
2.0000 g | Freq: Once | INTRAVENOUS | Status: AC
  Administered 2020-08-17: 2 g via INTRAVENOUS
  Filled 2020-08-17: qty 50

## 2020-08-17 MED ORDER — ONDANSETRON HCL 4 MG PO TABS: 4.0000 mg | ORAL_TABLET | Freq: Four times a day (QID) | ORAL | Status: DC | PRN

## 2020-08-17 MED ORDER — VANCOMYCIN HCL 1250 MG/250ML IV SOLN: 1250.0000 mg | INTRAVENOUS | Status: DC

## 2020-08-17 MED ORDER — SODIUM CHLORIDE 0.9 % IV SOLN
2.0000 g | Freq: Two times a day (BID) | INTRAVENOUS | Status: DC
  Administered 2020-08-17 – 2020-08-18 (×3): 2 g via INTRAVENOUS
  Filled 2020-08-17 (×3): qty 2

## 2020-08-17 MED ORDER — PANTOPRAZOLE SODIUM 40 MG IV SOLR
40.0000 mg | Freq: Two times a day (BID) | INTRAVENOUS | Status: DC
  Administered 2020-08-17 – 2020-08-18 (×3): 40 mg via INTRAVENOUS
  Filled 2020-08-17 (×3): qty 40

## 2020-08-17 MED ORDER — METOPROLOL TARTRATE 5 MG/5ML IV SOLN
2.5000 mg | Freq: Four times a day (QID) | INTRAVENOUS | Status: DC
  Administered 2020-08-17 – 2020-08-18 (×3): 2.5 mg via INTRAVENOUS
  Filled 2020-08-17 (×3): qty 5

## 2020-08-17 MED ORDER — ACETAMINOPHEN 325 MG PO TABS
650.0000 mg | ORAL_TABLET | Freq: Four times a day (QID) | ORAL | Status: DC | PRN
  Administered 2020-08-18: 650 mg via ORAL
  Filled 2020-08-17: qty 2

## 2020-08-17 MED ORDER — MIDODRINE HCL 5 MG PO TABS
10.0000 mg | ORAL_TABLET | Freq: Three times a day (TID) | ORAL | Status: DC
  Administered 2020-08-18 (×2): 10 mg via ORAL
  Filled 2020-08-17 (×2): qty 2

## 2020-08-17 MED ORDER — LACTATED RINGERS IV SOLN: INTRAVENOUS | Status: AC

## 2020-08-17 NOTE — ED Notes (Signed)
Blood transfusion completed prior to this nurse taking over care of pt.

## 2020-08-17 NOTE — Progress Notes (Signed)
PROGRESS NOTE        PATIENT DETAILS Name: Ruben Walker Age: 80 y.o. Sex: male Date of Birth: 28-Feb-1941 Admit Date: 08/16/2020 Admitting Physician Marinda Elk, MD [id number], Maryjean Morn, MD  Brief Narrative: Patient is a 80 y.o. male chronic respiratory failure on 2 L of oxygen at home, OSA, chronic atrial fibrillation, chronic diastolic heart failure, PPM/AICD placement, history of complete heart block-sent from SNF for worsening sacral wound and acute metabolic encephalopathy.  Significant events: 3/29>> admit for worsening sacral wound/acute metabolic encephalopathy.  Significant studies: 3/29>> CT head: No acute intracranial abnormality. 3/29>> chest x-ray: No pneumonia  Antimicrobial therapy: Vancomycin: 3/29>> Cefepime: 3/29>> Flagyl: 3/29 x 1  Microbiology data: 3/29>> blood culture: Pending  Procedures : None  Consults: General surgery  DVT Prophylaxis : Supratherapeutic INR   Subjective: Lethargic-but much more awake and alert compared to yesterday.  Answering simple questions appropriately.   Assessment/Plan: Sepsis due to sacral wound infection- stage IV sacral decubitus ulcer: Continue broad-spectrum antimicrobial therapy-await CT pelvis-await further recommendations from general surgery.  BP remains soft-we will start midodrine.  Acute metabolic encephalopathy: Due to sepsis/AKI-slowly improving-continue supportive care.  AKI: Likely hemodynamically mediated-Improving with supportive care.  Hyperkalemia: Due to AKI-starting Lokelma-recheck electrolytes tomorrow  Hyponatremia: Mild-probably due to hypovolemia-improving.  Normocytic anemia: Probably due to chronic sacral wound-worsened due to AKI/chronic bleeding from wound.  S/p 1 unit of PRBC-hemoglobin relatively stable this morning.  Continue to follow CBC.  Supratherapeutic INR: We will get 1 dose of vitamin K this morning (never got yesterday per pharmacy)-hold  Coumadin-repeat INR tomorrow morning.  Permanent atrial fibrillation: Continue to monitor on telemetry-on IV metoprolol.  Coumadin on hold-however INR supratherapeutic.  Chronic diastolic heart failure: Has significant lower extremity edema which I suspect is mostly from hypoalbuminemia.  Will attempt diuresis when blood pressure is more stable.  HLD: Continue statin  GERD: Continue PPI  Chronic hypoxic respiratory failure-on home O2  Goals of care: DNR in place-very poor overall prognosis-await arrival of family-await further recommendations from the palliative care team.  Obesity: Estimated body mass index is 35.12 kg/m as calculated from the following:   Height as of 02/22/20: 5\' 2"  (1.575 m).   Weight as of 03/28/20: 87.1 kg.    Diet: Diet Order            Diet NPO time specified Except for: Ice Chips, Sips with Meds  Diet effective now                  Code Status: DNR  Family Communication: None at bedside-nephew on the way from 13/8/21 Per H&P-admitting MD spoke with nephew overnight.  Disposition Plan: Status is: Observation  The patient will require care spanning > 2 midnights and should be moved to inpatient because: Inpatient level of care appropriate due to severity of illness  Dispo: The patient is from: SNF              Anticipated d/c is to: SNF              Patient currently is not medically stable to d/c.   Difficult to place patient No   Barriers to Discharge: Sepsis-AKI-acute metabolic encephalopathy-on IV antibiotics-awaiting surgical input for debridement of sacral decubitus.  Antimicrobial agents: Anti-infectives (From admission, onward)   Start     Dose/Rate Route Frequency Ordered Stop  08/18/20 2330  vancomycin (VANCOREADY) IVPB 1250 mg/250 mL        1,250 mg 166.7 mL/hr over 90 Minutes Intravenous Every 48 hours 08/17/20 0902     08/17/20 2300  ceFEPIme (MAXIPIME) 2 g in sodium chloride 0.9 % 100 mL IVPB  Status:  Discontinued         2 g 200 mL/hr over 30 Minutes Intravenous Every 24 hours 08/16/20 2215 08/17/20 0902   08/17/20 1030  ceFEPIme (MAXIPIME) 2 g in sodium chloride 0.9 % 100 mL IVPB        2 g 200 mL/hr over 30 Minutes Intravenous Every 12 hours 08/17/20 0902     08/17/20 0200  metroNIDAZOLE (FLAGYL) IVPB 500 mg  Status:  Discontinued        500 mg 100 mL/hr over 60 Minutes Intravenous Every 8 hours 08/17/20 0132 08/17/20 0931   08/16/20 2214  vancomycin variable dose per unstable renal function (pharmacist dosing)  Status:  Discontinued         Does not apply See admin instructions 08/16/20 2215 08/17/20 0902   08/16/20 2200  ceFEPIme (MAXIPIME) 2 g in sodium chloride 0.9 % 100 mL IVPB        2 g 200 mL/hr over 30 Minutes Intravenous  Once 08/16/20 2148 08/16/20 2300   08/16/20 2200  vancomycin (VANCOCIN) 1,750 mg in sodium chloride 0.9 % 500 mL IVPB        1,750 mg 250 mL/hr over 120 Minutes Intravenous  Once 08/16/20 2148 08/17/20 0134       Time spent: 35 minutes-Greater than 50% of this time was spent in counseling, explanation of diagnosis, planning of further management, and coordination of care.  MEDICATIONS: Scheduled Meds: . cyanocobalamin  1,000 mcg Subcutaneous Daily  . metoprolol tartrate  2.5 mg Intravenous Q6H  . pantoprazole (PROTONIX) IV  40 mg Intravenous BID  . phytonadione  2.5 mg Oral Once  . rosuvastatin  5 mg Oral QHS  . sodium zirconium cyclosilicate  10 g Oral BID   Continuous Infusions: . sodium chloride    . ceFEPime (MAXIPIME) IV    . lactated ringers 125 mL/hr at 08/17/20 0644  . magnesium sulfate bolus IVPB    . [START ON 08/18/2020] vancomycin     PRN Meds:.acetaminophen **OR** acetaminophen, ondansetron **OR** ondansetron (ZOFRAN) IV   PHYSICAL EXAM: Vital signs: Vitals:   08/17/20 0512 08/17/20 0715 08/17/20 0745 08/17/20 0851  BP: (!) 109/96 92/74 (!) 131/38 (!) 89/66  Pulse: 73 73 72 71  Resp: (!) 22 (!) 25 20 20   Temp: 98.1 F (36.7 C)     TempSrc:  Oral     SpO2: 100% 99% 99% 99%   There were no vitals filed for this visit. There is no height or weight on file to calculate BMI.   Gen Exam: Confused-but not in any distress. HEENT:atraumatic, normocephalic Chest: B/L clear to auscultation anteriorly CVS:S1S2 regular Abdomen:soft non tender, non distended Extremities:+ edema Neurology: Difficult exam but seems to be moving all 4 extremities-has severe generalized weakness. Skin: no rash  I have personally reviewed following labs and imaging studies  LABORATORY DATA: CBC: Recent Labs  Lab 08/16/20 1835 08/17/20 0340  WBC 17.9* 17.6*  NEUTROABS 12.4*  --   HGB 6.7* 7.3*  HCT 21.7* 24.1*  MCV 82.8 89.9  PLT 589* 498*    Basic Metabolic Panel: Recent Labs  Lab 08/16/20 1835 08/17/20 0340  NA 131* 133*  K 4.9 5.4*  CL 100 101  CO2 25 21*  GLUCOSE 75 122*  BUN 48* 42*  CREATININE 1.76* 1.66*  CALCIUM 9.9 9.2  MG 1.7 1.5*    GFR: CrCl cannot be calculated (Unknown ideal weight.).  Liver Function Tests: Recent Labs  Lab 08/16/20 1835 08/17/20 0340  AST 28 26  ALT 22 17  ALKPHOS 79 67  BILITOT 0.6 0.8  PROT 5.1* 4.6*  ALBUMIN 1.4* 1.3*   No results for input(s): LIPASE, AMYLASE in the last 168 hours. No results for input(s): AMMONIA in the last 168 hours.  Coagulation Profile: Recent Labs  Lab 08/16/20 1835 08/17/20 0340  INR 5.2* 4.6*    Cardiac Enzymes: No results for input(s): CKTOTAL, CKMB, CKMBINDEX, TROPONINI in the last 168 hours.  BNP (last 3 results) No results for input(s): PROBNP in the last 8760 hours.  Lipid Profile: No results for input(s): CHOL, HDL, LDLCALC, TRIG, CHOLHDL, LDLDIRECT in the last 72 hours.  Thyroid Function Tests: Recent Labs    08/17/20 0340  TSH 3.253    Anemia Panel: Recent Labs    08/17/20 0340 08/17/20 0400  VITAMINB12  --  263  FOLATE 8.0  --   TIBC  --  127*  IRON  --  18*    Urine analysis:    Component Value Date/Time   COLORURINE  YELLOW 08/16/2020 1643   APPEARANCEUR CLEAR 08/16/2020 1643   LABSPEC 1.009 08/16/2020 1643   PHURINE 7.0 08/16/2020 1643   GLUCOSEU NEGATIVE 08/16/2020 1643   HGBUR NEGATIVE 08/16/2020 1643   BILIRUBINUR NEGATIVE 08/16/2020 1643   KETONESUR NEGATIVE 08/16/2020 1643   PROTEINUR NEGATIVE 08/16/2020 1643   NITRITE NEGATIVE 08/16/2020 1643   LEUKOCYTESUR NEGATIVE 08/16/2020 1643    Sepsis Labs: Lactic Acid, Venous    Component Value Date/Time   LATICACIDVEN 1.9 08/17/2020 0340    MICROBIOLOGY: Recent Results (from the past 240 hour(s))  SARS CORONAVIRUS 2 (TAT 6-24 HRS) Nasopharyngeal Nasopharyngeal Swab     Status: None   Collection Time: 08/16/20  9:34 PM   Specimen: Nasopharyngeal Swab  Result Value Ref Range Status   SARS Coronavirus 2 NEGATIVE NEGATIVE Final    Comment: (NOTE) SARS-CoV-2 target nucleic acids are NOT DETECTED.  The SARS-CoV-2 RNA is generally detectable in upper and lower respiratory specimens during the acute phase of infection. Negative results do not preclude SARS-CoV-2 infection, do not rule out co-infections with other pathogens, and should not be used as the sole basis for treatment or other patient management decisions. Negative results must be combined with clinical observations, patient history, and epidemiological information. The expected result is Negative.  Fact Sheet for Patients: HairSlick.no  Fact Sheet for Healthcare Providers: quierodirigir.com  This test is not yet approved or cleared by the [place] FDA and  has been authorized for detection and/or diagnosis of SARS-CoV-2 by FDA under an Emergency Use Authorization (EUA). This EUA will remain  in effect (meaning this test can be used) for the duration of the COVID-19 declaration under Se ction 564(b)(1) of the Act, 21 U.S.C. section 360bbb-3(b)(1), unless the authorization is terminated or revoked sooner.  Performed at  [facility], [address] [place], [place] [address]   Culture, blood (routine x 2)     Status: None (Preliminary result)   Collection Time: 08/16/20  9:55 PM   Specimen: BLOOD  Result Value Ref Range Status   Specimen Description BLOOD RIGHT ANTECUBITAL  Final   Special Requests   Final    BOTTLES DRAWN AEROBIC AND ANAEROBIC  Blood Culture adequate volume   Culture   Final    NO GROWTH < 12 HOURS Performed at [facility], [address], [place], [place] [address]    Report Status PENDING  Incomplete  Culture, blood (routine x 2)     Status: None (Preliminary result)   Collection Time: 08/16/20 10:05 PM   Specimen: BLOOD RIGHT HAND  Result Value Ref Range Status   Specimen Description BLOOD RIGHT HAND  Final   Special Requests   Final    BOTTLES DRAWN AEROBIC AND ANAEROBIC Blood Culture results may not be optimal due to an excessive volume of blood received in culture bottles   Culture   Final    NO GROWTH < 12 HOURS Performed at [facility], [address], [place], [place] [address]    Report Status PENDING  Incomplete    RADIOLOGY STUDIES/RESULTS: CT Head Wo Contrast  Result Date: 08/16/2020 CLINICAL DATA:  Mental status change. EXAM: CT HEAD WITHOUT CONTRAST TECHNIQUE: Contiguous axial images were obtained from the base of the skull through the vertex without intravenous contrast. COMPARISON:  06/14/2020. FINDINGS: Brain: Remote right MCA territory are infarct with encephalomalacia, unchanged. No evidence of acute large vascular territory infarct. No acute hemorrhage. Similar generalized cerebral volume loss with ex vacuo ventricular dilation. No hydrocephalus. No midline shift. Basal cisterns are patent. Vascular: Calcific atherosclerosis. No hyperdense vessel identified. Skull: No acute fracture. Sinuses/Orbits: Visualized sinuses are largely clear. Unremarkable visualized orbits. Other: No mastoid effusions. IMPRESSION: 1. No evidence of acute  intracranial abnormality. 2. Remote right MCA territory infarct. Electronically Signed   By: Feliberto Harts MD   On: 08/16/2020 17:47   DG Lumbar Spine 1 View  Result Date: 08/16/2020 CLINICAL DATA:  Osteoarthritis of lumbar spine. EXAM: LUMBAR SPINE - 1 VIEW COMPARISON:  None. FINDINGS: Single frontal view of the lumbar spine. Normal alignment. Vertebral body heights are grossly normal. No severe disc space narrowing. No obvious fracture. No bony destruction visualized on this single view. IMPRESSION: Single frontal view of the lumbar spine without acute findings. No significant degenerative changes noted on AP view. Electronically Signed   By: Narda Rutherford M.D.   On: 08/16/2020 23:38   DG Chest Portable 1 View  Result Date: 08/16/2020 CLINICAL DATA:  Change in mental status EXAM: PORTABLE CHEST 1 VIEW COMPARISON:  07/19/2020 FINDINGS: Single frontal view of the chest was obtained with the patient rotated to the left. Single lead AICD unchanged. Cardiac silhouette is stable. No airspace disease, effusion, or pneumothorax. IMPRESSION: 1. No acute intrathoracic process. Electronically Signed   By: Sharlet Salina M.D.   On: 08/16/2020 17:31     LOS: 0 days   [name], MD  [facility]    To contact the attending provider between 7A-7P or the covering provider during after hours 7P-7A, please log into the web site [email or web address] and access using universal [place] password for that web site. If you do not have the password, please call the hospital operator.  08/17/2020, 10:32 AM

## 2020-08-17 NOTE — ED Notes (Signed)
Lab called- PT 41.9 and INR 4.6. Dr. Leafy Half made aware.
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
## 2020-08-17 NOTE — Progress Notes (Addendum)
Pharmacy Antibiotic Note  Ruben Walker is a 80 y.o. male admitted on 08/16/2020 with sacral wound, AMS, hypotension and concern for osteomyelitis.  Pharmacy has been consulted for vancomycin and cefepime dosing - day #2. SCr trended down to 1.66 today.  Plan: Continue vancomycin 1250mg  IV q48h. Goal AUC 400-550. Expected AUC: 515 SCr used: 1.66 Change cefepime to 2g IV q12h Monitor clinical progress, c/s, renal function F/u de-escalation plan/LOT, vancomycin levels as indicated      Temp (24hrs), Avg:98.6 F (37 C), Min:98.1 F (36.7 C), Max:98.9 F (37.2 C)  Recent Labs  Lab 08/16/20 1835 08/16/20 2145 08/17/20 0340  WBC 17.9*  --  17.6*  CREATININE 1.76*  --  1.66*  LATICACIDVEN  --  1.2 1.9    CrCl cannot be calculated (Unknown ideal weight.).    No Known Allergies   08/19/20, PharmD, BCPS Please check AMION for all [facility] contact numbers Clinical Pharmacist 08/17/2020 9:00 AM

## 2020-08-17 NOTE — Consult Note (Addendum)
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 baseline Chronic atrial fibrillation (S/P multiple DCCV and ablation) on coumadin Diastolic CHF (Echo 2018 EF 55-60%) Hx cardiac arrest s/p pacemaker/AICD Complete heart block HTN HLD GERD OSA Code status DNR AKI on CKD stage III  AMS Supratherapeutic INR - INR 5.2>>4.6. Given vitamin K  Sacral decubitus - Patient sacral wound as picture above. The inferior aspect of  the wound is noted to have some  sloughing necrotic tissue as seen in the picture. The base of the wound otherwise is with beefy red, bleeding grannulation tissue. There is some fibrinous tissue centrally that overlies palpable bone. No drainage or purulence. Peri wound without signs of infection. No indication for emergency surgery. Would recommend santyl WTD dressing changes BID and start Hydrotherapy. When INR is corrected (currently supratherapeutic) may need sharp debridement at bedside if not improving with hydrotherapy and santyl dressing changes. Frequent turns (q2 hrs) and air mattress for pressure displacement. Will ask WOC nurse to see as well for any further recommendations. To note, patient could have underlying osteomyelitis given palpable bone on exam but has never had formal imaging of this in our system. Will defer to primary team if they wish to pursue this. We will follow along with you.  ID - maxipime 3/29>>, vancomycin 3/29>>, flagyl 3/29>> VTE - SCDs FEN - IVF per TRH, currently NPO - can have a diet from our standpoint Foley - chronic indwelling foley catheter in place Follow up - TBD   Leary Roca, [facility] 08/17/2020, 11:11 AM Please see Amion for pager number during day hours 7:00am-4:30pm

## 2020-08-17 NOTE — Progress Notes (Signed)
Patient admitted to 5W from ED. Patient is alert and oriented only to self. Vital signs are stable and he is on room air. Has no complaints of pain, only has pain with movement. Patient has a large stage 4 pressure injury on the sacrum, a stage 2 pressure injury on the right lower shin and deep tissue pressure injuries on both heels. He also has 4+ pitting edema in lower extremities and 2+ in both upper extermities. Patient belongings at bedside (clothing). The patient was shown how to use the call bell. Call bell, phone and bedside table are within reach; bed is in the lowest position.

## 2020-08-17 NOTE — Consult Note (Signed)
WOC Nurse Consult Note: Patient receiving care in [facility] ED23.  General surgery was also consulted for care of the sacral wound. Reason for Consult: sacral wound Wound type: stage 4 PI. I have reviewed the patient's record and recommendations ordered by PA M. Maczis.  No additional wound care recommendations at this time. The patient was not seen, and will not be followed by the [facility] team.  Please see existing orders for wound care. WOC nurse will not follow at this time.  Please re-consult the WOC team if needed.  Helmut Muster, RN, MSN, CWOCN, CNS-BC, pager [phone]

## 2020-08-18 DIAGNOSIS — L03319 Cellulitis of trunk, unspecified: Secondary | ICD-10-CM | POA: Diagnosis not present

## 2020-08-18 DIAGNOSIS — G9341 Metabolic encephalopathy: Secondary | ICD-10-CM | POA: Diagnosis not present

## 2020-08-18 DIAGNOSIS — L89154 Pressure ulcer of sacral region, stage 4: Secondary | ICD-10-CM | POA: Diagnosis not present

## 2020-08-18 DIAGNOSIS — R627 Adult failure to thrive: Secondary | ICD-10-CM | POA: Diagnosis not present

## 2020-08-18 DIAGNOSIS — R4182 Altered mental status, unspecified: Secondary | ICD-10-CM | POA: Diagnosis not present

## 2020-08-18 DIAGNOSIS — N179 Acute kidney failure, unspecified: Secondary | ICD-10-CM | POA: Diagnosis not present

## 2020-08-18 LAB — BPAM RBC
Blood Product Expiration Date: 202204262359
ISSUE DATE / TIME: 202203300309
Unit Type and Rh: 5100

## 2020-08-18 LAB — TYPE AND SCREEN
ABO/RH(D): O POS
Antibody Screen: NEGATIVE
Unit division: 0

## 2020-08-18 LAB — COMPREHENSIVE METABOLIC PANEL
ALT: 18 U/L (ref 0–44)
AST: 32 U/L (ref 15–41)
Albumin: 1.4 g/dL — ABNORMAL LOW (ref 3.5–5.0)
Alkaline Phosphatase: 64 U/L (ref 38–126)
Anion gap: 8 (ref 5–15)
BUN: 39 mg/dL — ABNORMAL HIGH (ref 8–23)
CO2: 20 mmol/L — ABNORMAL LOW (ref 22–32)
Calcium: 9.9 mg/dL (ref 8.9–10.3)
Chloride: 107 mmol/L (ref 98–111)
Creatinine, Ser: 1.48 mg/dL — ABNORMAL HIGH (ref 0.61–1.24)
GFR, Estimated: 48 mL/min — ABNORMAL LOW (ref >60)
Glucose, Bld: 80 mg/dL (ref 70–99)
Potassium: 4.4 mmol/L (ref 3.5–5.1)
Sodium: 135 mmol/L (ref 135–145)
Total Bilirubin: 1.2 mg/dL (ref 0.3–1.2)
Total Protein: 4.8 g/dL — ABNORMAL LOW (ref 6.5–8.1)

## 2020-08-18 LAB — PROTIME-INR
INR: 5.9 (ref 0.8–1.2)
Prothrombin Time: 51.4 seconds — ABNORMAL HIGH (ref 11.4–15.2)

## 2020-08-18 LAB — CBC
HCT: 23.1 % — ABNORMAL LOW (ref 39.0–52.0)
Hemoglobin: 7.2 g/dL — ABNORMAL LOW (ref 13.0–17.0)
MCH: 26.3 pg (ref 26.0–34.0)
MCHC: 31.2 g/dL (ref 30.0–36.0)
MCV: 84.3 fL (ref 80.0–100.0)
Platelets: 506 10*3/uL — ABNORMAL HIGH (ref 150–400)
RBC: 2.74 MIL/uL — ABNORMAL LOW (ref 4.22–5.81)
RDW: 16.7 % — ABNORMAL HIGH (ref 11.5–15.5)
WBC: 15.5 10*3/uL — ABNORMAL HIGH (ref 4.0–10.5)
nRBC: 0 % (ref 0.0–0.2)

## 2020-08-18 MED ORDER — ADULT MULTIVITAMIN W/MINERALS CH
1.0000 | ORAL_TABLET | Freq: Every day | ORAL | Status: DC
  Administered 2020-08-18: 1 via ORAL
  Filled 2020-08-18: qty 1

## 2020-08-18 MED ORDER — MORPHINE SULFATE (CONCENTRATE) 10 MG/0.5ML PO SOLN
5.0000 mg | ORAL | Status: DC | PRN
  Administered 2020-08-20 – 2020-08-22 (×7): 5 mg via ORAL
  Filled 2020-08-18 (×7): qty 0.5

## 2020-08-18 MED ORDER — ENSURE ENLIVE PO LIQD
237.0000 mL | Freq: Three times a day (TID) | ORAL | Status: DC
  Administered 2020-08-18 – 2020-08-22 (×9): 237 mL via ORAL

## 2020-08-18 MED ORDER — PROSOURCE PLUS PO LIQD: 30.0000 mL | Freq: Two times a day (BID) | ORAL | Status: DC

## 2020-08-18 MED ORDER — PHYTONADIONE 5 MG PO TABS
5.0000 mg | ORAL_TABLET | Freq: Once | ORAL | Status: AC
  Administered 2020-08-18: 5 mg via ORAL
  Filled 2020-08-18: qty 1

## 2020-08-18 MED ORDER — LORAZEPAM 2 MG/ML IJ SOLN
1.0000 mg | INTRAMUSCULAR | Status: DC | PRN
  Administered 2020-08-20: 1 mg via INTRAVENOUS
  Filled 2020-08-18: qty 1

## 2020-08-18 NOTE — Progress Notes (Addendum)
PROGRESS NOTE        PATIENT DETAILS Name: Ruben Walker Age: 80 y.o. Sex: male Date of Birth: 09-08-1940 Admit Date: 08/16/2020 Admitting Physician Dewayne Shorter Levora Dredge, MD [id number], Maryjean Morn, MD  Brief Narrative: Patient is a 80 y.o. male chronic respiratory failure on 2 L of oxygen at home, OSA, chronic atrial fibrillation, chronic diastolic heart failure, PPM/AICD placement, history of complete heart block-sent from SNF for worsening sacral wound and acute metabolic encephalopathy.  Significant events: 3/29>> admit for worsening sacral wound/acute metabolic encephalopathy.  Significant studies: 3/29>> CT head: No acute intracranial abnormality. 3/29>> chest x-ray: No pneumonia  Antimicrobial therapy: Vancomycin: 3/29>> Cefepime: 3/29>> Flagyl: 3/29 x 1  Microbiology data: 3/29>> blood culture: No growth  Procedures : None  Consults: General surgery  DVT Prophylaxis : Supratherapeutic INR   Subjective: Much more awake compared to yesterday-appears frail-chronically sick appearing.  Answers simple question.   Assessment/Plan: Sepsis due to sacral wound infection- stage IV sacral decubitus ulcer: Continue broad-spectrum antimicrobial therapy-plans were to do a CT pelvis to rule out osteomyelitis-however I have deferred this for now-as goals of care discussion ongoing with family.  Patient is clearly not a candidate for aggressive care-from my conversation with the patient's nephew today-family does not want aggressive care-family meeting scheduled for 4/1.  Continue wound care per general surgery.  BP remains soft-remains on midodrine.    Acute metabolic encephalopathy: Due to sepsis/AKI-more awake compared to yesterday-answering simple questions.  Unclear what his usual baseline is.  Continue supportive care slowly   AKI: Likely hemodynamically mediated-continues to improve with supportive care.    Hyperkalemia: Due to [facility] with  Lokelma.  Hyponatremia: Mild-probably due to hypovolemia-improving.  Normocytic anemia: Probably due to chronic sacral wound-worsened due to AKI/chronic bleeding from wound.  S/p 1 unit of PRBC-hemoglobin relatively stable this morning.  Continue to follow CBC.  Supratherapeutic INR: Due to n.p.o. status-never got vitamin K yesterday-plans are to give vitamin K today.  INR bumped up today-likely due to Flagyl that he received in the emergency room.  Continue to hold Coumadin-repeat INR tomorrow morning.    Permanent atrial fibrillation: Continue to monitor on telemetry-on IV metoprolol.  Coumadin on hold-however INR supratherapeutic.  Chronic diastolic heart failure: Has significant lower extremity edema which I suspect is mostly from hypoalbuminemia.  Will attempt diuresis when blood pressure is more stable.  HLD: Continue statin  GERD: Continue PPI  Chronic hypoxic respiratory failure-on home O2  Goals of care: DNR in place-very poor overall prognosis-from my brief discussion with patient's nephew on 3/31-family understands that patient is not a candidate for aggressive care-overall goals are for comfort-family meeting scheduled for 4/1-where further goals of care can be delineated.  In the meantime continue with current therapy-will not plan on any further escalation in care at this point.  Palliative care recommendations pending as well.  Addendum: informed by Palliative care NP Molli Knock patient being transitioned to comfort care. Family still scheduled for tomorrow morning.   Obesity: Estimated body mass index is 35.12 kg/m as calculated from the following:   Height as of 02/22/20: 5\' 2"  (1.575 m).   Weight as of 03/28/20: 87.1 kg.    Diet: Diet Order            Diet NPO time specified Except for: Ice Chips, Sips with Meds  Diet effective now  Code Status: DNR  Family Communication: Nephew-Dennis- [phone] over the phone on 3/31  Disposition  Plan: Status is: Inpatient  The patient will require care spanning > 2 midnights and should be moved to inpatient because: Inpatient level of care appropriate due to severity of illness  Dispo: The patient is from: SNF              Anticipated d/c is to: SNF              Patient currently is not medically stable to d/c.   Difficult to place patient No   Barriers to Discharge: Sepsis-AKI-acute metabolic encephalopathy-on IV antibiotics-awaiting surgical input for debridement of sacral decubitus.  Antimicrobial agents: Anti-infectives (From admission, onward)   Start     Dose/Rate Route Frequency Ordered Stop   08/18/20 2300  vancomycin (VANCOREADY) IVPB 1250 mg/250 mL        1,250 mg 166.7 mL/hr over 90 Minutes Intravenous Every 48 hours 08/17/20 0902     08/17/20 2300  ceFEPIme (MAXIPIME) 2 g in sodium chloride 0.9 % 100 mL IVPB  Status:  Discontinued        2 g 200 mL/hr over 30 Minutes Intravenous Every 24 hours 08/16/20 2215 08/17/20 0902   08/17/20 1030  ceFEPIme (MAXIPIME) 2 g in sodium chloride 0.9 % 100 mL IVPB        2 g 200 mL/hr over 30 Minutes Intravenous Every 12 hours 08/17/20 0902     08/17/20 0200  metroNIDAZOLE (FLAGYL) IVPB 500 mg  Status:  Discontinued        500 mg 100 mL/hr over 60 Minutes Intravenous Every 8 hours 08/17/20 0132 08/17/20 0931   08/16/20 2214  vancomycin variable dose per unstable renal function (pharmacist dosing)  Status:  Discontinued         Does not apply See admin instructions 08/16/20 2215 08/17/20 0902   08/16/20 2200  ceFEPIme (MAXIPIME) 2 g in sodium chloride 0.9 % 100 mL IVPB        2 g 200 mL/hr over 30 Minutes Intravenous  Once 08/16/20 2148 08/16/20 2300   08/16/20 2200  vancomycin (VANCOCIN) 1,750 mg in sodium chloride 0.9 % 500 mL IVPB        1,750 mg 250 mL/hr over 120 Minutes Intravenous  Once 08/16/20 2148 08/17/20 0134       Time spent: 35 minutes-Greater than 50% of this time was spent in counseling, explanation of  diagnosis, planning of further management, and coordination of care.  MEDICATIONS: Scheduled Meds: . (feeding supplement) PROSource Plus  30 mL Oral BID BM  . Chlorhexidine Gluconate Cloth  6 each Topical Daily  . collagenase   Topical Daily  . cyanocobalamin  1,000 mcg Subcutaneous Daily  . feeding supplement  237 mL Oral TID BM  . metoprolol tartrate  2.5 mg Intravenous Q6H  . midodrine  10 mg Oral TID WC  . multivitamin with minerals  1 tablet Oral Daily  . pantoprazole (PROTONIX) IV  40 mg Intravenous BID  . phytonadione  5 mg Oral Once  . rosuvastatin  5 mg Oral QHS   Continuous Infusions: . sodium chloride    . ceFEPime (MAXIPIME) IV 2 g (08/18/20 0953)  . vancomycin     PRN Meds:.acetaminophen **OR** acetaminophen, ondansetron **OR** ondansetron (ZOFRAN) IV   PHYSICAL EXAM: Vital signs: Vitals:   08/18/20 0400 08/18/20 0543 08/18/20 0700 08/18/20 1100  BP:  (!) 88/49 (!) 88/38   Pulse: 73  70 71  Resp: 20  19 20   Temp: 98 F (36.7 C)  97.6 F (36.4 C)   TempSrc: Axillary  Axillary   SpO2:   99% 100%   There were no vitals filed for this visit. There is no height or weight on file to calculate BMI.   Gen Exam:Alert awake-not in any distress-answering simple questions-speaks with a very soft voice. HEENT:atraumatic, normocephalic Chest: B/L clear to auscultation anteriorly CVS:S1S2 regular Abdomen:soft non tender, non distended Extremities: 2+ edema Neurology: Has severe generalized weakness-but appears nonfocal-difficult exam. Skin: no rash  I have personally reviewed following labs and imaging studies  LABORATORY DATA: CBC: Recent Labs  Lab 08/16/20 1835 08/17/20 0340 08/18/20 0139  WBC 17.9* 17.6* 15.5*  NEUTROABS 12.4*  --   --   HGB 6.7* 7.3* 7.2*  HCT 21.7* 24.1* 23.1*  MCV 82.8 89.9 84.3  PLT 589* 498* 506*    Basic Metabolic Panel: Recent Labs  Lab 08/16/20 1835 08/17/20 0340 08/18/20 0139  NA 131* 133* 135  K 4.9 5.4* 4.4  CL 100  101 107  CO2 25 21* 20*  GLUCOSE 75 122* 80  BUN 48* 42* 39*  CREATININE 1.76* 1.66* 1.48*  CALCIUM 9.9 9.2 9.9  MG 1.7 1.5*  --     GFR: CrCl cannot be calculated (Unknown ideal weight.).  Liver Function Tests: Recent Labs  Lab 08/16/20 1835 08/17/20 0340 08/18/20 0139  AST 28 26 32  ALT 22 17 18   ALKPHOS 79 67 64  BILITOT 0.6 0.8 1.2  PROT 5.1* 4.6* 4.8*  ALBUMIN 1.4* 1.3* 1.4*   No results for input(s): LIPASE, AMYLASE in the last 168 hours. Recent Labs  Lab 08/17/20 0903  AMMONIA 22    Coagulation Profile: Recent Labs  Lab 08/16/20 1835 08/17/20 0340 08/18/20 0139  INR 5.2* 4.6* 5.9*    Cardiac Enzymes: No results for input(s): CKTOTAL, CKMB, CKMBINDEX, TROPONINI in the last 168 hours.  BNP (last 3 results) No results for input(s): PROBNP in the last 8760 hours.  Lipid Profile: No results for input(s): CHOL, HDL, LDLCALC, TRIG, CHOLHDL, LDLDIRECT in the last 72 hours.  Thyroid Function Tests: Recent Labs    08/17/20 0340  TSH 3.253    Anemia Panel: Recent Labs    08/17/20 0340 08/17/20 0400  VITAMINB12  --  263  FOLATE 8.0  --   TIBC  --  127*  IRON  --  18*    Urine analysis:    Component Value Date/Time   COLORURINE YELLOW 08/16/2020 1643   APPEARANCEUR CLEAR 08/16/2020 1643   LABSPEC 1.009 08/16/2020 1643   PHURINE 7.0 08/16/2020 1643   GLUCOSEU NEGATIVE 08/16/2020 1643   HGBUR NEGATIVE 08/16/2020 1643   BILIRUBINUR NEGATIVE 08/16/2020 1643   KETONESUR NEGATIVE 08/16/2020 1643   PROTEINUR NEGATIVE 08/16/2020 1643   NITRITE NEGATIVE 08/16/2020 1643   LEUKOCYTESUR NEGATIVE 08/16/2020 1643    Sepsis Labs: Lactic Acid, Venous    Component Value Date/Time   LATICACIDVEN 1.9 08/17/2020 0340    MICROBIOLOGY: Recent Results (from the past 240 hour(s))  SARS CORONAVIRUS 2 (TAT 6-24 HRS) Nasopharyngeal Nasopharyngeal Swab     Status: None   Collection Time: 08/16/20  9:34 PM   Specimen: Nasopharyngeal Swab  Result Value Ref  Range Status   SARS Coronavirus 2 NEGATIVE NEGATIVE Final    Comment: (NOTE) SARS-CoV-2 target nucleic acids are NOT DETECTED.  The SARS-CoV-2 RNA is generally detectable in upper and lower respiratory specimens during the acute phase of infection. Negative  results do not preclude SARS-CoV-2 infection, do not rule out co-infections with other pathogens, and should not be used as the sole basis for treatment or other patient management decisions. Negative results must be combined with clinical observations, patient history, and epidemiological information. The expected result is Negative.  Fact Sheet for Patients: HairSlick.no  Fact Sheet for Healthcare Providers: quierodirigir.com  This test is not yet approved or cleared by the [place] FDA and  has been authorized for detection and/or diagnosis of SARS-CoV-2 by FDA under an Emergency Use Authorization (EUA). This EUA will remain  in effect (meaning this test can be used) for the duration of the COVID-19 declaration under Se ction 564(b)(1) of the Act, 21 U.S.C. section 360bbb-3(b)(1), unless the authorization is terminated or revoked sooner.  Performed at [facility], [address] [place], [place] [address]   Culture, blood (routine x 2)     Status: None (Preliminary result)   Collection Time: 08/16/20  9:55 PM   Specimen: BLOOD  Result Value Ref Range Status   Specimen Description BLOOD RIGHT ANTECUBITAL  Final   Special Requests   Final    BOTTLES DRAWN AEROBIC AND ANAEROBIC Blood Culture adequate volume   Culture   Final    NO GROWTH 2 DAYS Performed at [facility], [address], [place], [place] [address]    Report Status PENDING  Incomplete  Culture, blood (routine x 2)     Status: None (Preliminary result)   Collection Time: 08/16/20 10:05 PM   Specimen: BLOOD RIGHT HAND  Result Value Ref Range Status   Specimen Description BLOOD RIGHT  HAND  Final   Special Requests   Final    BOTTLES DRAWN AEROBIC AND ANAEROBIC Blood Culture results may not be optimal due to an excessive volume of blood received in culture bottles   Culture   Final    NO GROWTH 2 DAYS Performed at [facility], [address] [place], [place] [address]    Report Status PENDING  Incomplete  MRSA PCR Screening     Status: None   Collection Time: 08/17/20  5:36 PM   Specimen: Nasal Mucosa; Nasopharyngeal  Result Value Ref Range Status   MRSA by PCR NEGATIVE NEGATIVE Final    Comment:        The GeneXpert MRSA Assay (FDA approved for NASAL specimens only), is one component of a comprehensive MRSA colonization surveillance program. It is not intended to diagnose MRSA infection nor to guide or monitor treatment for MRSA infections. Performed at [facility], [address], [place], [place] [address]     RADIOLOGY STUDIES/RESULTS: CT Head Wo Contrast  Result Date: 08/16/2020 CLINICAL DATA:  Mental status change. EXAM: CT HEAD WITHOUT CONTRAST TECHNIQUE: Contiguous axial images were obtained from the base of the skull through the vertex without intravenous contrast. COMPARISON:  06/14/2020. FINDINGS: Brain: Remote right MCA territory are infarct with encephalomalacia, unchanged. No evidence of acute large vascular territory infarct. No acute hemorrhage. Similar generalized cerebral volume loss with ex vacuo ventricular dilation. No hydrocephalus. No midline shift. Basal cisterns are patent. Vascular: Calcific atherosclerosis. No hyperdense vessel identified. Skull: No acute fracture. Sinuses/Orbits: Visualized sinuses are largely clear. Unremarkable visualized orbits. Other: No mastoid effusions. IMPRESSION: 1. No evidence of acute intracranial abnormality. 2. Remote right MCA territory infarct. Electronically Signed   By: Feliberto Harts MD   On: 08/16/2020 17:47   DG Lumbar Spine 1 View  Result Date:  08/16/2020 CLINICAL DATA:   Osteoarthritis of lumbar spine. EXAM: LUMBAR SPINE - 1 VIEW COMPARISON:  None. FINDINGS: Single frontal view of the lumbar spine. Normal alignment. Vertebral body heights are grossly normal. No severe disc space narrowing. No obvious fracture. No bony destruction visualized on this single view. IMPRESSION: Single frontal view of the lumbar spine without acute findings. No significant degenerative changes noted on AP view. Electronically Signed   By: Narda Rutherford M.D.   On: 08/16/2020 23:38   DG Chest Portable 1 View  Result Date: 08/16/2020 CLINICAL DATA:  Change in mental status EXAM: PORTABLE CHEST 1 VIEW COMPARISON:  07/19/2020 FINDINGS: Single frontal view of the chest was obtained with the patient rotated to the left. Single lead AICD unchanged. Cardiac silhouette is stable. No airspace disease, effusion, or pneumothorax. IMPRESSION: 1. No acute intrathoracic process. Electronically Signed   By: Sharlet Salina M.D.   On: 08/16/2020 17:31     LOS: 1 day   [name], MD  [facility]    To contact the attending provider between 7A-7P or the covering provider during after hours 7P-7A, please log into the web site [email or web address] and access using universal [place] password for that web site. If you do not have the password, please call the hospital operator.  08/18/2020, 12:06 PM

## 2020-08-18 NOTE — Progress Notes (Signed)
Initial Nutrition Assessment  DOCUMENTATION CODES:  Severe malnutrition in context of chronic illness  INTERVENTION:   Please obtain updated weight.  Advance diet as medically able.  Add MVI with minerals daily.  Once diet is advanced, add:  Ensure Enlive po TID, each supplement provides 350 kcal and 20 grams of protein.  Magic cup TID with meals, each supplement provides 290 kcal and 9 grams of protein.  30 ml ProSource Plus po BID, each supplement provides 100 kcal and 15 grams of protein.   NUTRITION DIAGNOSIS:  Severe Malnutrition related to chronic illness (CHF, A-fib, chronic respiratory failure, CKD stage 3) as evidenced by severe fat depletion,severe muscle depletion.  GOAL:  Patient will meet greater than or equal to 90% of their needs  MONITOR:  PO intake,Diet advancement,Supplement acceptance,Skin,Weight trends,I & O's  REASON FOR ASSESSMENT:  Consult Assessment of nutrition requirement/status,Poor PO,Wound healing  ASSESSMENT:  80 yo male with a PMH of CHF (2 L pm via [place]), chronic A-fib (s/p multiple DCCV and ablation), chronic respiratory failure (Echo 2018 EF 55-60%), cardiac arrest in 2000 with ICD placement for secondary prevention, CKD stage 3, complete heart block, and GERD who presents with cellulitis of sacral region, AKI, acute metabolic encephalopathy. Palliative care consulted and discussing goals of care. Surgery and WOC nurse to determine if surgery is needed if dressing changes, rotation, and hydrotherapy do not help.  Pt with AMS and poor historian. No intake documented as pt is currently NPO. From previous admission in early March, it is noted that pt has had poor PO intake for quite some time. Attempted to review weight history, no updated weight since 03/28/20. Please obtain updated weight at soon as possible. Will be using 87.1 kg to estimate needs.  Given severe malnutrition diagnosis and poor wound healing, pt's estimated needs are higher.  Recommend adding Ensure, Magic Cup, ProSource Plus, and MVI to help with wound healing and increase caloric and protein intake.  Relevant Medications: Vitamin B12 1000 mcg injection, midodrine, Protonix, Vitamin K 2.5 mg, cefepime, vancomycin Labs: reviewed  NUTRITION - FOCUSED PHYSICAL EXAM: Flowsheet Row Most Recent Value  Orbital Region Severe depletion  Upper Arm Region Moderate depletion  Thoracic and Lumbar Region Moderate depletion  Buccal Region Moderate depletion  Temple Region Moderate depletion  Clavicle Bone Region Mild depletion  Clavicle and Acromion Bone Region Moderate depletion  Scapular Bone Region Moderate depletion  Dorsal Hand Severe depletion  Patellar Region Severe depletion  Anterior Thigh Region Severe depletion  Posterior Calf Region Severe depletion  Edema (RD Assessment) Moderate  Hair Reviewed  Eyes Reviewed  [pale conjunctiva]  Mouth Reviewed  Skin Reviewed  Nails Reviewed  [pale nail beds]     Diet Order:   Diet Order            Diet NPO time specified Except for: Ice Chips, Sips with Meds  Diet effective now                EDUCATION NEEDS:  Not appropriate for education at this time  Skin:  Skin Integrity Issues:: DTI,Stage II,Unstageable DTI: Bilateral heels Stage II: Bilateral buttocks Unstageable: Sacrum  Last BM:  08/18/20 - Type 3, VERY large per nursing  Height:  Ht Readings from Last 1 Encounters:  02/22/20 5\' 2"  (1.575 m)   Weight:  Wt Readings from Last 1 Encounters:  03/28/20 87.1 kg   Ideal Body Weight:  53.6 kg  BMI:  There is no height or weight on file to calculate  BMI.  Estimated Nutritional Needs:  Kcal:  2150-2350 Protein:  105-115 grams Fluid:  >2 L  Vertell Limber, RD, LDN Registered Dietitian After Hours/Weekend Pager # in [place]
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
## 2020-08-18 NOTE — Progress Notes (Signed)
Physical Therapy Wound Treatment Patient Details  Name: Ruben Walker MRN: [id number] Date of Birth: Sep 13, 1940  Today's Date: 08/18/2020 Time: 1015-1055 Time Calculation (min): 40 min  Subjective  Subjective Assessment Patient and Family Stated Goals: pt unable to participate Date of Onset:  (unknown, but prior to admission) Prior Treatments: unknown  Pain Score:  4-6/10  Premedicated  Wound Assessment  Pressure Injury 07/20/20 Sacrum Unstageable - Full thickness tissue loss in which the base of the injury is covered by slough (yellow, tan, gray, green or brown) and/or eschar (tan, brown or black) in the wound bed. Tunnelling, gray eschar (Active)  Wound Image   08/18/20 1207  Dressing Type ABD;Barrier Film (skin prep);Gauze (Comment);Normal saline moist dressing;Santyl 08/18/20 1207  Dressing Changed;Clean;Dry;Intact 08/18/20 1207  Dressing Change Frequency Twice a day 08/18/20 1207  State of Healing Eschar 08/18/20 1207  Site / Wound Assessment Bleeding;Black;Ow[place]ark;Purple;Red;Yellow 08/18/20 1207  % Wound base Red or Granulating 20% 08/18/20 1207  % Wound base Yellow/Fibrinous Exudate 40% 08/18/20 1207  % Wound base Black/Eschar 20% 08/18/20 1207  % Wound base Other/Granulation Tissue (Comment) 20% 08/18/20 1207  Peri-wound Assessment Intact;Erythema (blanchable) 08/18/20 1207  Wound Length (cm) 10.5 cm 08/18/20 1207  Wound Width (cm) 9 cm 08/18/20 1207  Wound Depth (cm) 3 cm 08/18/20 1207  Wound Surface Area (cm^2) 94.5 cm^2 08/18/20 1207  Wound Volume (cm^3) 283.5 cm^3 08/18/20 1207  Tunneling (cm) 8 07/20/20 0022  Undermining (cm) 9*--2.5, 8*--  4.0, 12*--3.0 08/18/20 1207  Margins Unattached edges (unapproximated) 08/18/20 1207  Drainage Amount Moderate 08/18/20 1207  Drainage Description Serosanguineous 08/18/20 1207  Treatment Cleansed;Debridement (Selective);Hydrotherapy (Pulse lavage);Packing (Saline gauze) 08/18/20 1207   Santyl applied to wound bed prior to  applying dressing.    Hydrotherapy Pulsed lavage therapy - wound location: sacral Pulsed Lavage with Suction (psi): 8 psi (4 to 12 psi) Pulsed Lavage with Suction - Normal Saline Used: 1000 mL Pulsed Lavage Tip: Tip with splash shield Selective Debridement Selective Debridement - Location: sacral Selective Debridement - Tools Used: Forceps,Scalpel,Scissors Selective Debridement - Tissue Removed: black/brown and yellow eschar/  yellow slough.    Wound Assessment and Plan  Wound Therapy - Assess/Plan/Recommendations Wound Therapy - Clinical Statement: pt may benefit from hydrotherapy most from the pulsed lavage to decrease the bacterial burden, but also to debride and soften tissues for selective debridement. Wound Therapy - Functional Problem List: mobility issues. Factors Delaying/Impairing Wound Healing: Multiple medical problems,Immobility,Vascular compromise Hydrotherapy Plan: Debridement,Dressing change,Patient/family education,Pulsatile lavage with suction Wound Therapy - Frequency: 6X / week Wound Therapy - Follow Up Recommendations: dressing changes by RN  Wound Therapy Goals- Improve the function of patient's integumentary system by progressing the wound(s) through the phases of wound healing (inflammation - proliferation - remodeling) by: Wound Therapy Goals - Improve the function of patient's integumentary system by progressing the wound(s) through the phases of wound healing by: Decrease Necrotic Tissue to: 40% Decrease Necrotic Tissue - Progress: Goal set today Increase Granulation Tissue to: 60% Increase Granulation Tissue - Progress: Goal set today Goals/treatment plan/discharge plan were made with and agreed upon by patient/family: Yes Time For Goal Achievement: 7 days Wound Therapy - Potential for Goals: Fair  Goals will be updated until maximal potential achieved or discharge criteria met.  Discharge criteria: when goals achieved, discharge from hospital, MD  decision/surgical intervention, no progress towards goals, refusal/missing three consecutive treatments without notification or medical reason.  GP     Charges PT Wound Care Charges $Wound Debridement up to 20 cm: <  or equal to 20 cm $ Wound Debridement each add'l 20 sqcm: 3 $PT PLS Gun and Tip: 1 Supply $PT Hydrotherapy Visit: 1 Visit     08/18/2020  Ken M., PT Acute Rehabilitation Services [phone]  (pager) [phone]  (office)  Ruben Walker 08/18/2020, 12:18 PM   

## 2020-08-19 DIAGNOSIS — L89154 Pressure ulcer of sacral region, stage 4: Secondary | ICD-10-CM | POA: Diagnosis not present

## 2020-08-19 DIAGNOSIS — L03319 Cellulitis of trunk, unspecified: Secondary | ICD-10-CM | POA: Diagnosis not present

## 2020-08-19 DIAGNOSIS — D62 Acute posthemorrhagic anemia: Secondary | ICD-10-CM | POA: Diagnosis not present

## 2020-08-19 DIAGNOSIS — R6520 Severe sepsis without septic shock: Secondary | ICD-10-CM

## 2020-08-19 DIAGNOSIS — N179 Acute kidney failure, unspecified: Secondary | ICD-10-CM | POA: Diagnosis not present

## 2020-08-19 DIAGNOSIS — Z515 Encounter for palliative care: Secondary | ICD-10-CM

## 2020-08-19 DIAGNOSIS — G9341 Metabolic encephalopathy: Secondary | ICD-10-CM | POA: Diagnosis not present

## 2020-08-19 DIAGNOSIS — A419 Sepsis, unspecified organism: Principal | ICD-10-CM

## 2020-08-19 MED ORDER — BISACODYL 10 MG RE SUPP
10.0000 mg | Freq: Every day | RECTAL | Status: DC | PRN
  Administered 2020-08-22: 10 mg via RECTAL
  Filled 2020-08-19: qty 1

## 2020-08-19 NOTE — TOC Initial Note (Signed)
Transition of Care [facility]) - Initial/Assessment Note    Patient Details  Name: Ruben Walker MRN: [id number] Date of Birth: June 30, 1940  [facility]) CM/SW Contact:    Mearl Latin, LCSW Phone Number: 08/19/2020, 1:59 PM  Clinical Narrative:                 CSW received consult regarding residential hospice placement. Patient's family has a preference of [facility] [address], [address] CSW sent referral for review, though they do not have beds available today.   Expected Discharge Plan: Hospice Medical Facility Barriers to Discharge: Hospice Bed not available   Patient Goals and CMS Choice Patient states their goals for this hospitalization and ongoing recovery are:: comfort CMS Medicare.gov Compare Post Acute Care list provided to:: Patient Represenative (must comment) Choice offered to / list presented to : Sibling  Expected Discharge Plan and Services Expected Discharge Plan: Hospice Medical Facility In-house Referral: Clinical Social Work   Post Acute Care Choice: Hospice Living arrangements for the past 2 months: Skilled Nursing Facility                             [facility] Agency: [facility] [place] Date [facility] Agency Contacted: 08/19/20 Time HH Agency Contacted: [id number] Representative spoke with at [facility] Agency: Cheri  Prior Living Arrangements/Services Living arrangements for the past 2 months: Skilled Nursing Facility Lives with:: Facility Resident Patient language and need for interpreter reviewed:: Yes Do you feel safe going back to the place where you live?: Yes      Need for Family Participation in Patient Care: Yes (Comment) Care giver support system in place?: Yes (comment)   Criminal Activity/Legal Involvement Pertinent to Current Situation/Hospitalization: No - Comment as needed  Activities of Daily Living Home Assistive Devices/Equipment: None ADL Screening (condition at time of admission) Patient's cognitive ability adequate to safely  complete daily activities?: No Is the patient deaf or have difficulty hearing?: No Does the patient have difficulty seeing, even when wearing glasses/contacts?: No Does the patient have difficulty concentrating, remembering, or making decisions?: Yes Patient able to express need for assistance with ADLs?: Yes Does the patient have difficulty dressing or bathing?: Yes Independently performs ADLs?: No Communication: Independent Dressing (OT): Dependent Is this a change from baseline?: Pre-admission baseline Grooming: Dependent Is this a change from baseline?: Pre-admission baseline Feeding: Needs assistance Is this a change from baseline?: Pre-admission baseline Bathing: Dependent Is this a change from baseline?: Pre-admission baseline Toileting: Dependent Is this a change from baseline?: Pre-admission baseline In/Out Bed: Dependent Is this a change from baseline?: Pre-admission baseline Walks in Home: Dependent Is this a change from baseline?: Pre-admission baseline Does the patient have difficulty walking or climbing stairs?: Yes Weakness of Legs: Both Weakness of Arms/Hands: None  Permission Sought/Granted Permission sought to share information with : Facility Contact Representative,Family Supports Permission granted to share information with : No  Share Information with NAME: Nephew  Permission granted to share info w AGENCY: Hospice  Permission granted to share info w Relationship: Nephew  Permission granted to share info w Contact Information: [phone]  Emotional Assessment Appearance:: Appears stated age Attitude/Demeanor/Rapport: Unable to Assess Affect (typically observed): Unable to Assess Orientation: :  (Disoriented) Alcohol / Substance Use: Not Applicable Psych Involvement: No (comment)  Admission diagnosis:  Osteoarthritis of lumbar spine [M47.816] Altered mental status, unspecified altered mental status type [R41.82] Sepsis due to cellulitis (HCC) [L03.90,  A41.9] Pressure injury  of sacral region, stage 4 (HCC) [L89.154] Sepsis with acute renal failure without septic shock, due to unspecified organism, unspecified acute renal failure type (HCC) [A41.9, R65.20, N17.9] Patient Active Problem List   Diagnosis Date Noted  . Acute metabolic encephalopathy 08/17/2020  . Stage IV pressure ulcer of sacral region (HCC) 08/17/2020  . Chronic respiratory failure with hypoxia (HCC) 08/17/2020  . Failure to thrive in adult 08/17/2020  . Goals of care, counseling/discussion 08/17/2020  . Pressure ulcer of right leg, stage 3 (HCC) 08/17/2020  . Supratherapeutic INR 08/17/2020  . Acute blood loss anemia 08/17/2020  . Sepsis due to cellulitis (HCC) 08/17/2020  . Sepsis (HCC) 07/19/2020  . Hyponatremia 07/19/2020  . History of COVID-19 07/19/2020  . Acute on chronic respiratory failure with hypoxia (HCC) 07/19/2020  . Complicated urinary tract infection 07/19/2020  . Cellulitis of sacral region 07/19/2020  . Severe protein-calorie malnutrition [name]: less than 60% of standard weight) (HCC) 06/22/2020  . Frequent falls 06/14/2020  . Bilateral knee pain 05/06/2019  . Pain in joint of left elbow 05/06/2019  . Pain of left hip joint 05/01/2019  . Pressure injury of right heel, unstageable (HCC) 08/14/2016  . Acute respiratory failure with hypoxemia (HCC) 08/13/2016  . AKI (acute kidney injury) (HCC) 08/13/2016  . Chronic diastolic heart failure (HCC)   . Edema leg 02/11/2015  . Obesity 07/22/2014  . ICD (implantable cardioverter-defibrillator) in place 07/22/2014  . Skin lesion of right leg 06/03/2014  . Bronchitis, chronic obstructive, with exacerbation (HCC) 01/04/2014  . Complete heart block  s/p AV ablation 03/26/2013  . Cardiac arrest (HCC) 03/26/2013  . Obstructive sleep apnea 03/26/2013  . Permanent atrial fibrillation (HCC) 09/06/2012  . Hyperlipidemia 02/04/2009  . Essential hypertension 02/04/2009   PCP:  [name], MD Pharmacy:    [facility] [place], [place] - [address] [place] [address] Phone: [phone] Fax: [phone]     Social Determinants of Health (SDOH) Interventions    Readmission Risk Interventions No flowsheet data found.

## 2020-08-19 NOTE — Progress Notes (Signed)
   I have received referral from the [facility]. I have reached out and spoke to the pt's nephew Maurine Minister who is in agreement with comfort care. He will sign paperwork by email when we are able to accept the pt. We unfortunately today can not accept pt due to capacity at our location. Once we have something open up then we will reach out and let the hospital staff know. Norm Parcel RN [phone]

## 2020-08-19 NOTE — Progress Notes (Signed)
Subjective: CC: Patient denies any pain to his sacrum.  Appears he was made comfort care yesterday  RN reports that hydro signed off yesterday  Objective: Vital signs in last 24 hours: Temp:  [97.3 F (36.3 C)-97.6 F (36.4 C)] 97.5 F (36.4 C) (04/01 0800) Pulse Rate:  [70-75] 70 (04/01 0800) Resp:  [19-23] 19 (04/01 0800) BP: (99-107)/(48-88) 99/48 (04/01 0800) SpO2:  [99 %-100 %] 100 % (04/01 0800)    Intake/Output from previous day: 03/31 0701 - 04/01 0700 In: 240 [P.O.:240] Out: 1075 [Urine:1075] Intake/Output this shift: Total I/O In: -  Out: 175 [Urine:175]  PE: Sacral wound: Sacral wound as pictured below. The inferior aspect of the wound is noted to have some sloughing necrotic tissue as seen in the picture. The base of the wound otherwise is with beefy red, bleeding granulation tissue. There is some fibrinous tissue centrally that overlies palpable bone. No drainage or purulence. Peri wound without signs of infection. Wound measures 12x8x6cm. There is 3cm of undermining tracking towards to the left shoulder.     Lab Results:  Recent Labs    08/17/20 0340 08/18/20 0139  WBC 17.6* 15.5*  HGB 7.3* 7.2*  HCT 24.1* 23.1*  PLT 498* 506*   BMET Recent Labs    08/17/20 0340 08/18/20 0139  NA 133* 135  K 5.4* 4.4  CL 101 107  CO2 21* 20*  GLUCOSE 122* 80  BUN 42* 39*  CREATININE 1.66* 1.48*  CALCIUM 9.2 9.9   PT/INR Recent Labs    08/17/20 0340 08/18/20 0139  LABPROT 41.9* 51.4*  INR 4.6* 5.9*   CMP     Component Value Date/Time   NA 135 08/18/2020 0139   NA 139 03/09/2019 1307   K 4.4 08/18/2020 0139   CL 107 08/18/2020 0139   CO2 20 (L) 08/18/2020 0139   GLUCOSE 80 08/18/2020 0139   BUN 39 (H) 08/18/2020 0139   BUN 39 (H) 03/09/2019 1307   CREATININE 1.48 (H) 08/18/2020 0139   CREATININE 1.82 (H) 08/03/2016 1227   CALCIUM 9.9 08/18/2020 0139   PROT 4.8 (L) 08/18/2020 0139   PROT 6.6 03/09/2019 1307   ALBUMIN 1.4 (L)  08/18/2020 0139   ALBUMIN 4.6 03/09/2019 1307   AST 32 08/18/2020 0139   ALT 18 08/18/2020 0139   ALKPHOS 64 08/18/2020 0139   BILITOT 1.2 08/18/2020 0139   BILITOT 0.6 03/09/2019 1307   GFRNONAA 48 (L) 08/18/2020 0139   GFRAA 41 (L) 03/09/2019 1307   Lipase  No results found for: LIPASE     Studies/Results: No results found.  Anti-infectives: Anti-infectives (From admission, onward)   Start     Dose/Rate Route Frequency Ordered Stop   08/18/20 2300  vancomycin (VANCOREADY) IVPB 1250 mg/250 mL  Status:  Discontinued        1,250 mg 166.7 mL/hr over 90 Minutes Intravenous Every 48 hours 08/17/20 0902 08/18/20 1428   08/17/20 2300  ceFEPIme (MAXIPIME) 2 g in sodium chloride 0.9 % 100 mL IVPB  Status:  Discontinued        2 g 200 mL/hr over 30 Minutes Intravenous Every 24 hours 08/16/20 2215 08/17/20 0902   08/17/20 1030  ceFEPIme (MAXIPIME) 2 g in sodium chloride 0.9 % 100 mL IVPB  Status:  Discontinued        2 g 200 mL/hr over 30 Minutes Intravenous Every 12 hours 08/17/20 0902 08/18/20 1428   08/17/20 0200  metroNIDAZOLE (FLAGYL) IVPB 500 mg  Status:  Discontinued        500 mg 100 mL/hr over 60 Minutes Intravenous Every 8 hours 08/17/20 0132 08/17/20 0931   08/16/20 2214  vancomycin variable dose per unstable renal function (pharmacist dosing)  Status:  Discontinued         Does not apply See admin instructions 08/16/20 2215 08/17/20 0902   08/16/20 2200  ceFEPIme (MAXIPIME) 2 g in sodium chloride 0.9 % 100 mL IVPB        2 g 200 mL/hr over 30 Minutes Intravenous  Once 08/16/20 2148 08/16/20 2300   08/16/20 2200  vancomycin (VANCOCIN) 1,750 mg in sodium chloride 0.9 % 500 mL IVPB        1,750 mg 250 mL/hr over 120 Minutes Intravenous  Once 08/16/20 2148 08/17/20 0134       Assessment/Plan Sacral decubitus - Patient sacral wound as picture above. The inferior aspect of the wound is noted to have some sloughing necrotic tissue as seen in the picture. The base of the  wound otherwise is with beefy red, bleeding grannulation tissue. There is some fibrinous tissue centrally that overlies palpable bone. No drainage or purulence. Peri wound without signs of infection.  - Appears patient was made comfort care yesterday. No new recs. Our team will sign off. If anything changes, please do not hesitate to reach back out.    LOS: 2 days    Jacinto Halim , [facility] Surgery 08/19/2020, 9:50 AM Please see Amion for pager number during day hours 7:00am-4:30pm

## 2020-08-19 NOTE — Progress Notes (Signed)
Medical records reviewed. Discussed with RN Ryan who assists with repositioning patient in bed. Assessed the patient and met along with his sister Roberta and nephew Mike.   Patient denies pain or distress, appreciative of small sips of water. Minimal intake of comfort foods such as strawberry ice cream. 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 He tells me that he is uncertain of where he is but he knows where he is going; he looks forward to seeing his family in [place].   Roberta is tearful and displays appropriate grief. Therapeutic listening and emotional support provided. She informs me that she has researched [facility] [place] after discussions with my colleague Mary Larach NP. She is impressed with her findings and voices preference for this residential hospice during patient's end of life journey. Family understands that anything can happen at any time, and they appreciate the care that patient is receiving with a comfort focused approach.   Questions and concerns addressed. PMT will continue to support holistically.  Assessment: worsening sacral wound, acute encephalopathy   Plan: -Pt is stable for transport to residential hospice -Discussed with Wendi CM, Nadia LCSW via secure chat  -appreciate TOC assistance with referral to HOP -Continue symptom management: Tylenol PRN, Roxanol PRN, Ativan PRN, Zofran PRN, Bisacodyl PRN, palliative wound care, frequent turns   Total time: 25 minutes Greater than 50% of this time was spent in counseling and coordinating of care related to the above assessment and plan.   Josseline Cooper, PA-C Palliative Medicine Team Team phone # [phone]  Thank you for allowing the Palliative Medicine Team to assist in the care of this patient. Please utilize secure chat with additional questions, if there is no response within 30 minutes please call the above phone  number.  Palliative Medicine Team providers are available by phone from 7am to 7pm daily and can be reached through the team cell phone.  Should this patient require assistance outside of these hours, please call the patient's attending physician.  

## 2020-08-19 NOTE — Progress Notes (Signed)
This chaplain responded to PMT consult for spiritual care.  The Pt. family is at the bedside at the beginning of the visit and later leaves to begin journey home. The Pt. is alert, but confused, accepting the family's support for clarity.  The chaplain listens and affirms the Pt. as he chooses to talk about forgiveness and his relationship with God. The Pt. shares he is part of the [facility] faith tradition.    The Pt. experiences challenges in responding when the chaplain asks about the Pt. "I am" statements on the wall.  The Pt. selects "Oh How I Love Jesus' to listen to with the chaplain.  The Pt. is appreciative of the visit and accepting of the chaplain's intercessory prayer.

## 2020-08-19 NOTE — Progress Notes (Addendum)
PROGRESS NOTE        PATIENT DETAILS Name: Ruben Walker Age: 80 y.o. Sex: male Date of Birth: Aug 03, 1940 Admit Date: 08/16/2020 Admitting Physician Dewayne Shorter Levora Dredge, MD [id number], Maryjean Morn, MD  Brief Narrative: Patient is a 80 y.o. male chronic respiratory failure on 2 L of oxygen at home, OSA, chronic atrial fibrillation, chronic diastolic heart failure, PPM/AICD placement, history of complete heart block-sent from SNF for worsening sacral wound and acute metabolic encephalopathy.  Significant events: 3/29>> admit for worsening sacral wound/acute metabolic encephalopathy. 3/31>> transition to comfort care  Significant studies: 3/29>> CT head: No acute intracranial abnormality. 3/29>> chest x-ray: No pneumonia  Antimicrobial therapy: Vancomycin: 3/29>> 3/31 Cefepime: 3/29>> 3/31 Flagyl: 3/29 x 1  Microbiology data: 3/29>> blood culture: No growth  Procedures : None  Consults: General surgery, palliative care  DVT Prophylaxis : Supratherapeutic INR>> now transition to comfort care-does not require prophylaxis.   Subjective: Alert-but confused.   Assessment/Plan: Sepsis due to sacral wound infection- stage IV sacral decubitus ulcer: Initially started on broad-spectrum antimicrobial therapy-wound care-General surgery was consulted.  Given overall poor prognosis-palliative care was consulted-after discussion with family-patient was transitioned to full comfort measures on 3/31.  Oral antibiotics-hydrotherapy has been discontinued-continue palliative wound care.   Acute metabolic encephalopathy: Due to sepsis/AKI-improved compared to admission-unclear what his baseline is-he is still somewhat confused.  AKI: Likely hemodynamically mediated-improved with supportive care.  No further lab draws as patient full comfort measures.  Hyperkalemia: Due to [facility] with Lokelma.  Hyponatremia: Mild-probably due to hypovolemia-improved with  IVF.  Normocytic anemia: Probably due to chronic sacral wound-worsened due to AKI/chronic bleeding from wound.  Received 1 unit of PRBC.  Supratherapeutic INR: Coumadin was held-vitamin K was given.  No further INR checks as patient on comfort status-no longer on Coumadin.  Permanent atrial fibrillation: Initially on beta-blocker-INR supratherapeutic-currently transition to full comfort measures-no longer on beta-blocker-Coumadin discontinued.  Chronic diastolic heart failure: Continues to have lower extremity edema which is suspected to be mostly from hypoalbuminemia  HLD: Continue statin  GERD: Continue PPI  Chronic hypoxic respiratory failure-on home O2  Chronic indwelling Foley catheter: Being continued-as patient on comfort care.  Unclear why patient has a chronic indwelling Foley catheter  Severe malnutrition   Goals of care: DNR in place-very poor prognosis-this MD discussed with family on 3/31-palliative care also discussed with family-subsequently transition to full comfort measures.  Awaiting residential hospice bed.   Obesity: Estimated body mass index is 35.12 kg/m as calculated from the following:   Height as of 02/22/20: 5\' 2"  (1.575 m).   Weight as of 03/28/20: 87.1 kg.    Diet: Diet Order            Diet regular Room service appropriate? No; Fluid consistency: Thin  Diet effective now                  Code Status: DNR  Family Communication: Nephew-Dennis- [phone] over the phone on 3/31  Disposition Plan: Status is: Inpatient  The patient will require care spanning > 2 midnights and should be moved to inpatient because: Inpatient level of care appropriate due to severity of illness  Dispo: The patient is from: SNF              Anticipated d/c is to: SNF  Patient currently is medically stable to d/c.   Difficult to place patient No   Barriers to Discharge: Awaiting residential hospice bed  Antimicrobial agents: Anti-infectives  (From admission, onward)   Start     Dose/Rate Route Frequency Ordered Stop   08/18/20 2300  vancomycin (VANCOREADY) IVPB 1250 mg/250 mL  Status:  Discontinued        1,250 mg 166.7 mL/hr over 90 Minutes Intravenous Every 48 hours 08/17/20 0902 08/18/20 1428   08/17/20 2300  ceFEPIme (MAXIPIME) 2 g in sodium chloride 0.9 % 100 mL IVPB  Status:  Discontinued        2 g 200 mL/hr over 30 Minutes Intravenous Every 24 hours 08/16/20 2215 08/17/20 0902   08/17/20 1030  ceFEPIme (MAXIPIME) 2 g in sodium chloride 0.9 % 100 mL IVPB  Status:  Discontinued        2 g 200 mL/hr over 30 Minutes Intravenous Every 12 hours 08/17/20 0902 08/18/20 1428   08/17/20 0200  metroNIDAZOLE (FLAGYL) IVPB 500 mg  Status:  Discontinued        500 mg 100 mL/hr over 60 Minutes Intravenous Every 8 hours 08/17/20 0132 08/17/20 0931   08/16/20 2214  vancomycin variable dose per unstable renal function (pharmacist dosing)  Status:  Discontinued         Does not apply See admin instructions 08/16/20 2215 08/17/20 0902   08/16/20 2200  ceFEPIme (MAXIPIME) 2 g in sodium chloride 0.9 % 100 mL IVPB        2 g 200 mL/hr over 30 Minutes Intravenous  Once 08/16/20 2148 08/16/20 2300   08/16/20 2200  vancomycin (VANCOCIN) 1,750 mg in sodium chloride 0.9 % 500 mL IVPB        1,750 mg 250 mL/hr over 120 Minutes Intravenous  Once 08/16/20 2148 08/17/20 0134       Time spent: 35 minutes-Greater than 50% of this time was spent in counseling, explanation of diagnosis, planning of further management, and coordination of care.  MEDICATIONS: Scheduled Meds: . Chlorhexidine Gluconate Cloth  6 each Topical Daily  . collagenase   Topical Daily  . feeding supplement  237 mL Oral TID BM   Continuous Infusions: . sodium chloride     PRN Meds:.acetaminophen **OR** acetaminophen, LORazepam, morphine CONCENTRATE, ondansetron **OR** ondansetron (ZOFRAN) IV   PHYSICAL EXAM: Vital signs: Vitals:   08/18/20 1205 08/18/20 1810 08/18/20  2100 08/19/20 0800  BP: 107/88  (!) 101/49 (!) 99/48  Pulse: 70 71 75 70  Resp: 20 (!) 23 19 19   Temp: (!) 97.3 F (36.3 C)  97.6 F (36.4 C) (!) 97.5 F (36.4 C)  TempSrc: Axillary  Oral Axillary  SpO2: 100% 99% 99% 100%   There were no vitals filed for this visit. There is no height or weight on file to calculate BMI.   Gen Exam: Somewhat alert-not in any distress-confused. HEENT:atraumatic, normocephalic Chest: B/L clear to auscultation anteriorly CVS:S1S2 regular Abdomen:soft non tender, non distended Extremities:no edema Neurology: Generalized weakness but moving all 4 extremities. Skin: no rash  I have personally reviewed following labs and imaging studies  LABORATORY DATA: CBC: Recent Labs  Lab 08/16/20 1835 08/17/20 0340 08/18/20 0139  WBC 17.9* 17.6* 15.5*  NEUTROABS 12.4*  --   --   HGB 6.7* 7.3* 7.2*  HCT 21.7* 24.1* 23.1*  MCV 82.8 89.9 84.3  PLT 589* 498* 506*    Basic Metabolic Panel: Recent Labs  Lab 08/16/20 1835 08/17/20 0340 08/18/20 0139  NA 131*  133* 135  K 4.9 5.4* 4.4  CL 100 101 107  CO2 25 21* 20*  GLUCOSE 75 122* 80  BUN 48* 42* 39*  CREATININE 1.76* 1.66* 1.48*  CALCIUM 9.9 9.2 9.9  MG 1.7 1.5*  --     GFR: CrCl cannot be calculated (Unknown ideal weight.).  Liver Function Tests: Recent Labs  Lab 08/16/20 1835 08/17/20 0340 08/18/20 0139  AST 28 26 32  ALT 22 17 18   ALKPHOS 79 67 64  BILITOT 0.6 0.8 1.2  PROT 5.1* 4.6* 4.8*  ALBUMIN 1.4* 1.3* 1.4*   No results for input(s): LIPASE, AMYLASE in the last 168 hours. Recent Labs  Lab 08/17/20 0903  AMMONIA 22    Coagulation Profile: Recent Labs  Lab 08/16/20 1835 08/17/20 0340 08/18/20 0139  INR 5.2* 4.6* 5.9*    Cardiac Enzymes: No results for input(s): CKTOTAL, CKMB, CKMBINDEX, TROPONINI in the last 168 hours.  BNP (last 3 results) No results for input(s): PROBNP in the last 8760 hours.  Lipid Profile: No results for input(s): CHOL, HDL, LDLCALC,  TRIG, CHOLHDL, LDLDIRECT in the last 72 hours.  Thyroid Function Tests: Recent Labs    08/17/20 0340  TSH 3.253    Anemia Panel: Recent Labs    08/17/20 0340 08/17/20 0400  VITAMINB12  --  263  FOLATE 8.0  --   TIBC  --  127*  IRON  --  18*    Urine analysis:    Component Value Date/Time   COLORURINE YELLOW 08/16/2020 1643   APPEARANCEUR CLEAR 08/16/2020 1643   LABSPEC 1.009 08/16/2020 1643   PHURINE 7.0 08/16/2020 1643   GLUCOSEU NEGATIVE 08/16/2020 1643   HGBUR NEGATIVE 08/16/2020 1643   BILIRUBINUR NEGATIVE 08/16/2020 1643   KETONESUR NEGATIVE 08/16/2020 1643   PROTEINUR NEGATIVE 08/16/2020 1643   NITRITE NEGATIVE 08/16/2020 1643   LEUKOCYTESUR NEGATIVE 08/16/2020 1643    Sepsis Labs: Lactic Acid, Venous    Component Value Date/Time   LATICACIDVEN 1.9 08/17/2020 0340    MICROBIOLOGY: Recent Results (from the past 240 hour(s))  SARS CORONAVIRUS 2 (TAT 6-24 HRS) Nasopharyngeal Nasopharyngeal Swab     Status: None   Collection Time: 08/16/20  9:34 PM   Specimen: Nasopharyngeal Swab  Result Value Ref Range Status   SARS Coronavirus 2 NEGATIVE NEGATIVE Final    Comment: (NOTE) SARS-CoV-2 target nucleic acids are NOT DETECTED.  The SARS-CoV-2 RNA is generally detectable in upper and lower respiratory specimens during the acute phase of infection. Negative results do not preclude SARS-CoV-2 infection, do not rule out co-infections with other pathogens, and should not be used as the sole basis for treatment or other patient management decisions. Negative results must be combined with clinical observations, patient history, and epidemiological information. The expected result is Negative.  Fact Sheet for Patients: 08/18/20  Fact Sheet for Healthcare Providers: HairSlick.no  This test is not yet approved or cleared by the [place] FDA and  has been authorized for detection and/or diagnosis  of SARS-CoV-2 by FDA under an Emergency Use Authorization (EUA). This EUA will remain  in effect (meaning this test can be used) for the duration of the COVID-19 declaration under Se ction 564(b)(1) of the Act, 21 U.S.C. section 360bbb-3(b)(1), unless the authorization is terminated or revoked sooner.  Performed at [facility], [address], [place], [place]   Culture, blood (routine x 2)     Status: None (Preliminary result)   Collection Time: 08/16/20  9:55 PM  Specimen: BLOOD  Result Value Ref Range Status   Specimen Description BLOOD RIGHT ANTECUBITAL  Final   Special Requests   Final    BOTTLES DRAWN AEROBIC AND ANAEROBIC Blood Culture adequate volume   Culture   Final    NO GROWTH 2 DAYS Performed at [facility], [address] [place], [place] [address]    Report Status PENDING  Incomplete  Culture, blood (routine x 2)     Status: None (Preliminary result)   Collection Time: 08/16/20 10:05 PM   Specimen: BLOOD RIGHT HAND  Result Value Ref Range Status   Specimen Description BLOOD RIGHT HAND  Final   Special Requests   Final    BOTTLES DRAWN AEROBIC AND ANAEROBIC Blood Culture results may not be optimal due to an excessive volume of blood received in culture bottles   Culture   Final    NO GROWTH 2 DAYS Performed at [facility], [address] [place], [place] [address]    Report Status PENDING  Incomplete  MRSA PCR Screening     Status: None   Collection Time: 08/17/20  5:36 PM   Specimen: Nasal Mucosa; Nasopharyngeal  Result Value Ref Range Status   MRSA by PCR NEGATIVE NEGATIVE Final    Comment:        The GeneXpert MRSA Assay (FDA approved for NASAL specimens only), is one component of a comprehensive MRSA colonization surveillance program. It is not intended to diagnose MRSA infection nor to guide or monitor treatment for MRSA infections. Performed at [facility], [address] [place], [place] [address]      RADIOLOGY STUDIES/RESULTS: No results found.   LOS: 2 days   [name], MD  [facility]    To contact the attending provider between 7A-7P or the covering provider during after hours 7P-7A, please log into the web site [email or web address] and access using universal [place] password for that web site. If you do not have the password, please call the hospital operator.  08/19/2020, 2:14 PM

## 2020-08-20 DIAGNOSIS — L03319 Cellulitis of trunk, unspecified: Secondary | ICD-10-CM | POA: Diagnosis not present

## 2020-08-20 MED ORDER — GLYCOPYRROLATE 0.2 MG/ML IJ SOLN
0.1000 mg | INTRAMUSCULAR | Status: DC | PRN
  Filled 2020-08-20: qty 1

## 2020-08-20 MED ORDER — GLYCOPYRROLATE 0.2 MG/ML IJ SOLN: 0.1000 mg | INTRAMUSCULAR | Status: DC | PRN

## 2020-08-20 NOTE — Progress Notes (Signed)
PROGRESS NOTE        PATIENT DETAILS Name: Ruben Walker Age: 80 y.o. Sex: male Date of Birth: [birth year] Admit Date: 08/16/2020 Admitting Physician Dewayne Shorter Levora Dredge, MD [id number], Maryjean Morn, MD  Brief Narrative: Patient is a 80 y.o. male chronic respiratory failure on 2 L of oxygen at home, OSA, chronic atrial fibrillation, chronic diastolic heart failure, PPM/AICD placement, history of complete heart block-sent from SNF for worsening sacral wound and acute metabolic encephalopathy.  Significant events: 3/29>> admit for worsening sacral wound/acute metabolic encephalopathy. 3/31>> transition to comfort care  Significant studies: 3/29>> CT head: No acute intracranial abnormality. 3/29>> chest x-ray: No pneumonia  Antimicrobial therapy: Vancomycin: 3/29>> 3/31 Cefepime: 3/29>> 3/31 Flagyl: 3/29 x 1  Microbiology data: 3/29>> blood culture: No growth  Procedures : None  Consults: General surgery, palliative care  DVT Prophylaxis : Supratherapeutic INR>> now transition to comfort care-does not require prophylaxis.   Subjective: Lying comfortably in bed-no major issues overnight.   Assessment/Plan: Sepsis due to sacral wound infection- stage IV sacral decubitus ulcer: Initially started on broad-spectrum antimicrobial therapy-wound care-General surgery was consulted.  Given overall poor prognosis-palliative care was consulted-after discussion with family-patient was transitioned to full comfort measures on 3/31.  Oral antibiotics-hydrotherapy has been discontinued-continue palliative wound care.   Acute metabolic encephalopathy: Due to sepsis/AKI-improved compared to admission-unclear what his baseline is-he is still somewhat confused.  AKI: Likely hemodynamically mediated-improved with supportive care.  No further lab draws as patient full comfort measures.  Hyperkalemia: Due to [facility] with Lokelma.  Hyponatremia: Mild-probably due  to hypovolemia-improved with IVF.  Normocytic anemia: Probably due to chronic sacral wound-worsened due to AKI/chronic bleeding from wound.  Received 1 unit of PRBC.  Supratherapeutic INR: Coumadin was held-vitamin K was given.  No further INR checks as patient on comfort status-no longer on Coumadin.  Permanent atrial fibrillation: Initially on beta-blocker-INR supratherapeutic-currently transition to full comfort measures-no longer on beta-blocker-Coumadin discontinued.  Chronic diastolic heart failure: Continues to have lower extremity edema which is suspected to be mostly from hypoalbuminemia  HLD: Continue statin  GERD: Continue PPI  Chronic hypoxic respiratory failure-on home O2  Chronic indwelling Foley catheter: Being continued-as patient on comfort care.  Unclear why patient has a chronic indwelling Foley catheter  Severe malnutrition   Goals of care: DNR in place-very poor prognosis-this MD discussed with family on 3/31-palliative care also discussed with family-subsequently transition to full comfort measures.  Awaiting residential hospice bed.   Obesity: Estimated body mass index is 35.12 kg/m as calculated from the following:   Height as of 02/22/20: 5\' 2"  (1.575 m).   Weight as of 03/28/20: 87.1 kg.    Diet: Diet Order            Diet regular Room service appropriate? No; Fluid consistency: Thin  Diet effective now                  Code Status: DNR  Family Communication: Nephew-Dennis- [phone] over the phone on 3/31  Disposition Plan: Status is: Inpatient  The patient will require care spanning > 2 midnights and should be moved to inpatient because: Inpatient level of care appropriate due to severity of illness  Dispo: The patient is from: SNF              Anticipated d/c is to: SNF  Patient currently is medically stable to d/c.   Difficult to place patient No   Barriers to Discharge: Awaiting residential hospice  bed  Antimicrobial agents: Anti-infectives (From admission, onward)   Start     Dose/Rate Route Frequency Ordered Stop   08/18/20 2300  vancomycin (VANCOREADY) IVPB 1250 mg/250 mL  Status:  Discontinued        1,250 mg 166.7 mL/hr over 90 Minutes Intravenous Every 48 hours 08/17/20 0902 08/18/20 1428   08/17/20 2300  ceFEPIme (MAXIPIME) 2 g in sodium chloride 0.9 % 100 mL IVPB  Status:  Discontinued        2 g 200 mL/hr over 30 Minutes Intravenous Every 24 hours 08/16/20 2215 08/17/20 0902   08/17/20 1030  ceFEPIme (MAXIPIME) 2 g in sodium chloride 0.9 % 100 mL IVPB  Status:  Discontinued        2 g 200 mL/hr over 30 Minutes Intravenous Every 12 hours 08/17/20 0902 08/18/20 1428   08/17/20 0200  metroNIDAZOLE (FLAGYL) IVPB 500 mg  Status:  Discontinued        500 mg 100 mL/hr over 60 Minutes Intravenous Every 8 hours 08/17/20 0132 08/17/20 0931   08/16/20 2214  vancomycin variable dose per unstable renal function (pharmacist dosing)  Status:  Discontinued         Does not apply See admin instructions 08/16/20 2215 08/17/20 0902   08/16/20 2200  ceFEPIme (MAXIPIME) 2 g in sodium chloride 0.9 % 100 mL IVPB        2 g 200 mL/hr over 30 Minutes Intravenous  Once 08/16/20 2148 08/16/20 2300   08/16/20 2200  vancomycin (VANCOCIN) 1,750 mg in sodium chloride 0.9 % 500 mL IVPB        1,750 mg 250 mL/hr over 120 Minutes Intravenous  Once 08/16/20 2148 08/17/20 0134       Time spent: 35 minutes-Greater than 50% of this time was spent in counseling, explanation of diagnosis, planning of further management, and coordination of care.  MEDICATIONS: Scheduled Meds: . Chlorhexidine Gluconate Cloth  6 each Topical Daily  . collagenase   Topical Daily  . feeding supplement  237 mL Oral TID BM   Continuous Infusions: . sodium chloride     PRN Meds:.acetaminophen **OR** acetaminophen, bisacodyl, glycopyrrolate, LORazepam, morphine CONCENTRATE, ondansetron **OR** ondansetron (ZOFRAN)  IV   PHYSICAL EXAM: Vital signs: Vitals:   08/18/20 1810 08/18/20 2100 08/19/20 0800 08/20/20 0341  BP:  (!) 101/49 (!) 99/48 (!) 161/141  Pulse: 71 75 70 72  Resp: (!) 23 19 19 17   Temp:  97.6 F (36.4 C) (!) 97.5 F (36.4 C) 98 F (36.7 C)  TempSrc:  Oral Axillary Oral  SpO2: 99% 99% 100% 99%   There were no vitals filed for this visit. There is no height or weight on file to calculate BMI.   Gen Exam: Somewhat alert-not in any distress-confused. HEENT:atraumatic, normocephalic Chest: B/L clear to auscultation anteriorly CVS:S1S2 regular Abdomen:soft non tender, non distended Extremities:no edema Neurology: Generalized weakness but moving all 4 extremities. Skin: no rash  I have personally reviewed following labs and imaging studies  LABORATORY DATA: CBC: Recent Labs  Lab 08/16/20 1835 08/17/20 0340 08/18/20 0139  WBC 17.9* 17.6* 15.5*  NEUTROABS 12.4*  --   --   HGB 6.7* 7.3* 7.2*  HCT 21.7* 24.1* 23.1*  MCV 82.8 89.9 84.3  PLT 589* 498* 506*    Basic Metabolic Panel: Recent Labs  Lab 08/16/20 1835 08/17/20 0340 08/18/20 0139  NA 131* 133* 135  K 4.9 5.4* 4.4  CL 100 101 107  CO2 25 21* 20*  GLUCOSE 75 122* 80  BUN 48* 42* 39*  CREATININE 1.76* 1.66* 1.48*  CALCIUM 9.9 9.2 9.9  MG 1.7 1.5*  --     GFR: CrCl cannot be calculated (Unknown ideal weight.).  Liver Function Tests: Recent Labs  Lab 08/16/20 1835 08/17/20 0340 08/18/20 0139  AST 28 26 32  ALT 22 17 18   ALKPHOS 79 67 64  BILITOT 0.6 0.8 1.2  PROT 5.1* 4.6* 4.8*  ALBUMIN 1.4* 1.3* 1.4*   No results for input(s): LIPASE, AMYLASE in the last 168 hours. Recent Labs  Lab 08/17/20 0903  AMMONIA 22    Coagulation Profile: Recent Labs  Lab 08/16/20 1835 08/17/20 0340 08/18/20 0139  INR 5.2* 4.6* 5.9*    Cardiac Enzymes: No results for input(s): CKTOTAL, CKMB, CKMBINDEX, TROPONINI in the last 168 hours.  BNP (last 3 results) No results for input(s): PROBNP in the  last 8760 hours.  Lipid Profile: No results for input(s): CHOL, HDL, LDLCALC, TRIG, CHOLHDL, LDLDIRECT in the last 72 hours.  Thyroid Function Tests: No results for input(s): TSH, T4TOTAL, FREET4, T3FREE, THYROIDAB in the last 72 hours.  Anemia Panel: No results for input(s): VITAMINB12, FOLATE, FERRITIN, TIBC, IRON, RETICCTPCT in the last 72 hours.  Urine analysis:    Component Value Date/Time   COLORURINE YELLOW 08/16/2020 1643   APPEARANCEUR CLEAR 08/16/2020 1643   LABSPEC 1.009 08/16/2020 1643   PHURINE 7.0 08/16/2020 1643   GLUCOSEU NEGATIVE 08/16/2020 1643   HGBUR NEGATIVE 08/16/2020 1643   BILIRUBINUR NEGATIVE 08/16/2020 1643   KETONESUR NEGATIVE 08/16/2020 1643   PROTEINUR NEGATIVE 08/16/2020 1643   NITRITE NEGATIVE 08/16/2020 1643   LEUKOCYTESUR NEGATIVE 08/16/2020 1643    Sepsis Labs: Lactic Acid, Venous    Component Value Date/Time   LATICACIDVEN 1.9 08/17/2020 0340    MICROBIOLOGY: Recent Results (from the past 240 hour(s))  SARS CORONAVIRUS 2 (TAT 6-24 HRS) Nasopharyngeal Nasopharyngeal Swab     Status: None   Collection Time: 08/16/20  9:34 PM   Specimen: Nasopharyngeal Swab  Result Value Ref Range Status   SARS Coronavirus 2 NEGATIVE NEGATIVE Final    Comment: (NOTE) SARS-CoV-2 target nucleic acids are NOT DETECTED.  The SARS-CoV-2 RNA is generally detectable in upper and lower respiratory specimens during the acute phase of infection. Negative results do not preclude SARS-CoV-2 infection, do not rule out co-infections with other pathogens, and should not be used as the sole basis for treatment or other patient management decisions. Negative results must be combined with clinical observations, patient history, and epidemiological information. The expected result is Negative.  Fact Sheet for Patients: HairSlick.no  Fact Sheet for Healthcare Providers: quierodirigir.com  This test is not yet  approved or cleared by the [place] FDA and  has been authorized for detection and/or diagnosis of SARS-CoV-2 by FDA under an Emergency Use Authorization (EUA). This EUA will remain  in effect (meaning this test can be used) for the duration of the COVID-19 declaration under Se ction 564(b)(1) of the Act, 21 U.S.C. section 360bbb-3(b)(1), unless the authorization is terminated or revoked sooner.  Performed at [facility], [address] [place], [place] [address]   Culture, blood (routine x 2)     Status: None (Preliminary result)   Collection Time: 08/16/20  9:55 PM   Specimen: BLOOD  Result Value Ref Range Status   Specimen Description BLOOD RIGHT ANTECUBITAL  Final   Special Requests   Final    BOTTLES DRAWN AEROBIC AND ANAEROBIC Blood Culture adequate volume   Culture   Final    NO GROWTH 4 DAYS Performed at [facility], [address], [place], [place] [address]    Report Status PENDING  Incomplete  Culture, blood (routine x 2)     Status: None (Preliminary result)   Collection Time: 08/16/20 10:05 PM   Specimen: BLOOD RIGHT HAND  Result Value Ref Range Status   Specimen Description BLOOD RIGHT HAND  Final   Special Requests   Final    BOTTLES DRAWN AEROBIC AND ANAEROBIC Blood Culture results may not be optimal due to an excessive volume of blood received in culture bottles   Culture   Final    NO GROWTH 4 DAYS Performed at [facility], [address] [place], [place] [address]    Report Status PENDING  Incomplete  MRSA PCR Screening     Status: None   Collection Time: 08/17/20  5:36 PM   Specimen: Nasal Mucosa; Nasopharyngeal  Result Value Ref Range Status   MRSA by PCR NEGATIVE NEGATIVE Final    Comment:        The GeneXpert MRSA Assay (FDA approved for NASAL specimens only), is one component of a comprehensive MRSA colonization surveillance program. It is not intended to diagnose MRSA infection nor to guide or monitor treatment  for MRSA infections. Performed at [facility], [address], [place], [place] [address]     RADIOLOGY STUDIES/RESULTS: No results found.   LOS: 3 days   [name], MD  [facility]    To contact the attending provider between 7A-7P or the covering provider during after hours 7P-7A, please log into the web site [email or web address] and access using universal [place] password for that web site. If you do not have the password, please call the hospital operator.  08/20/2020, 11:35 AM

## 2020-08-21 DIAGNOSIS — L03319 Cellulitis of trunk, unspecified: Secondary | ICD-10-CM | POA: Diagnosis not present

## 2020-08-21 LAB — CULTURE, BLOOD (ROUTINE X 2)
Culture: NO GROWTH
Culture: NO GROWTH
Special Requests: ADEQUATE

## 2020-08-21 NOTE — TOC Progression Note (Signed)
Transition of Care [facility]) - Progression Note    Patient Details  Name: Ruben Walker MRN: [id number] Date of Birth: 02-Aug-1940  Transition of Care [facility]) CM/SW Contact  Carmina Miller, LCSWA Phone Number: 08/21/2020, 1:25 PM  Clinical Narrative:    CSW reached out to [employer], [facility], no beds available today.    Expected Discharge Plan: Hospice Medical Facility Barriers to Discharge: Hospice Bed not available  Expected Discharge Plan and Services Expected Discharge Plan: Hospice Medical Facility In-house Referral: Clinical Social Work   Post Acute Care Choice: Hospice Living arrangements for the past 2 months: Skilled Nursing Facility                             [facility] Agency: [facility] Date [facility] Agency Contacted: 08/19/20 Time HH Agency Contacted: [phone] Representative spoke with at [facility] Agency: Cheri   Social Determinants of Health (SDOH) Interventions    Readmission Risk Interventions No flowsheet data found.

## 2020-08-21 NOTE — Progress Notes (Signed)
SLP Cancellation Note  Patient Details Name: Ruben Walker MRN: [id number] DOB: [birth year]   Cancelled treatment:       Reason Eval/Treat Not Completed: Other (comment) (Pt has been transitioned to comfort care. SLP will sign off.)  Krystena Reitter I. Vear Clock, MS, CCC-SLP Acute Rehabilitation Services Office number [phone] Pager [phone]  Scheryl Marten 08/21/2020, 10:31 AM

## 2020-08-21 NOTE — Progress Notes (Signed)
PROGRESS NOTE        PATIENT DETAILS Name: Ruben Walker Age: 80 y.o. Sex: male Date of Birth: 1940/12/04 Admit Date: 08/16/2020 Admitting Physician Dewayne Shorter Levora Dredge, MD VVO:[id number], Maryjean Morn, MD  Brief Narrative: Patient is a 80 y.o. male chronic respiratory failure on 2 L of oxygen at home, OSA, chronic atrial fibrillation, chronic diastolic heart failure, PPM/AICD placement, history of complete heart block-sent from SNF for worsening sacral wound and acute metabolic encephalopathy.  Significant events: 3/29>> admit for worsening sacral wound/acute metabolic encephalopathy. 3/31>> transition to comfort care  Significant studies: 3/29>> CT head: No acute intracranial abnormality. 3/29>> chest x-ray: No pneumonia  Antimicrobial therapy: Vancomycin: 3/29>> 3/31 Cefepime: 3/29>> 3/31 Flagyl: 3/29 x 1  Microbiology data: 3/29>> blood culture: No growth  Procedures : None  Consults: General surgery, palliative care  DVT Prophylaxis : Supratherapeutic INR>> now transition to comfort care-does not require prophylaxis.   Subjective: Lying comfortably in bed-no major issues overnight.  Awaiting residential hospice bed.   Assessment/Plan: Sepsis due to sacral wound infection- stage IV sacral decubitus ulcer: Initially started on broad-spectrum antimicrobial therapy-wound care-General surgery was consulted.  Given overall poor prognosis-palliative care was consulted-after discussion with family-patient was transitioned to full comfort measures on 3/31.  Oral antibiotics-hydrotherapy has been discontinued-continue palliative wound care.   Acute metabolic encephalopathy: Due to sepsis/AKI-improved compared to admission-unclear what his baseline is-he is still somewhat confused.  AKI: Likely hemodynamically mediated-improved with supportive care.  No further lab draws as patient full comfort measures.  Hyperkalemia: Due to [facility] with  Lokelma.  Hyponatremia: Mild-probably due to hypovolemia-improved with IVF.  Normocytic anemia: Probably due to chronic sacral wound-worsened due to AKI/chronic bleeding from wound.  Received 1 unit of PRBC.  Supratherapeutic INR: Coumadin was held-vitamin K was given.  No further INR checks as patient on comfort status-no longer on Coumadin.  Permanent atrial fibrillation: Initially on beta-blocker-INR supratherapeutic-currently transition to full comfort measures-no longer on beta-blocker-Coumadin discontinued.  Chronic diastolic heart failure: Continues to have lower extremity edema which is suspected to be mostly from hypoalbuminemia  HLD: Continue statin  GERD: Continue PPI  Chronic hypoxic respiratory failure-on home O2  Chronic indwelling Foley catheter: Being continued-as patient on comfort care.  Unclear why patient has a chronic indwelling Foley catheter  Severe malnutrition   Goals of care: DNR in place-very poor prognosis-this MD discussed with family on 3/31-palliative care also discussed with family-subsequently transition to full comfort measures.  Awaiting residential hospice bed.   Obesity: Estimated body mass index is 35.12 kg/m as calculated from the following:   Height as of 02/22/20: 5\' 2"  (1.575 m).   Weight as of 03/28/20: 87.1 kg.    Diet: Diet Order            Diet regular Room service appropriate? No; Fluid consistency: Thin  Diet effective now                  Code Status: DNR  Family Communication: Nephew-Dennis- [phone] over the phone on 3/31  Disposition Plan: Status is: Inpatient  The patient will require care spanning > 2 midnights and should be moved to inpatient because: Inpatient level of care appropriate due to severity of illness  Dispo: The patient is from: SNF              Anticipated d/c is to: SNF  Patient currently is medically stable to d/c.   Difficult to place patient No   Barriers to Discharge:  Awaiting residential hospice bed  Antimicrobial agents: Anti-infectives (From admission, onward)   Start     Dose/Rate Route Frequency Ordered Stop   08/18/20 2300  vancomycin (VANCOREADY) IVPB 1250 mg/250 mL  Status:  Discontinued        1,250 mg 166.7 mL/hr over 90 Minutes Intravenous Every 48 hours 08/17/20 0902 08/18/20 1428   08/17/20 2300  ceFEPIme (MAXIPIME) 2 g in sodium chloride 0.9 % 100 mL IVPB  Status:  Discontinued        2 g 200 mL/hr over 30 Minutes Intravenous Every 24 hours 08/16/20 2215 08/17/20 0902   08/17/20 1030  ceFEPIme (MAXIPIME) 2 g in sodium chloride 0.9 % 100 mL IVPB  Status:  Discontinued        2 g 200 mL/hr over 30 Minutes Intravenous Every 12 hours 08/17/20 0902 08/18/20 1428   08/17/20 0200  metroNIDAZOLE (FLAGYL) IVPB 500 mg  Status:  Discontinued        500 mg 100 mL/hr over 60 Minutes Intravenous Every 8 hours 08/17/20 0132 08/17/20 0931   08/16/20 2214  vancomycin variable dose per unstable renal function (pharmacist dosing)  Status:  Discontinued         Does not apply See admin instructions 08/16/20 2215 08/17/20 0902   08/16/20 2200  ceFEPIme (MAXIPIME) 2 g in sodium chloride 0.9 % 100 mL IVPB        2 g 200 mL/hr over 30 Minutes Intravenous  Once 08/16/20 2148 08/16/20 2300   08/16/20 2200  vancomycin (VANCOCIN) 1,750 mg in sodium chloride 0.9 % 500 mL IVPB        1,750 mg 250 mL/hr over 120 Minutes Intravenous  Once 08/16/20 2148 08/17/20 0134       Time spent: 35 minutes-Greater than 50% of this time was spent in counseling, explanation of diagnosis, planning of further management, and coordination of care.  MEDICATIONS: Scheduled Meds: . Chlorhexidine Gluconate Cloth  6 each Topical Daily  . collagenase   Topical Daily  . feeding supplement  237 mL Oral TID BM   Continuous Infusions: . sodium chloride     PRN Meds:.acetaminophen **OR** acetaminophen, bisacodyl, glycopyrrolate, LORazepam, morphine CONCENTRATE, ondansetron **OR**  ondansetron (ZOFRAN) IV   PHYSICAL EXAM: Vital signs: Vitals:   08/19/20 0800 08/20/20 0341 08/20/20 1457 08/21/20 0500  BP: (!) 99/48 (!) 161/141 (!) 108/47 (!) 115/49  Pulse: 70 72 89 70  Resp: 19 17 (!) 35 (!) 21  Temp: (!) 97.5 F (36.4 C) 98 F (36.7 C) 98.7 F (37.1 C) (!) 97.5 F (36.4 C)  TempSrc: Axillary Oral Oral Axillary  SpO2: 100% 99% (!) 77% 98%   There were no vitals filed for this visit. There is no height or weight on file to calculate BMI.   Gen Exam: Somewhat alert-not in any distress-confused. HEENT:atraumatic, normocephalic Chest: B/L clear to auscultation anteriorly CVS:S1S2 regular Abdomen:soft non tender, non distended Extremities:no edema Neurology: Generalized weakness but moving all 4 extremities. Skin: no rash  I have personally reviewed following labs and imaging studies  LABORATORY DATA: CBC: Recent Labs  Lab 08/16/20 1835 08/17/20 0340 08/18/20 0139  WBC 17.9* 17.6* 15.5*  NEUTROABS 12.4*  --   --   HGB 6.7* 7.3* 7.2*  HCT 21.7* 24.1* 23.1*  MCV 82.8 89.9 84.3  PLT 589* 498* 506*    Basic Metabolic Panel: Recent Labs  Lab  08/16/20 1835 08/17/20 0340 08/18/20 0139  NA 131* 133* 135  K 4.9 5.4* 4.4  CL 100 101 107  CO2 25 21* 20*  GLUCOSE 75 122* 80  BUN 48* 42* 39*  CREATININE 1.76* 1.66* 1.48*  CALCIUM 9.9 9.2 9.9  MG 1.7 1.5*  --     GFR: CrCl cannot be calculated (Unknown ideal weight.).  Liver Function Tests: Recent Labs  Lab 08/16/20 1835 08/17/20 0340 08/18/20 0139  AST 28 26 32  ALT 22 17 18   ALKPHOS 79 67 64  BILITOT 0.6 0.8 1.2  PROT 5.1* 4.6* 4.8*  ALBUMIN 1.4* 1.3* 1.4*   No results for input(s): LIPASE, AMYLASE in the last 168 hours. Recent Labs  Lab 08/17/20 0903  AMMONIA 22    Coagulation Profile: Recent Labs  Lab 08/16/20 1835 08/17/20 0340 08/18/20 0139  INR 5.2* 4.6* 5.9*    Cardiac Enzymes: No results for input(s): CKTOTAL, CKMB, CKMBINDEX, TROPONINI in the last 168  hours.  BNP (last 3 results) No results for input(s): PROBNP in the last 8760 hours.  Lipid Profile: No results for input(s): CHOL, HDL, LDLCALC, TRIG, CHOLHDL, LDLDIRECT in the last 72 hours.  Thyroid Function Tests: No results for input(s): TSH, T4TOTAL, FREET4, T3FREE, THYROIDAB in the last 72 hours.  Anemia Panel: No results for input(s): VITAMINB12, FOLATE, FERRITIN, TIBC, IRON, RETICCTPCT in the last 72 hours.  Urine analysis:    Component Value Date/Time   COLORURINE YELLOW 08/16/2020 1643   APPEARANCEUR CLEAR 08/16/2020 1643   LABSPEC 1.009 08/16/2020 1643   PHURINE 7.0 08/16/2020 1643   GLUCOSEU NEGATIVE 08/16/2020 1643   HGBUR NEGATIVE 08/16/2020 1643   BILIRUBINUR NEGATIVE 08/16/2020 1643   KETONESUR NEGATIVE 08/16/2020 1643   PROTEINUR NEGATIVE 08/16/2020 1643   NITRITE NEGATIVE 08/16/2020 1643   LEUKOCYTESUR NEGATIVE 08/16/2020 1643    Sepsis Labs: Lactic Acid, Venous    Component Value Date/Time   LATICACIDVEN 1.9 08/17/2020 0340    MICROBIOLOGY: Recent Results (from the past 240 hour(s))  SARS CORONAVIRUS 2 (TAT 6-24 HRS) Nasopharyngeal Nasopharyngeal Swab     Status: None   Collection Time: 08/16/20  9:34 PM   Specimen: Nasopharyngeal Swab  Result Value Ref Range Status   SARS Coronavirus 2 NEGATIVE NEGATIVE Final    Comment: (NOTE) SARS-CoV-2 target nucleic acids are NOT DETECTED.  The SARS-CoV-2 RNA is generally detectable in upper and lower respiratory specimens during the acute phase of infection. Negative results do not preclude SARS-CoV-2 infection, do not rule out co-infections with other pathogens, and should not be used as the sole basis for treatment or other patient management decisions. Negative results must be combined with clinical observations, patient history, and epidemiological information. The expected result is Negative.  Fact Sheet for Patients: 08/18/20  Fact Sheet for Healthcare  Providers: HairSlick.no  This test is not yet approved or cleared by the quierodirigir.com FDA and  has been authorized for detection and/or diagnosis of SARS-CoV-2 by FDA under an Emergency Use Authorization (EUA). This EUA will remain  in effect (meaning this test can be used) for the duration of the COVID-19 declaration under Se ction 564(b)(1) of the Act, 21 U.S.C. section 360bbb-3(b)(1), unless the authorization is terminated or revoked sooner.  Performed at [facility], [address] [place], [place]   Culture, blood (routine x 2)     Status: None (Preliminary result)   Collection Time: 08/16/20  9:55 PM   Specimen: BLOOD  Result Value Ref Range Status  Specimen Description BLOOD RIGHT ANTECUBITAL  Final   Special Requests   Final    BOTTLES DRAWN AEROBIC AND ANAEROBIC Blood Culture adequate volume   Culture   Final    NO GROWTH 4 DAYS Performed at [facility], [address], [place], [place] [address]    Report Status PENDING  Incomplete  Culture, blood (routine x 2)     Status: None (Preliminary result)   Collection Time: 08/16/20 10:05 PM   Specimen: BLOOD RIGHT HAND  Result Value Ref Range Status   Specimen Description BLOOD RIGHT HAND  Final   Special Requests   Final    BOTTLES DRAWN AEROBIC AND ANAEROBIC Blood Culture results may not be optimal due to an excessive volume of blood received in culture bottles   Culture   Final    NO GROWTH 4 DAYS Performed at [facility], [address] [place], [place] [address]    Report Status PENDING  Incomplete  MRSA PCR Screening     Status: None   Collection Time: 08/17/20  5:36 PM   Specimen: Nasal Mucosa; Nasopharyngeal  Result Value Ref Range Status   MRSA by PCR NEGATIVE NEGATIVE Final    Comment:        The GeneXpert MRSA Assay (FDA approved for NASAL specimens only), is one component of a comprehensive MRSA colonization surveillance program. It is  not intended to diagnose MRSA infection nor to guide or monitor treatment for MRSA infections. Performed at [facility], [address] [place], [place] [address]     RADIOLOGY STUDIES/RESULTS: No results found.   LOS: 4 days   [name], MD  [facility]    To contact the attending provider between 7A-7P or the covering provider during after hours 7P-7A, please log into the web site [email or web address] and access using universal [place] password for that web site. If you do not have the password, please call the hospital operator.  08/21/2020, 10:58 AM

## 2020-08-22 DIAGNOSIS — I5032 Chronic diastolic (congestive) heart failure: Secondary | ICD-10-CM | POA: Diagnosis not present

## 2020-08-22 DIAGNOSIS — D62 Acute posthemorrhagic anemia: Secondary | ICD-10-CM | POA: Diagnosis not present

## 2020-08-22 DIAGNOSIS — L03319 Cellulitis of trunk, unspecified: Secondary | ICD-10-CM | POA: Diagnosis not present

## 2020-08-22 DIAGNOSIS — G9341 Metabolic encephalopathy: Secondary | ICD-10-CM | POA: Diagnosis not present

## 2020-08-22 MED ORDER — LORAZEPAM 2 MG/ML IJ SOLN: 1.0000 mg | INTRAMUSCULAR | 0 refills | Status: AC | PRN

## 2020-08-22 MED ORDER — GLYCOPYRROLATE 0.2 MG/ML IJ SOLN: 0.1000 mg | INTRAMUSCULAR | Status: AC | PRN

## 2020-08-22 MED ORDER — MORPHINE SULFATE (CONCENTRATE) 10 MG/0.5ML PO SOLN: 5.0000 mg | ORAL | 0 refills | Status: AC | PRN

## 2020-08-22 MED ORDER — COLLAGENASE 250 UNIT/GM EX OINT: TOPICAL_OINTMENT | Freq: Every day | CUTANEOUS | 0 refills | Status: AC

## 2020-08-22 NOTE — TOC Transition Note (Signed)
Transition of Care [facility]) - CM/SW Discharge Note   Patient Details  Name: Ruben Walker MRN: [id number] Date of Birth: [birth year]  Transition of Care [facility]) CM/SW Contact:  Mearl Latin, LCSW Phone Number: 08/22/2020, 2:31 PM   Clinical Narrative:    [facility] to[facility] Anticipated DC date: 08/22/20 Family notified: [facility] by: Sharin Mons   Per MD patient ready for DC to [facility]. RN to call report prior to discharge [phone]). RN, patient, patient's family, and facility notified of DC. Discharge Summary sent to facility. RN confirmed AICD has been turned off. DC packet on chart. Ambulance transport requested for patient.   CSW will sign off for now as social work intervention is no longer needed. Please consult [place] again if new needs arise.      Final next level of care: Hospice Medical Facility Barriers to Discharge: Barriers Resolved   Patient Goals and CMS Choice Patient states their goals for this hospitalization and ongoing recovery are:: comfort CMS Medicare.gov Compare Post Acute Care list provided to:: Patient Represenative (must comment) Choice offered to / list presented to : Sibling  Discharge Placement              [facility] at:  ([facility]) Patient to be transferred to facility by: [facility] Name of family member notified: Maurine Minister, nephew, Patient and family notified of of transfer: 08/22/20  Discharge Plan and Services In-house Referral: Clinical Social Work   Post Acute Care Choice: Hospice                      HH Agency: [facility] Date HH Agency Contacted: 08/19/20 Time HH Agency Contacted: [phone] Representative spoke with at [facility] Agency: Cheri  Social Determinants of Health (SDOH) Interventions     Readmission Risk Interventions No flowsheet data found.

## 2020-08-22 NOTE — Progress Notes (Signed)
Palliative Care Progress Note  Medical records reviewed. Assessed patient at the bedside. No family is present. Patient is pleasantly confused and appears comfortable, taking small bites from his breakfast tray. He reports mild discomfort in his neck, sacral wound, and both hips. He states that has been turned and repositioned in bed several times this morning, which does help him feel comfortable for a while. He is also agreeable to receiving pain medication.    Patient tells me he enjoyed a visit from his sister Jenel Lucks, nephew Kathlene November, and another friend yesterday. He reflects on how nice it is to be able to spend time with loved ones. He states that he tried his best to eat his "supper" but he does not have an appetite. Reassured patient that he is free to eat only whatever he desires and enjoys. He requests two cups of ice water which I then provided.  Patient denies anxiety or distress. He is calm and states that he has been focusing on watching TV this morning. He does note that it has only been news stories and he prefers to watch nature programs. I switched the channel to [facility] which he very much appreciated.  Discussed with RN Marcelino Duster. She informs me that patient will be discharging today.  Questions and concerns addressed. PMT will continue to support holistically.   Total time: 25 minutes Greater than 50% of this time was spent in counseling and coordinating of care related to the above assessment and plan.   Richardson Dopp, PA-C Palliative Medicine Team Team phone # [phone]  Thank you for allowing the Palliative Medicine Team to assist in the care of this patient. Please utilize secure chat with additional questions, if there is no response within 30 minutes please call the above phone number.  Palliative Medicine Team providers are available by phone from 7am to 7pm daily and can be reached through the team cell phone.  Should this patient require assistance  outside of these hours, please call the patient's attending physician.

## 2020-08-22 NOTE — Discharge Summary (Addendum)
PATIENT DETAILS Name: Ruben Walker Age: 80 y.o. Sex: male Date of Birth: February 17, 1941 MRN: [id number]. Admitting Physician: Maretta Bees, MD [id number], Maryjean Morn, MD  Admit Date: 08/16/2020 Discharge date: 08/22/2020  Recommendations for Outpatient Follow-up:  1. Optimize comfort care  Admitted From:  SNF  Disposition: Residential Hospice     Home Health: No  Equipment/Devices: None  Discharge Condition: Poor/hospice  CODE STATUS: DNR  Diet recommendation:  Diet Order            Diet - low sodium heart healthy           Diet regular Room service appropriate? No; Fluid consistency: Thin  Diet effective now                  Brief Narrative: Patient is a 80 y.o. male chronic respiratory failure on 2 L of oxygen at home, OSA, chronic atrial fibrillation, chronic diastolic heart failure, PPM/AICD placement, history of complete heart block-sent from SNF for worsening sacral wound and acute metabolic encephalopathy.  Significant events: 3/29>> admit for worsening sacral wound/acute metabolic encephalopathy. 3/31>> transition to comfort care  Significant studies: 3/29>> CT head: No acute intracranial abnormality. 3/29>> chest x-ray: No pneumonia  Antimicrobial therapy: Vancomycin: 3/29>> 3/31 Cefepime: 3/29>> 3/31 Flagyl: 3/29 x 1  Microbiology data: 3/29>> blood culture: No growth  Procedures : None  Consults: General surgery, palliative care   Brief Hospital Course: Severe Sepsis due to sacral wound infection- stage IV sacral decubitus ulcer: Initially started on broad-spectrum antimicrobial therapy-wound care-General surgery was consulted.  Given overall poor prognosis-palliative care was consulted-after discussion with family-patient was transitioned to full comfort measures on 3/31.    No longer on oral antibiotics-hydrotherapy has been discontinued-continue palliative wound care.   Acute metabolic encephalopathy: Due to  sepsis/AKI-improved compared to admission-unclear what his baseline is-he is still somewhat confused.  AKI: Likely hemodynamically mediated-improved with supportive care.  No further lab draws as patient full comfort measures.  Hyperkalemia: Due to [facility] with Lokelma.  Hyponatremia: Mild-probably due to hypovolemia-improved with IVF.  Normocytic anemia: Probably due to chronic sacral wound-worsened due to AKI/chronic bleeding from wound.  Received 1 unit of PRBC.  Supratherapeutic INR: Coumadin was held-vitamin K was given.  No further INR checks as patient on comfort status-no longer on Coumadin.  Permanent atrial fibrillation: Initially on beta-blocker-INR supratherapeutic-currently transition to full comfort measures-no longer on beta-blocker-Coumadin discontinued.  Chronic diastolic heart failure: Continues to have lower extremity edema which is suspected to be mostly from hypoalbuminemia  HLD: Continue statin  GERD: Continue PPI  Chronic hypoxic respiratory failure-on home O2  Chronic indwelling Foley catheter: Being continued-as patient on comfort care.  Unclear why patient has a chronic indwelling Foley catheter  Severe malnutrition   Goals of care: DNR in place-very poor prognosis-this MD discussed with family on 3/31-palliative care also discussed with family-subsequently transition to full comfort measures.  Awaiting residential hospice bed.  RN pressure injury documentation: Pressure Injury 07/20/20 Sacrum Unstageable - Full thickness tissue loss in which the base of the injury is covered by slough (yellow, tan, gray, green or brown) and/or eschar (tan, brown or black) in the wound bed. Tunnelling, gray eschar (Active)  07/20/20 0021  Location: Sacrum  Location Orientation:   Staging: Unstageable - Full thickness tissue loss in which the base of the injury is covered by slough (yellow, tan, gray, green or brown) and/or eschar (tan, brown or black) in  the wound bed.  Wound Description (Comments): Tunnelling, gray  eschar  Present on Admission: Yes     Pressure Injury 07/20/20 Buttocks Right Stage 2 -  Partial thickness loss of dermis presenting as a shallow open injury with a red, pink wound bed without slough. (Active)  07/20/20 0026  Location: Buttocks  Location Orientation: Right  Staging: Stage 2 -  Partial thickness loss of dermis presenting as a shallow open injury with a red, pink wound bed without slough.  Wound Description (Comments):   Present on Admission: Yes     Pressure Injury 07/20/20 Buttocks Left Stage 2 -  Partial thickness loss of dermis presenting as a shallow open injury with a red, pink wound bed without slough. (Active)  07/20/20 0027  Location: Buttocks  Location Orientation: Left  Staging: Stage 2 -  Partial thickness loss of dermis presenting as a shallow open injury with a red, pink wound bed without slough.  Wound Description (Comments):   Present on Admission: Yes     Pressure Injury 07/20/20 Heel Left Deep Tissue Pressure Injury - Purple or maroon localized area of discolored intact skin or blood-filled blister due to damage of underlying soft tissue from pressure and/or shear. (Active)  07/20/20 0032  Location: Heel  Location Orientation: Left  Staging: Deep Tissue Pressure Injury - Purple or maroon localized area of discolored intact skin or blood-filled blister due to damage of underlying soft tissue from pressure and/or shear.  Wound Description (Comments):   Present on Admission: Yes    Discharge Diagnoses:  Principal Problem:   Cellulitis of sacral region Active Problems:   Essential hypertension   Permanent atrial fibrillation (HCC)   Obstructive sleep apnea   AKI (acute kidney injury) (HCC)   Chronic diastolic heart failure (HCC)   Pressure injury of right heel, unstageable (HCC)   Acute metabolic encephalopathy   Pressure injury of sacral region, stage 4 (HCC)   Chronic respiratory  failure with hypoxia (HCC)   Failure to thrive in adult   End of life care   Pressure ulcer of right leg, stage 3 (HCC)   Supratherapeutic INR   Acute blood loss anemia   Sepsis due to cellulitis [facility])   Discharge Instructions:  Activity:  As tolerated   Discharge Instructions    Diet - low sodium heart healthy   Complete by: As directed    Discharge wound care:   Complete by: As directed    Cleanse with NS, pat gently dry. saline moistened roll gauze with Flagyl flacks top with dry gauze, ABD pads and secure with tape. Change PRN for soiling, otherwise twice daily.   Increase activity slowly   Complete by: As directed      Allergies as of 08/22/2020   No Known Allergies     Medication List    STOP taking these medications   (feeding supplement) PROSource Plus liquid   acetaminophen 325 MG tablet Commonly known as: TYLENOL   alum & mag hydroxide-simeth 200-200-20 MG/5ML suspension Commonly known as: MAALOX/MYLANTA   carvedilol 3.125 MG tablet Commonly known as: COREG   cholecalciferol 1000 units tablet Commonly known as: VITAMIN D   diclofenac Sodium 1 % Gel Commonly known as: VOLTAREN   feeding supplement (PRO-STAT SUGAR FREE 64) Liqd   feeding supplement Liqd   guaiFENesin 100 MG/5ML liquid Commonly known as: ROBITUSSIN   levocetirizine 5 MG tablet Commonly known as: XYZAL   losartan 100 MG tablet Commonly known as: COZAAR   multivitamin with minerals Tabs tablet   oxyCODONE 5 MG immediate release tablet  Commonly known as: Oxy IR/ROXICODONE   OXYGEN   pantoprazole 40 MG tablet Commonly known as: PROTONIX   rosuvastatin 5 MG tablet Commonly known as: CRESTOR   sucralfate 1 g tablet Commonly known as: CARAFATE   sulfamethoxazole-trimethoprim 800-160 MG tablet Commonly known as: BACTRIM DS   tamsulosin 0.4 MG Caps capsule Commonly known as: FLOMAX   thiamine 100 MG tablet   torsemide 20 MG tablet Commonly known as: DEMADEX    warfarin 5 MG tablet Commonly known as: COUMADIN     TAKE these medications   collagenase ointment Commonly known as: SANTYL Apply topically daily. Apply to sacral wound   glycopyrrolate 0.2 MG/ML injection Commonly known as: ROBINUL Take 0.5 mLs (0.1 mg total) by mouth every 4 (four) hours as needed (excessive secretions).   LORazepam 2 MG/ML injection Commonly known as: ATIVAN Inject 0.5 mLs (1 mg total) into the vein every 4 (four) hours as needed for anxiety.   morphine CONCENTRATE 10 MG/0.5ML Soln concentrated solution Take 0.25 mLs (5 mg total) by mouth every hour as needed for moderate pain, shortness of breath or severe pain.            Discharge Care Instructions  (From admission, onward)         Start     Ordered   08/22/20 0000  Discharge wound care:       Comments: Cleanse with NS, pat gently dry. saline moistened roll gauze with Flagyl flacks top with dry gauze, ABD pads and secure with tape. Change PRN for soiling, otherwise twice daily.   08/22/20 1033          Follow-up Information    McGowen, Maryjean Morn, MD Follow up.   Specialty: Family Medicine Why: as needed Contact information: [address] [place] [address] [phone]        Duke Salvia, MD Follow up.   Specialty: Cardiology Why: as needed Contact information: [address] [place] [address] [phone]              No Known Allergies   Other Procedures/Studies: CT Head Wo Contrast  Result Date: 08/16/2020 CLINICAL DATA:  Mental status change. EXAM: CT HEAD WITHOUT CONTRAST TECHNIQUE: Contiguous axial images were obtained from the base of the skull through the vertex without intravenous contrast. COMPARISON:  06/14/2020. FINDINGS: Brain: Remote right MCA territory are infarct with encephalomalacia, unchanged. No evidence of acute large vascular territory infarct. No acute hemorrhage. Similar generalized cerebral volume loss with ex  vacuo ventricular dilation. No hydrocephalus. No midline shift. Basal cisterns are patent. Vascular: Calcific atherosclerosis. No hyperdense vessel identified. Skull: No acute fracture. Sinuses/Orbits: Visualized sinuses are largely clear. Unremarkable visualized orbits. Other: No mastoid effusions. IMPRESSION: 1. No evidence of acute intracranial abnormality. 2. Remote right MCA territory infarct. Electronically Signed   By: Feliberto Harts MD   On: 08/16/2020 17:47   DG Lumbar Spine 1 View  Result Date: 08/16/2020 CLINICAL DATA:  Osteoarthritis of lumbar spine. EXAM: LUMBAR SPINE - 1 VIEW COMPARISON:  None. FINDINGS: Single frontal view of the lumbar spine. Normal alignment. Vertebral body heights are grossly normal. No severe disc space narrowing. No obvious fracture. No bony destruction visualized on this single view. IMPRESSION: Single frontal view of the lumbar spine without acute findings. No significant degenerative changes noted on AP view. Electronically Signed   By: Narda Rutherford M.D.   On: 08/16/2020 23:38   DG Chest Portable 1 View  Result  Date: 08/16/2020 CLINICAL DATA:  Change in mental status EXAM: PORTABLE CHEST 1 VIEW COMPARISON:  07/19/2020 FINDINGS: Single frontal view of the chest was obtained with the patient rotated to the left. Single lead AICD unchanged. Cardiac silhouette is stable. No airspace disease, effusion, or pneumothorax. IMPRESSION: 1. No acute intrathoracic process. Electronically Signed   By: Sharlet SalinaMichael  Brown M.D.   On: 08/16/2020 17:31     TODAY-DAY OF DISCHARGE:  Subjective:   Paiden Rickert remains pleasantly confused  Objective:   Blood pressure (!) 113/52, pulse 72, temperature 97.6 F (36.4 C), temperature source Axillary, resp. rate (!) 25, SpO2 99 %.  Intake/Output Summary (Last 24 hours) at 08/22/2020 1034 Last data filed at 08/22/2020 0300 Gross per 24 hour  Intake 720 ml  Output 300 ml  Net 420 ml   There were no vitals filed for this  visit.  Exam: Pleasantly confused [place].AT,PERRAL Supple Neck,No JVD, No cervical lymphadenopathy appriciated.  Symmetrical Chest wall movement, Good air movement bilaterally, CTAB RRR,No Gallops,Rubs or new Murmurs, No Parasternal Heave +ve B.Sounds, Abd Soft, Non tender, No organomegaly appriciated, No rebound -guarding or rigidity. No Cyanosis, Clubbing or edema, No new Rash or bruise   PERTINENT RADIOLOGIC STUDIES: No results found.   PERTINENT LAB RESULTS: CBC: No results for input(s): WBC, HGB, HCT, PLT in the last 72 hours. CMET CMP     Component Value Date/Time   NA 135 08/18/2020 0139   NA 139 03/09/2019 1307   K 4.4 08/18/2020 0139   CL 107 08/18/2020 0139   CO2 20 (L) 08/18/2020 0139   GLUCOSE 80 08/18/2020 0139   BUN 39 (H) 08/18/2020 0139   BUN 39 (H) 03/09/2019 1307   CREATININE 1.48 (H) 08/18/2020 0139   CREATININE 1.82 (H) 08/03/2016 1227   CALCIUM 9.9 08/18/2020 0139   PROT 4.8 (L) 08/18/2020 0139   PROT 6.6 03/09/2019 1307   ALBUMIN 1.4 (L) 08/18/2020 0139   ALBUMIN 4.6 03/09/2019 1307   AST 32 08/18/2020 0139   ALT 18 08/18/2020 0139   ALKPHOS 64 08/18/2020 0139   BILITOT 1.2 08/18/2020 0139   BILITOT 0.6 03/09/2019 1307   GFRNONAA 48 (L) 08/18/2020 0139   GFRAA 41 (L) 03/09/2019 1307    GFR CrCl cannot be calculated (Unknown ideal weight.). No results for input(s): LIPASE, AMYLASE in the last 72 hours. No results for input(s): CKTOTAL, CKMB, CKMBINDEX, TROPONINI in the last 72 hours. Invalid input(s): POCBNP No results for input(s): DDIMER in the last 72 hours. No results for input(s): HGBA1C in the last 72 hours. No results for input(s): CHOL, HDL, LDLCALC, TRIG, CHOLHDL, LDLDIRECT in the last 72 hours. No results for input(s): TSH, T4TOTAL, T3FREE, THYROIDAB in the last 72 hours.  Invalid input(s): FREET3 No results for input(s): VITAMINB12, FOLATE, FERRITIN, TIBC, IRON, RETICCTPCT in the last 72 hours. Coags: No results for input(s): INR  in the last 72 hours.  Invalid input(s): PT Microbiology: Recent Results (from the past 240 hour(s))  SARS CORONAVIRUS 2 (TAT 6-24 HRS) Nasopharyngeal Nasopharyngeal Swab     Status: None   Collection Time: 08/16/20  9:34 PM   Specimen: Nasopharyngeal Swab  Result Value Ref Range Status   SARS Coronavirus 2 NEGATIVE NEGATIVE Final    Comment: (NOTE) SARS-CoV-2 target nucleic acids are NOT DETECTED.  The SARS-CoV-2 RNA is generally detectable in upper and lower respiratory specimens during the acute phase of infection. Negative results do not preclude SARS-CoV-2 infection, do not rule out co-infections with other pathogens, and  should not be used as the sole basis for treatment or other patient management decisions. Negative results must be combined with clinical observations, patient history, and epidemiological information. The expected result is Negative.  Fact Sheet for Patients: HairSlick.no  Fact Sheet for Healthcare Providers: quierodirigir.com  This test is not yet approved or cleared by the [place] FDA and  has been authorized for detection and/or diagnosis of SARS-CoV-2 by FDA under an Emergency Use Authorization (EUA). This EUA will remain  in effect (meaning this test can be used) for the duration of the COVID-19 declaration under Se ction 564(b)(1) of the Act, 21 U.S.C. section 360bbb-3(b)(1), unless the authorization is terminated or revoked sooner.  Performed at [facility], [address], [place], [place] [address]   Culture, blood (routine x 2)     Status: None   Collection Time: 08/16/20  9:55 PM   Specimen: BLOOD  Result Value Ref Range Status   Specimen Description BLOOD RIGHT ANTECUBITAL  Final   Special Requests   Final    BOTTLES DRAWN AEROBIC AND ANAEROBIC Blood Culture adequate volume   Culture   Final    NO GROWTH 5 DAYS Performed at [facility], [name]  [place], [place] [address]    Report Status 08/21/2020 FINAL  Final  Culture, blood (routine x 2)     Status: None   Collection Time: 08/16/20 10:05 PM   Specimen: BLOOD RIGHT HAND  Result Value Ref Range Status   Specimen Description BLOOD RIGHT HAND  Final   Special Requests   Final    BOTTLES DRAWN AEROBIC AND ANAEROBIC Blood Culture results may not be optimal due to an excessive volume of blood received in culture bottles   Culture   Final    NO GROWTH 5 DAYS Performed at [facility], [address] [place], [place] [address]    Report Status 08/21/2020 FINAL  Final  MRSA PCR Screening     Status: None   Collection Time: 08/17/20  5:36 PM   Specimen: Nasal Mucosa; Nasopharyngeal  Result Value Ref Range Status   MRSA by PCR NEGATIVE NEGATIVE Final    Comment:        The GeneXpert MRSA Assay (FDA approved for NASAL specimens only), is one component of a comprehensive MRSA colonization surveillance program. It is not intended to diagnose MRSA infection nor to guide or monitor treatment for MRSA infections. Performed at [facility], [address] [place], [place] [address]     FURTHER DISCHARGE INSTRUCTIONS:  Get Medicines reviewed and adjusted: Please take all your medications with you for your next visit with your Primary MD  Laboratory/radiological data: Please request your Primary MD to go over all hospital tests and procedure/radiological results at the follow up, please ask your Primary MD to get all Hospital records sent to his/her office.  In some cases, they will be blood work, cultures and biopsy results pending at the time of your discharge. Please request that your primary care M.D. goes through all the records of your hospital data and follows up on these results.  Also Note the following: If you experience worsening of your admission symptoms, develop shortness of breath, life threatening emergency, suicidal or homicidal thoughts you must seek  medical attention immediately by calling 911 or calling your MD immediately  if symptoms less severe.  You must read complete instructions/literature along with all the possible adverse reactions/side effects for all the  Medicines you take and that have been prescribed to you. Take any new Medicines after you have completely understood and accpet all the possible adverse reactions/side effects.   Do not drive when taking Pain medications or sleeping medications (Benzodaizepines)  Do not take more than prescribed Pain, Sleep and Anxiety Medications. It is not advisable to combine anxiety,sleep and pain medications without talking with your primary care practitioner  Special Instructions: If you have smoked or chewed Tobacco  in the last 2 yrs please stop smoking, stop any regular Alcohol  and or any Recreational drug use.  Wear Seat belts while driving.  Please note: You were cared for by a hospitalist during your hospital stay. Once you are discharged, your primary care physician will handle any further medical issues. Please note that NO REFILLS for any discharge medications will be authorized once you are discharged, as it is imperative that you return to your primary care physician (or establish a relationship with a primary care physician if you do not have one) for your post hospital discharge needs so that they can reassess your need for medications and monitor your lab values.  Total Time spent coordinating discharge including counseling, education and face to face time equals 35 minutes.  SignedJeoffrey Massed 08/22/2020 10:34 AM

## 2020-09-18 DEATH — deceased

## 2021-06-17 IMAGING — CT CT HEAD W/O CM
4 series · 17 of 47 positions shown, 19 images · non-contrast
Comparison: None.

CLINICAL DATA: Head trauma

EXAM:
CT HEAD WITHOUT CONTRAST
TECHNIQUE: Contiguous axial images were obtained from the base of the skull
through the vertex without intravenous contrast.

[Series 3: head wo · axial · 0.50mm/px · z∈[+1060,+1175]mm · 7 of 31 slices shown, 9 images]
[im 4/31  brain]
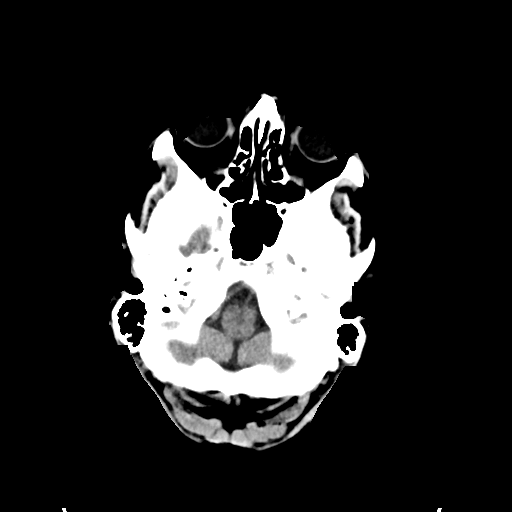
[im 4/31  bone]
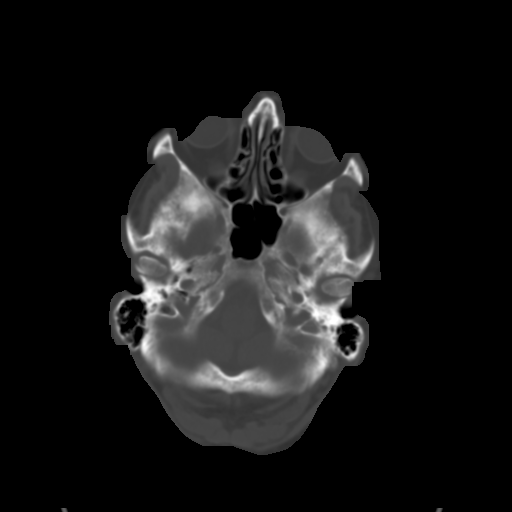
[im 8/31  brain]
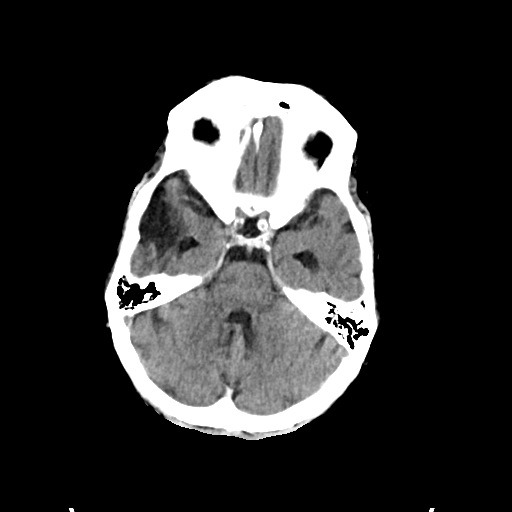
[im 12/31  brain]
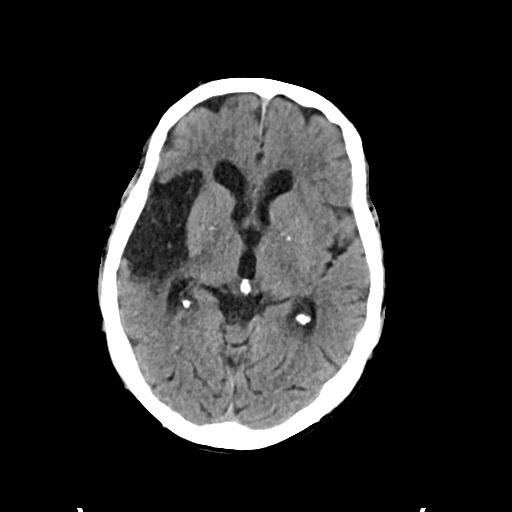
[im 16/31  brain]
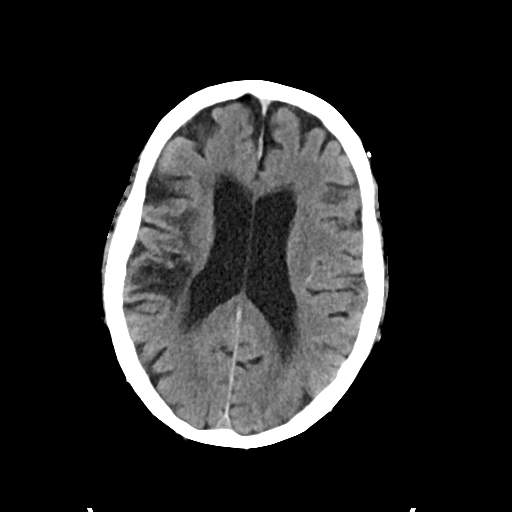
[im 19/31  brain]
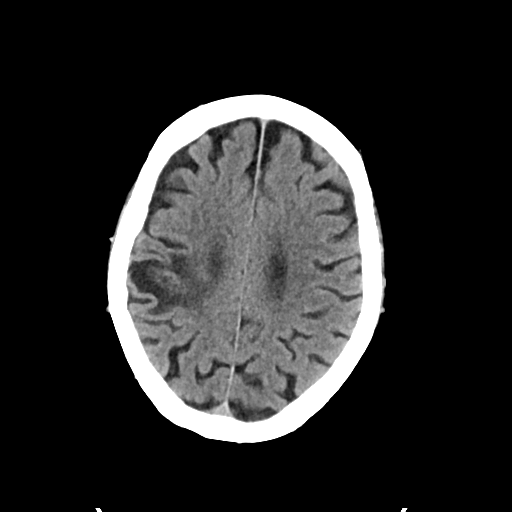
[im 19/31  bone]
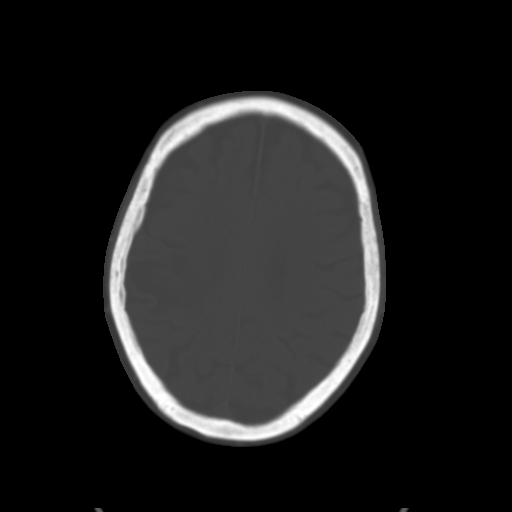
[im 23/31  brain]
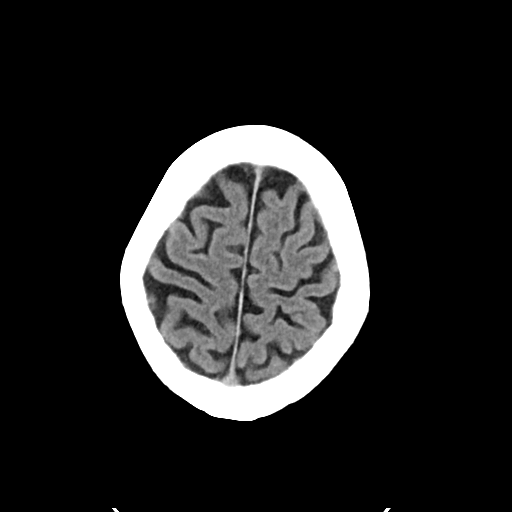
[im 27/31  brain]
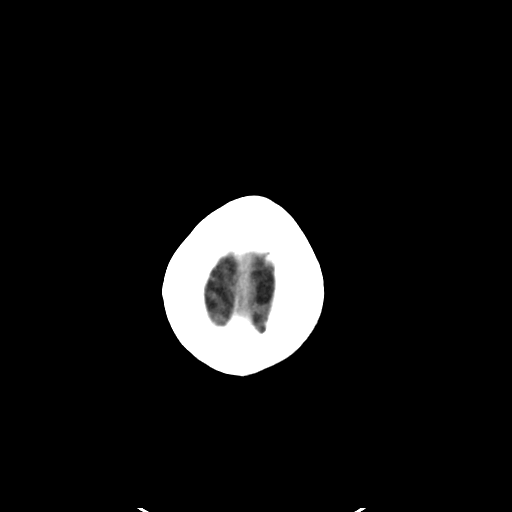

[Series 4: head bone · axial · 0.50mm/px · z∈[+1059,+1113]mm · 4 of 77 slices shown]
[im 8/77  bone]
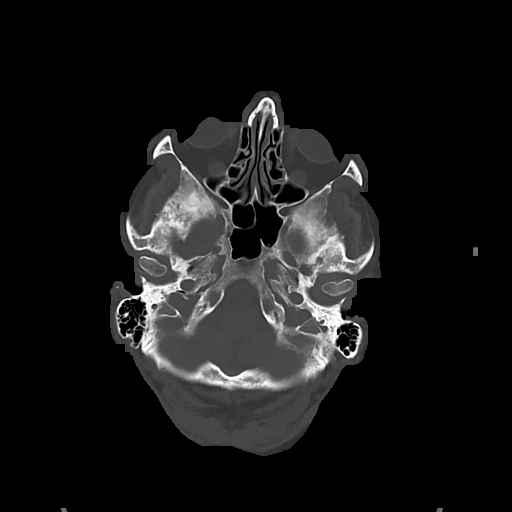
[im 16/77  bone]
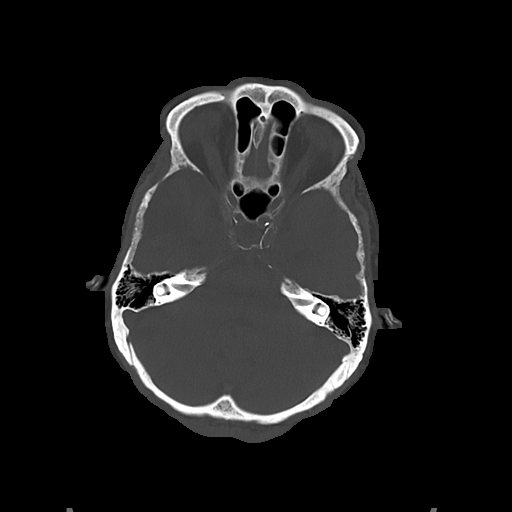
[im 23/77  bone]
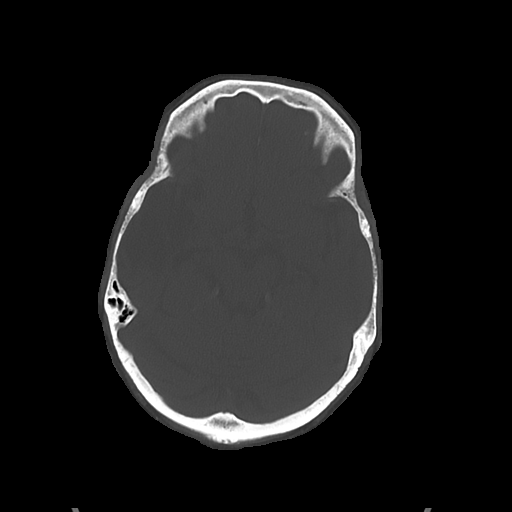
[im 35/77  bone]
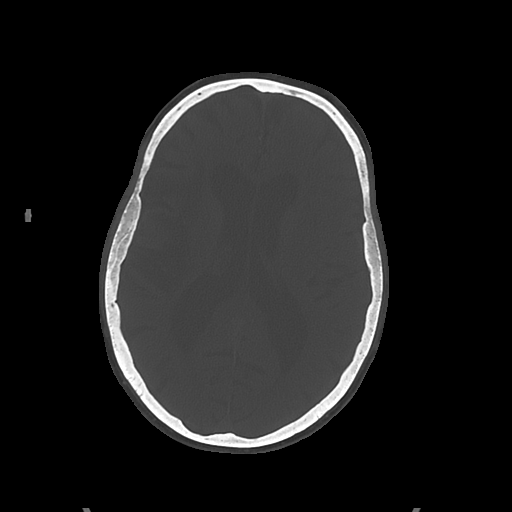

[Series 5: cor soft · coronal · 0.34mm/px · 3 of 72 slices shown]
[im 24/72  brain]
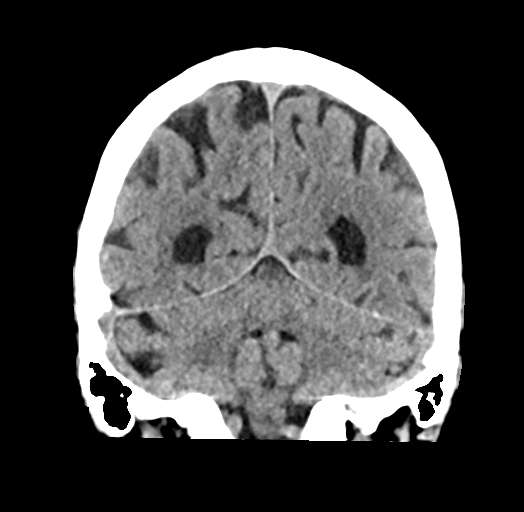
[im 32/72  brain]
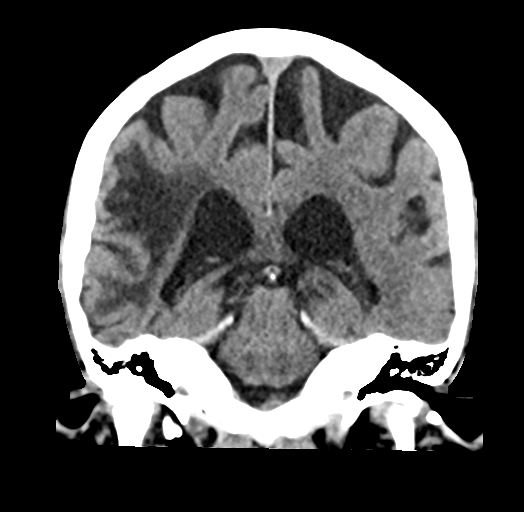
[im 40/72  brain]
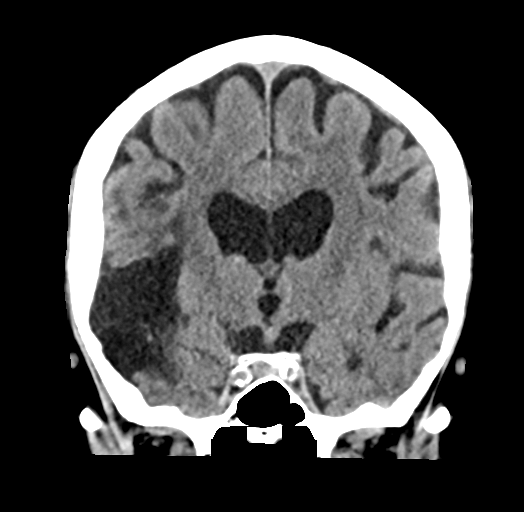

[Series 6: sag soft · sagittal · 0.32mm/px · 3 of 60 slices shown]
[im 20/60  brain]
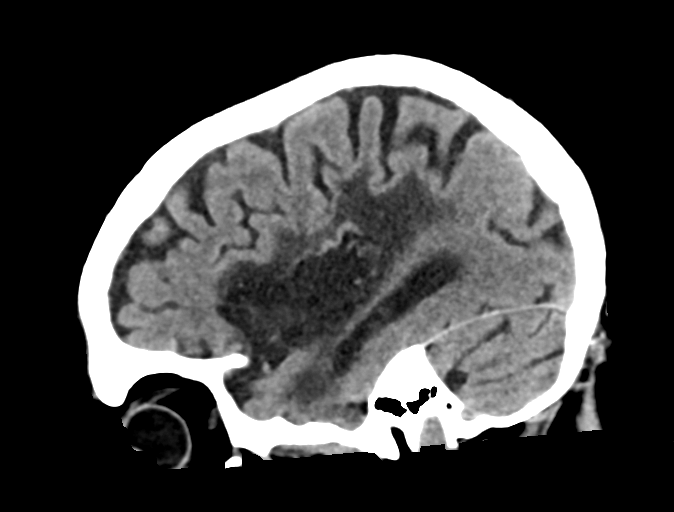
[im 30/60  brain]
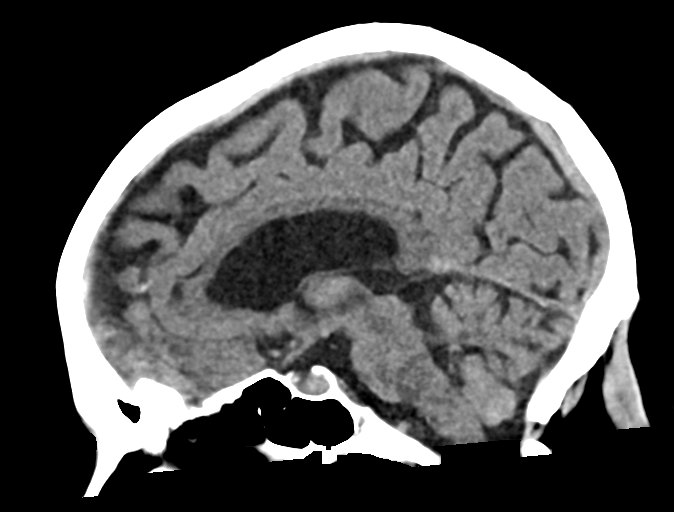
[im 40/60  brain]
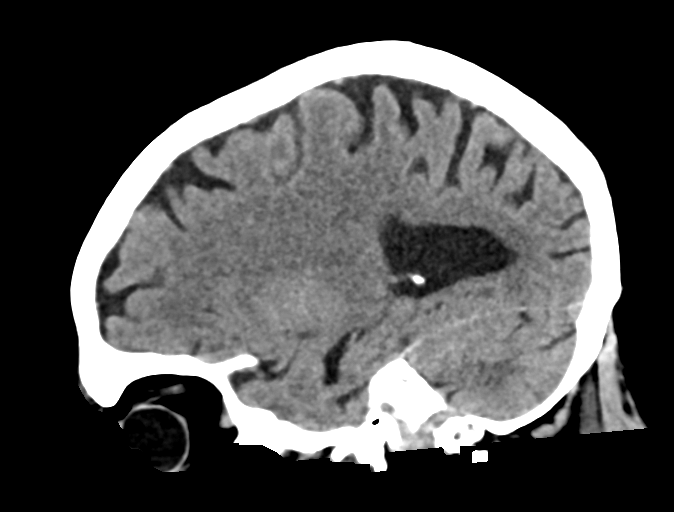

[17 of 47 positions shown; findings below may reference images not displayed]

FINDINGS: Brain: No acute territorial infarction, hemorrhage, or intracranial
mass. Encephalomalacia within the right posterior frontal, parietal
and temporal lobes consistent with chronic MCA infarct.
Encephalomalacia of the right insula. Mild ex vacuo dilatation of
right lateral ventricle. Mild atrophy.

Vascular: No hyperdense vessels.  Carotid vascular calcification

Skull: Normal. Negative for fracture or focal lesion.

Sinuses/Orbits: No acute finding.

Other: None
IMPRESSION: 1. No CT evidence for acute intracranial abnormality.
2. Chronic right MCA infarct.
3. Atrophy.

## 2021-07-11 IMAGING — DX DG CHEST 1V PORT
1 series · 1 of 1 positions shown · non-contrast
Comparison: 08/14/2016

CLINICAL DATA: Hypoxia

EXAM:
PORTABLE CHEST 1 VIEW

[chest ap]
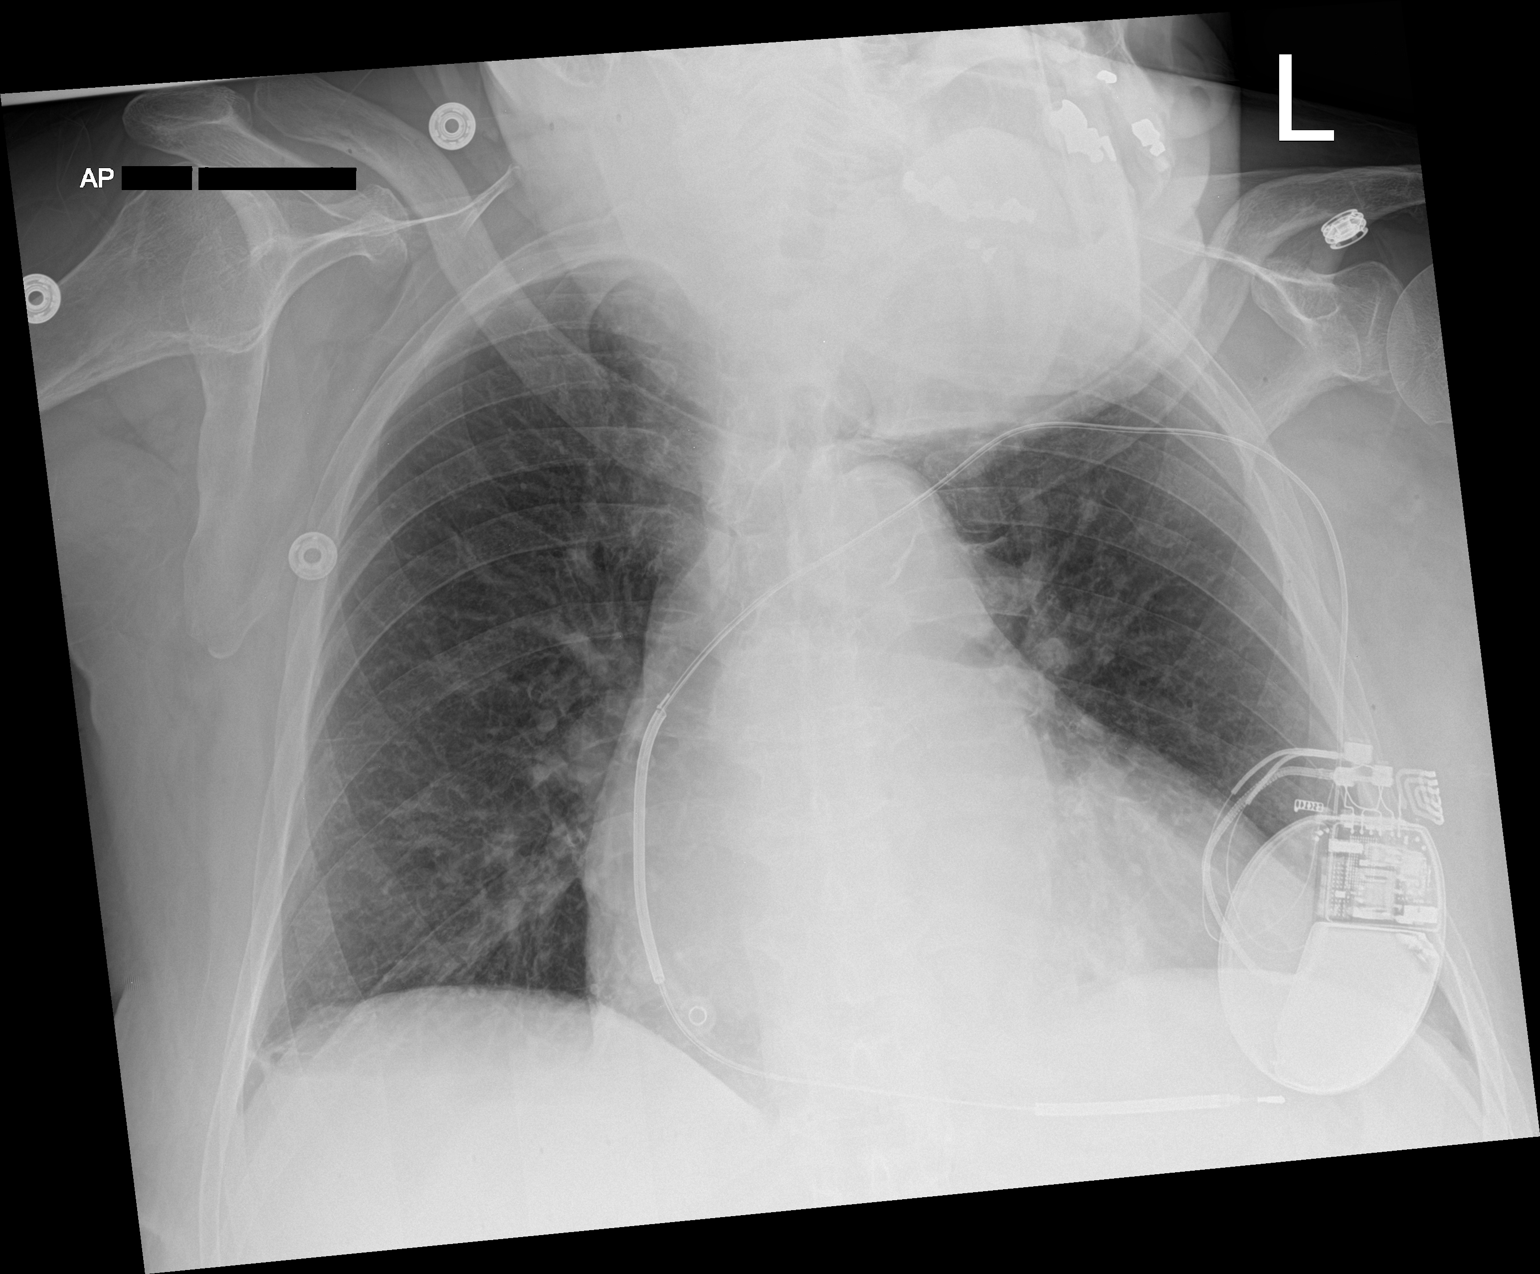

[1 of 1 positions shown; findings below may reference images not displayed]

FINDINGS: Mild bilateral interstitial thickening. No focal consolidation. No
pleural effusion or pneumothorax. Stable cardiomegaly. Single lead
cardiac pacemaker.

No acute osseous abnormality.
IMPRESSION: Cardiomegaly with mild pulmonary vascular congestion.

## 2021-08-12 IMAGING — DX DG CHEST 1V PORT
1 series · 1 of 1 positions shown · non-contrast
Comparison: Portable exam 1223 hours compared to 06/17/2020

CLINICAL DATA: Cough, code sepsis

EXAM:
PORTABLE CHEST 1 VIEW

[chest ap]
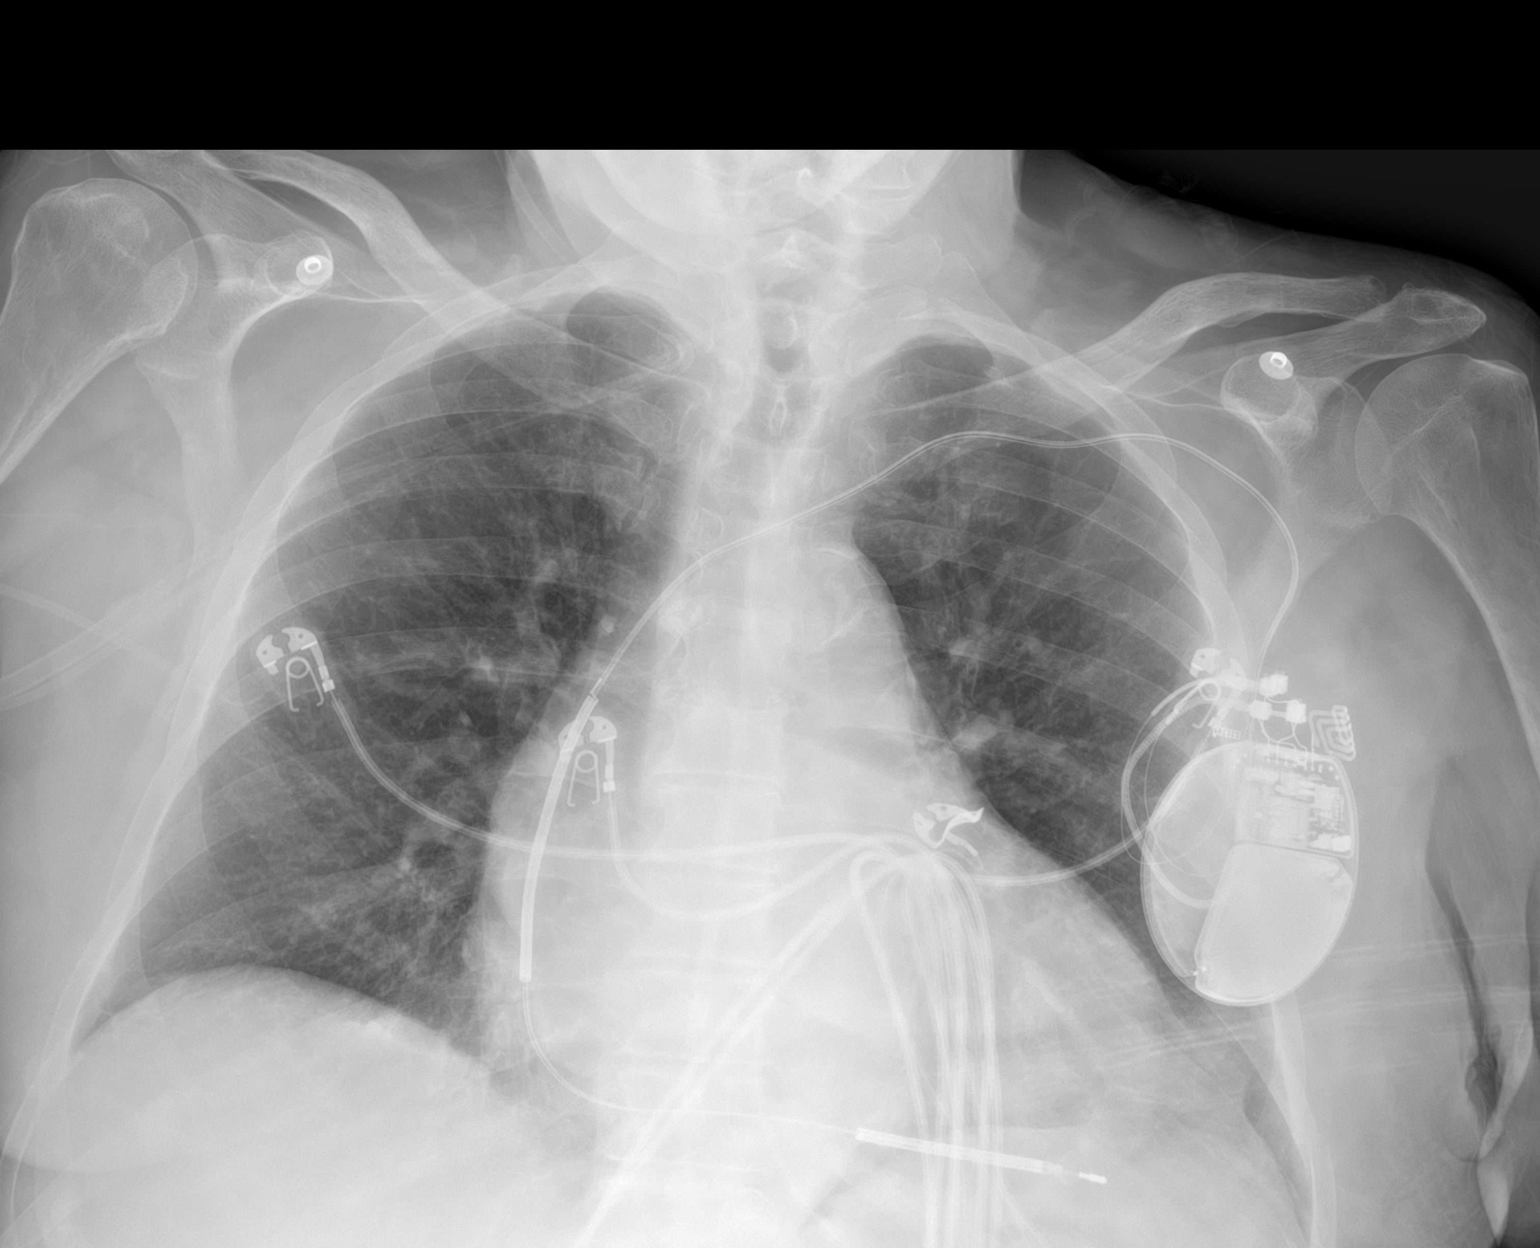

[1 of 1 positions shown; findings below may reference images not displayed]

FINDINGS: LEFT subclavian ICD with lead projecting over RIGHT ventricle.

Enlargement of cardiac silhouette with slight vascular congestion.

Atherosclerotic calcification aorta.

Lungs grossly clear.

Mild central peribronchial thickening again noted.

No definite acute infiltrate, pleural effusion or pneumothorax.

Bones demineralized.
IMPRESSION: Enlargement of cardiac silhouette post ICD.

Mild bronchitic changes without infiltrate.

## 2021-09-09 IMAGING — DX DG CHEST 1V PORT
1 series · 1 of 1 positions shown · non-contrast
Comparison: 07/19/2020

CLINICAL DATA: Change in mental status

EXAM:
PORTABLE CHEST 1 VIEW

[chest]
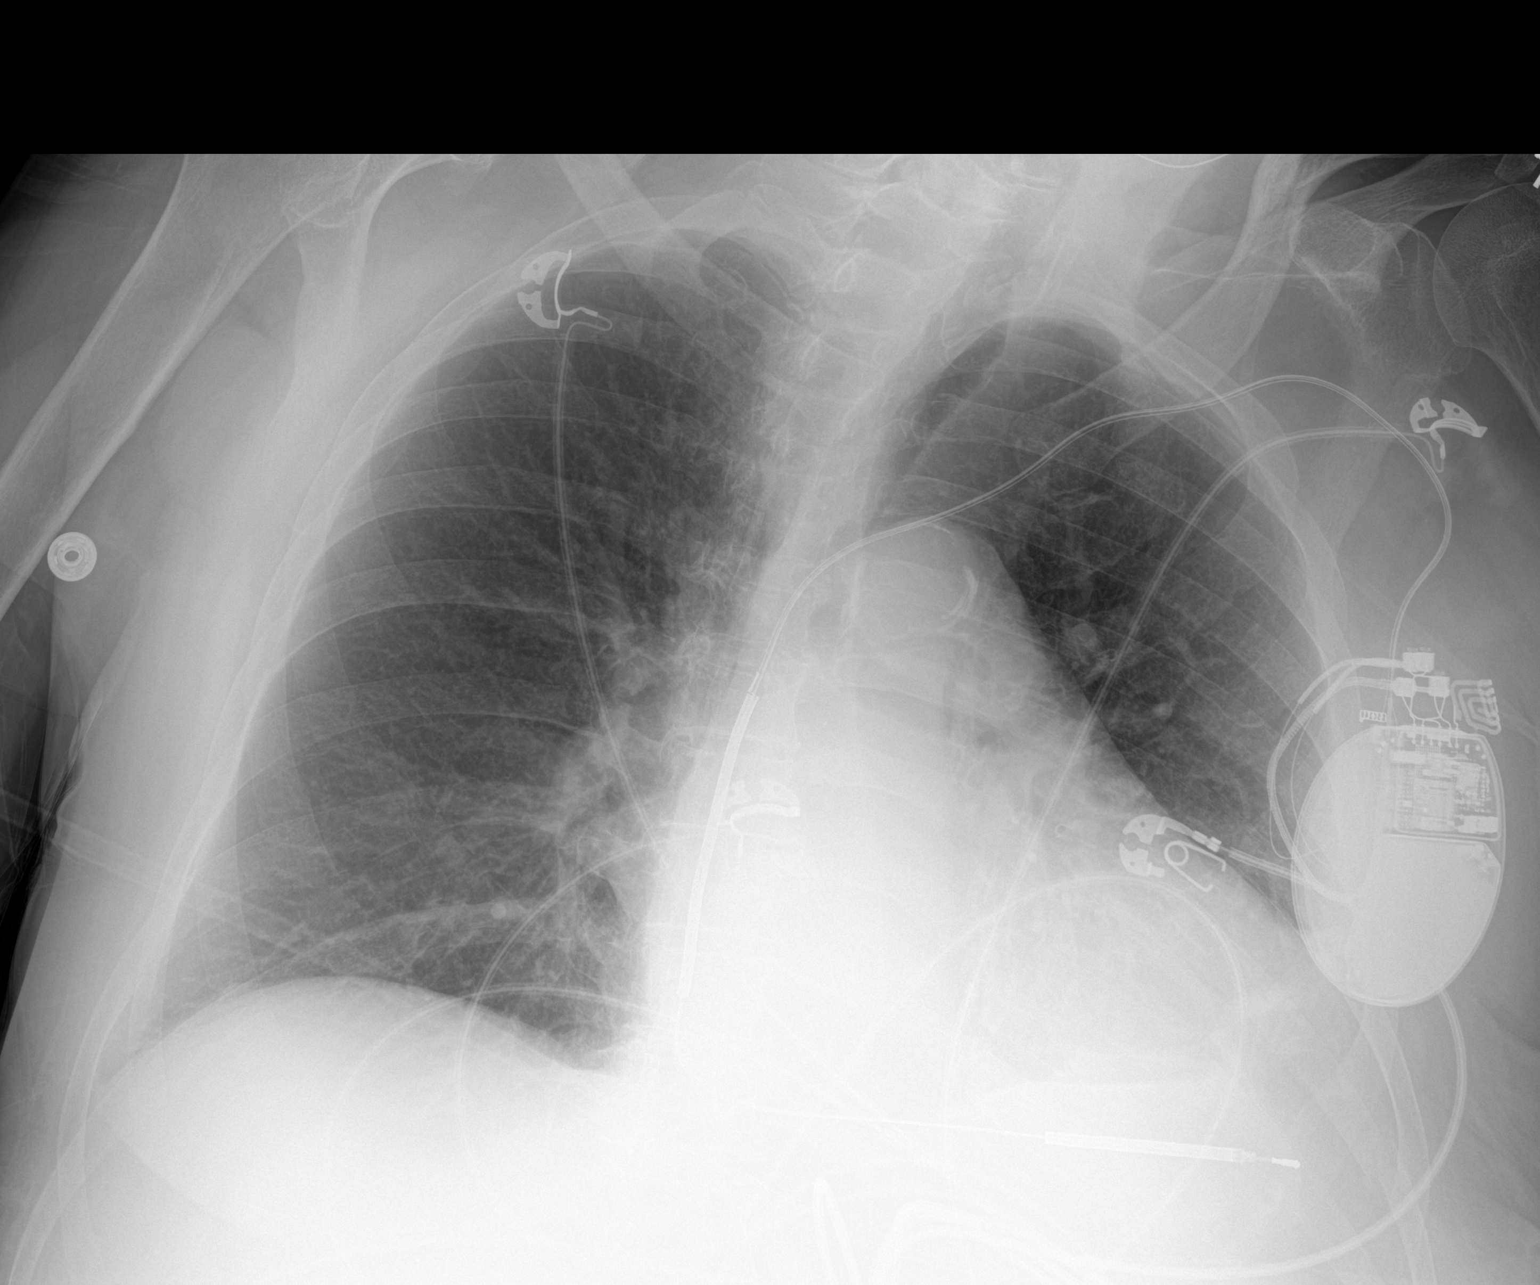

[1 of 1 positions shown; findings below may reference images not displayed]

FINDINGS: Single frontal view of the chest was obtained with the patient
rotated to the left. Single lead AICD unchanged. Cardiac silhouette
is stable. No airspace disease, effusion, or pneumothorax.
IMPRESSION: 1. No acute intrathoracic process.
